# Patient Record
Sex: Male | Born: 1940 | Race: White | Hispanic: No | Marital: Married | Smoking: Former smoker
Health system: Southern US, Community
[De-identification: ages and names within clinical notes are randomized; demographics above are authoritative.]

## PROBLEM LIST (undated history)

## (undated) DIAGNOSIS — D649 Anemia, unspecified: Secondary | ICD-10-CM

## (undated) DIAGNOSIS — I482 Chronic atrial fibrillation, unspecified: Secondary | ICD-10-CM

## (undated) DIAGNOSIS — I2510 Atherosclerotic heart disease of native coronary artery without angina pectoris: Secondary | ICD-10-CM

## (undated) DIAGNOSIS — M545 Low back pain, unspecified: Secondary | ICD-10-CM

## (undated) DIAGNOSIS — I509 Heart failure, unspecified: Secondary | ICD-10-CM

## (undated) DIAGNOSIS — I255 Ischemic cardiomyopathy: Secondary | ICD-10-CM

## (undated) DIAGNOSIS — N289 Disorder of kidney and ureter, unspecified: Secondary | ICD-10-CM

## (undated) DIAGNOSIS — I739 Peripheral vascular disease, unspecified: Secondary | ICD-10-CM

## (undated) DIAGNOSIS — E785 Hyperlipidemia, unspecified: Secondary | ICD-10-CM

## (undated) DIAGNOSIS — T8859XA Other complications of anesthesia, initial encounter: Secondary | ICD-10-CM

## (undated) DIAGNOSIS — T4145XA Adverse effect of unspecified anesthetic, initial encounter: Secondary | ICD-10-CM

## (undated) DIAGNOSIS — R319 Hematuria, unspecified: Secondary | ICD-10-CM

## (undated) DIAGNOSIS — G4733 Obstructive sleep apnea (adult) (pediatric): Secondary | ICD-10-CM

## (undated) DIAGNOSIS — IMO0001 Reserved for inherently not codable concepts without codable children: Secondary | ICD-10-CM

## (undated) DIAGNOSIS — E119 Type 2 diabetes mellitus without complications: Secondary | ICD-10-CM

## (undated) DIAGNOSIS — Z9981 Dependence on supplemental oxygen: Secondary | ICD-10-CM

## (undated) DIAGNOSIS — K259 Gastric ulcer, unspecified as acute or chronic, without hemorrhage or perforation: Secondary | ICD-10-CM

## (undated) DIAGNOSIS — R251 Tremor, unspecified: Secondary | ICD-10-CM

## (undated) DIAGNOSIS — I10 Essential (primary) hypertension: Secondary | ICD-10-CM

## (undated) DIAGNOSIS — M1990 Unspecified osteoarthritis, unspecified site: Secondary | ICD-10-CM

## (undated) DIAGNOSIS — K219 Gastro-esophageal reflux disease without esophagitis: Secondary | ICD-10-CM

## (undated) DIAGNOSIS — G8929 Other chronic pain: Secondary | ICD-10-CM

## (undated) DIAGNOSIS — Z950 Presence of cardiac pacemaker: Secondary | ICD-10-CM

## (undated) DIAGNOSIS — I219 Acute myocardial infarction, unspecified: Secondary | ICD-10-CM

## (undated) DIAGNOSIS — R0989 Other specified symptoms and signs involving the circulatory and respiratory systems: Secondary | ICD-10-CM

## (undated) DIAGNOSIS — Z9989 Dependence on other enabling machines and devices: Secondary | ICD-10-CM

## (undated) DIAGNOSIS — R0602 Shortness of breath: Secondary | ICD-10-CM

## (undated) DIAGNOSIS — IMO0002 Reserved for concepts with insufficient information to code with codable children: Secondary | ICD-10-CM

## (undated) DIAGNOSIS — Z5189 Encounter for other specified aftercare: Secondary | ICD-10-CM

## (undated) DIAGNOSIS — J449 Chronic obstructive pulmonary disease, unspecified: Secondary | ICD-10-CM

## (undated) DIAGNOSIS — I442 Atrioventricular block, complete: Secondary | ICD-10-CM

## (undated) HISTORY — DX: Atrioventricular block, complete: I44.2

## (undated) HISTORY — DX: Ischemic cardiomyopathy: I25.5

## (undated) HISTORY — DX: Acute myocardial infarction, unspecified: I21.9

## (undated) HISTORY — PX: CORONARY ANGIOPLASTY: SHX604

## (undated) HISTORY — PX: CATARACT EXTRACTION W/ INTRAOCULAR LENS  IMPLANT, BILATERAL: SHX1307

## (undated) HISTORY — DX: Essential (primary) hypertension: I10

## (undated) HISTORY — DX: Hyperlipidemia, unspecified: E78.5

## (undated) HISTORY — DX: Other specified symptoms and signs involving the circulatory and respiratory systems: R09.89

## (undated) HISTORY — PX: CORONARY ANGIOPLASTY WITH STENT PLACEMENT: SHX49

## (undated) HISTORY — PX: CORONARY STENT PLACEMENT: SHX1402

## (undated) HISTORY — PX: TONSILLECTOMY: SUR1361

## (undated) HISTORY — PX: HEMORRHOID SURGERY: SHX153

## (undated) HISTORY — PX: BACK SURGERY: SHX140

## (undated) HISTORY — PX: LUMBAR DISC SURGERY: SHX700

## (undated) HISTORY — DX: Peripheral vascular disease, unspecified: I73.9

---
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
## 1979-03-27 DIAGNOSIS — K259 Gastric ulcer, unspecified as acute or chronic, without hemorrhage or perforation: Secondary | ICD-10-CM

## 1979-03-27 HISTORY — DX: Gastric ulcer, unspecified as acute or chronic, without hemorrhage or perforation: K25.9

## 1989-03-26 HISTORY — PX: LUMBAR LAMINECTOMY: SHX95

## 1998-02-11 ENCOUNTER — Inpatient Hospital Stay (HOSPITAL_COMMUNITY): Admission: EM | Admit: 1998-02-11 | Discharge: 1998-02-12 | Payer: Self-pay | Admitting: Emergency Medicine

## 1998-02-13 ENCOUNTER — Ambulatory Visit (HOSPITAL_COMMUNITY): Admission: RE | Admit: 1998-02-13 | Discharge: 1998-02-13 | Payer: Self-pay | Admitting: Cardiology

## 1998-02-17 ENCOUNTER — Observation Stay (HOSPITAL_COMMUNITY): Admission: AD | Admit: 1998-02-17 | Discharge: 1998-02-18 | Payer: Self-pay | Admitting: Cardiology

## 1998-05-21 ENCOUNTER — Ambulatory Visit (HOSPITAL_COMMUNITY): Admission: RE | Admit: 1998-05-21 | Discharge: 1998-05-21 | Payer: Self-pay | Admitting: Neurosurgery

## 1998-05-21 ENCOUNTER — Encounter: Payer: Self-pay | Admitting: Neurosurgery

## 1998-06-17 ENCOUNTER — Other Ambulatory Visit: Admission: RE | Admit: 1998-06-17 | Discharge: 1998-06-17 | Payer: Self-pay | Admitting: Gastroenterology

## 1998-07-29 ENCOUNTER — Encounter: Payer: Self-pay | Admitting: Neurosurgery

## 1998-07-31 ENCOUNTER — Encounter: Payer: Self-pay | Admitting: Neurosurgery

## 1998-07-31 ENCOUNTER — Inpatient Hospital Stay (HOSPITAL_COMMUNITY): Admission: RE | Admit: 1998-07-31 | Discharge: 1998-08-02 | Payer: Self-pay | Admitting: Neurosurgery

## 1999-03-24 ENCOUNTER — Encounter: Payer: Self-pay | Admitting: Cardiology

## 1999-03-24 ENCOUNTER — Ambulatory Visit (HOSPITAL_COMMUNITY): Admission: RE | Admit: 1999-03-24 | Discharge: 1999-03-24 | Payer: Self-pay | Admitting: Neurosurgery

## 1999-04-22 ENCOUNTER — Encounter: Admission: RE | Admit: 1999-04-22 | Discharge: 1999-07-21 | Payer: Self-pay | Admitting: Anesthesiology

## 1999-08-03 ENCOUNTER — Encounter: Admission: RE | Admit: 1999-08-03 | Discharge: 1999-10-30 | Payer: Self-pay | Admitting: Anesthesiology

## 1999-10-02 ENCOUNTER — Encounter: Admission: RE | Admit: 1999-10-02 | Discharge: 1999-10-02 | Payer: Self-pay | Admitting: Cardiology

## 1999-10-02 ENCOUNTER — Encounter: Payer: Self-pay | Admitting: Cardiology

## 1999-10-05 ENCOUNTER — Ambulatory Visit (HOSPITAL_COMMUNITY): Admission: RE | Admit: 1999-10-05 | Discharge: 1999-10-06 | Payer: Self-pay | Admitting: Cardiology

## 1999-10-14 ENCOUNTER — Encounter: Admission: RE | Admit: 1999-10-14 | Discharge: 2000-01-12 | Payer: Self-pay | Admitting: Internal Medicine

## 1999-10-30 ENCOUNTER — Encounter: Admission: RE | Admit: 1999-10-30 | Discharge: 2000-01-19 | Payer: Self-pay | Admitting: Anesthesiology

## 2000-01-19 ENCOUNTER — Encounter: Admission: RE | Admit: 2000-01-19 | Discharge: 2000-04-18 | Payer: Self-pay | Admitting: Internal Medicine

## 2000-01-22 ENCOUNTER — Encounter: Admission: RE | Admit: 2000-01-22 | Discharge: 2000-02-08 | Payer: Self-pay | Admitting: Anesthesiology

## 2000-04-27 ENCOUNTER — Encounter: Payer: Self-pay | Admitting: Cardiology

## 2000-04-27 ENCOUNTER — Encounter: Admission: RE | Admit: 2000-04-27 | Discharge: 2000-04-27 | Payer: Self-pay | Admitting: Cardiology

## 2000-05-02 ENCOUNTER — Ambulatory Visit (HOSPITAL_COMMUNITY): Admission: RE | Admit: 2000-05-02 | Discharge: 2000-05-02 | Payer: Self-pay | Admitting: Cardiology

## 2001-01-31 ENCOUNTER — Ambulatory Visit (HOSPITAL_COMMUNITY): Admission: RE | Admit: 2001-01-31 | Discharge: 2001-01-31 | Payer: Self-pay | Admitting: Neurosurgery

## 2001-01-31 ENCOUNTER — Encounter: Payer: Self-pay | Admitting: Neurosurgery

## 2001-07-03 ENCOUNTER — Encounter: Payer: Self-pay | Admitting: Emergency Medicine

## 2001-07-03 ENCOUNTER — Encounter (INDEPENDENT_AMBULATORY_CARE_PROVIDER_SITE_OTHER): Payer: Self-pay | Admitting: *Deleted

## 2001-07-03 ENCOUNTER — Inpatient Hospital Stay (HOSPITAL_COMMUNITY): Admission: EM | Admit: 2001-07-03 | Discharge: 2001-07-06 | Payer: Self-pay | Admitting: Emergency Medicine

## 2001-07-05 ENCOUNTER — Encounter: Payer: Self-pay | Admitting: Internal Medicine

## 2002-06-14 ENCOUNTER — Ambulatory Visit (HOSPITAL_BASED_OUTPATIENT_CLINIC_OR_DEPARTMENT_OTHER): Admission: RE | Admit: 2002-06-14 | Discharge: 2002-06-14 | Payer: Self-pay | Admitting: Orthopedic Surgery

## 2002-06-14 ENCOUNTER — Encounter (INDEPENDENT_AMBULATORY_CARE_PROVIDER_SITE_OTHER): Payer: Self-pay | Admitting: Specialist

## 2002-10-03 ENCOUNTER — Inpatient Hospital Stay (HOSPITAL_COMMUNITY): Admission: EM | Admit: 2002-10-03 | Discharge: 2002-10-07 | Payer: Self-pay | Admitting: Emergency Medicine

## 2002-10-03 ENCOUNTER — Encounter: Payer: Self-pay | Admitting: Emergency Medicine

## 2003-01-04 ENCOUNTER — Ambulatory Visit (HOSPITAL_COMMUNITY): Admission: RE | Admit: 2003-01-04 | Discharge: 2003-01-04 | Payer: Self-pay | Admitting: Cardiology

## 2003-03-13 ENCOUNTER — Encounter (INDEPENDENT_AMBULATORY_CARE_PROVIDER_SITE_OTHER): Payer: Self-pay | Admitting: *Deleted

## 2003-03-13 ENCOUNTER — Ambulatory Visit (HOSPITAL_COMMUNITY): Admission: RE | Admit: 2003-03-13 | Discharge: 2003-03-13 | Payer: Self-pay | Admitting: Gastroenterology

## 2003-07-01 ENCOUNTER — Encounter: Admission: RE | Admit: 2003-07-01 | Discharge: 2003-07-01 | Payer: Self-pay | Admitting: Cardiology

## 2003-07-04 ENCOUNTER — Ambulatory Visit (HOSPITAL_COMMUNITY): Admission: RE | Admit: 2003-07-04 | Discharge: 2003-07-04 | Payer: Self-pay | Admitting: Cardiology

## 2003-07-05 ENCOUNTER — Ambulatory Visit (HOSPITAL_COMMUNITY): Admission: RE | Admit: 2003-07-05 | Discharge: 2003-07-05 | Payer: Self-pay | Admitting: Cardiology

## 2004-09-11 ENCOUNTER — Ambulatory Visit (HOSPITAL_COMMUNITY): Admission: RE | Admit: 2004-09-11 | Discharge: 2004-09-11 | Payer: Self-pay | Admitting: Orthopedic Surgery

## 2004-11-11 ENCOUNTER — Inpatient Hospital Stay (HOSPITAL_COMMUNITY): Admission: EM | Admit: 2004-11-11 | Discharge: 2004-11-17 | Payer: Self-pay | Admitting: Emergency Medicine

## 2004-11-13 HISTORY — PX: TRANSESOPHAGEAL ECHOCARDIOGRAM: SHX273

## 2004-12-14 ENCOUNTER — Ambulatory Visit (HOSPITAL_COMMUNITY): Admission: RE | Admit: 2004-12-14 | Discharge: 2004-12-14 | Payer: Self-pay | Admitting: Cardiology

## 2006-07-21 ENCOUNTER — Inpatient Hospital Stay (HOSPITAL_COMMUNITY): Admission: EM | Admit: 2006-07-21 | Discharge: 2006-07-23 | Payer: Self-pay | Admitting: Emergency Medicine

## 2008-02-01 ENCOUNTER — Ambulatory Visit: Payer: Self-pay | Admitting: Emergency Medicine

## 2008-02-01 DIAGNOSIS — G4733 Obstructive sleep apnea (adult) (pediatric): Secondary | ICD-10-CM

## 2008-02-01 DIAGNOSIS — I10 Essential (primary) hypertension: Secondary | ICD-10-CM | POA: Insufficient documentation

## 2008-02-01 DIAGNOSIS — R0602 Shortness of breath: Secondary | ICD-10-CM

## 2008-02-01 DIAGNOSIS — E785 Hyperlipidemia, unspecified: Secondary | ICD-10-CM

## 2008-03-15 ENCOUNTER — Ambulatory Visit: Payer: Self-pay | Admitting: Emergency Medicine

## 2008-03-15 ENCOUNTER — Ambulatory Visit (HOSPITAL_BASED_OUTPATIENT_CLINIC_OR_DEPARTMENT_OTHER): Admission: RE | Admit: 2008-03-15 | Discharge: 2008-03-15 | Payer: Self-pay | Admitting: Orthopedic Surgery

## 2008-04-24 ENCOUNTER — Ambulatory Visit: Payer: Self-pay | Admitting: Emergency Medicine

## 2008-04-24 DIAGNOSIS — J439 Emphysema, unspecified: Secondary | ICD-10-CM

## 2008-07-11 ENCOUNTER — Encounter: Admission: RE | Admit: 2008-07-11 | Discharge: 2008-07-11 | Payer: Self-pay | Admitting: Endocrinology

## 2008-07-26 DIAGNOSIS — I442 Atrioventricular block, complete: Secondary | ICD-10-CM

## 2008-07-26 DIAGNOSIS — Z950 Presence of cardiac pacemaker: Secondary | ICD-10-CM

## 2008-07-26 HISTORY — DX: Atrioventricular block, complete: I44.2

## 2008-07-26 HISTORY — DX: Presence of cardiac pacemaker: Z95.0

## 2008-07-26 HISTORY — PX: INSERT / REPLACE / REMOVE PACEMAKER: SUR710

## 2008-07-30 ENCOUNTER — Inpatient Hospital Stay (HOSPITAL_COMMUNITY): Admission: EM | Admit: 2008-07-30 | Discharge: 2008-08-04 | Payer: Self-pay | Admitting: Emergency Medicine

## 2008-07-30 HISTORY — PX: CARDIAC CATHETERIZATION: SHX172

## 2008-09-02 ENCOUNTER — Ambulatory Visit (HOSPITAL_COMMUNITY): Admission: RE | Admit: 2008-09-02 | Discharge: 2008-09-02 | Payer: Self-pay | Admitting: Cardiology

## 2008-09-12 ENCOUNTER — Encounter (HOSPITAL_COMMUNITY): Admission: RE | Admit: 2008-09-12 | Discharge: 2008-12-11 | Payer: Self-pay | Admitting: Cardiology

## 2008-11-09 ENCOUNTER — Ambulatory Visit: Payer: Self-pay | Admitting: Family Medicine

## 2008-11-09 DIAGNOSIS — L03319 Cellulitis of trunk, unspecified: Secondary | ICD-10-CM

## 2008-11-09 DIAGNOSIS — L02219 Cutaneous abscess of trunk, unspecified: Secondary | ICD-10-CM | POA: Insufficient documentation

## 2008-11-29 ENCOUNTER — Emergency Department (HOSPITAL_COMMUNITY): Admission: EM | Admit: 2008-11-29 | Discharge: 2008-11-29 | Payer: Self-pay | Admitting: Emergency Medicine

## 2008-12-12 ENCOUNTER — Encounter (HOSPITAL_COMMUNITY): Admission: RE | Admit: 2008-12-12 | Discharge: 2009-02-06 | Payer: Self-pay | Admitting: Cardiology

## 2009-04-23 ENCOUNTER — Inpatient Hospital Stay (HOSPITAL_COMMUNITY): Admission: AD | Admit: 2009-04-23 | Discharge: 2009-05-03 | Payer: Self-pay | Admitting: Urology

## 2009-04-29 ENCOUNTER — Encounter: Payer: Self-pay | Admitting: Urology

## 2009-04-29 DIAGNOSIS — I509 Heart failure, unspecified: Secondary | ICD-10-CM

## 2009-04-29 HISTORY — DX: Heart failure, unspecified: I50.9

## 2009-05-15 ENCOUNTER — Encounter: Admission: RE | Admit: 2009-05-15 | Discharge: 2009-05-15 | Payer: Self-pay | Admitting: Endocrinology

## 2009-08-12 ENCOUNTER — Ambulatory Visit (HOSPITAL_COMMUNITY): Admission: RE | Admit: 2009-08-12 | Discharge: 2009-08-12 | Payer: Self-pay | Admitting: Endocrinology

## 2009-09-11 ENCOUNTER — Telehealth: Payer: Self-pay | Admitting: Emergency Medicine

## 2009-09-12 ENCOUNTER — Ambulatory Visit: Payer: Self-pay | Admitting: Emergency Medicine

## 2009-09-12 DIAGNOSIS — J9610 Chronic respiratory failure, unspecified whether with hypoxia or hypercapnia: Secondary | ICD-10-CM

## 2009-09-15 ENCOUNTER — Encounter: Payer: Self-pay | Admitting: Emergency Medicine

## 2009-09-17 ENCOUNTER — Encounter: Payer: Self-pay | Admitting: Emergency Medicine

## 2009-09-18 ENCOUNTER — Encounter: Payer: Self-pay | Admitting: Emergency Medicine

## 2009-10-06 ENCOUNTER — Encounter: Payer: Self-pay | Admitting: Emergency Medicine

## 2009-10-14 ENCOUNTER — Ambulatory Visit: Payer: Self-pay | Admitting: Emergency Medicine

## 2009-10-14 DIAGNOSIS — J310 Chronic rhinitis: Secondary | ICD-10-CM

## 2009-11-03 ENCOUNTER — Encounter: Payer: Self-pay | Admitting: Emergency Medicine

## 2009-11-06 ENCOUNTER — Telehealth (INDEPENDENT_AMBULATORY_CARE_PROVIDER_SITE_OTHER): Payer: Self-pay | Admitting: *Deleted

## 2009-12-15 ENCOUNTER — Encounter: Payer: Self-pay | Admitting: Emergency Medicine

## 2009-12-17 ENCOUNTER — Encounter: Payer: Self-pay | Admitting: Emergency Medicine

## 2010-01-07 ENCOUNTER — Telehealth: Payer: Self-pay | Admitting: Emergency Medicine

## 2010-02-04 DIAGNOSIS — J449 Chronic obstructive pulmonary disease, unspecified: Secondary | ICD-10-CM

## 2010-02-04 HISTORY — DX: Chronic obstructive pulmonary disease, unspecified: J44.9

## 2010-02-16 ENCOUNTER — Encounter: Admission: RE | Admit: 2010-02-16 | Discharge: 2010-02-16 | Payer: Self-pay | Admitting: Cardiology

## 2010-07-23 DIAGNOSIS — R0989 Other specified symptoms and signs involving the circulatory and respiratory systems: Secondary | ICD-10-CM

## 2010-07-23 HISTORY — DX: Other specified symptoms and signs involving the circulatory and respiratory systems: R09.89

## 2010-08-17 ENCOUNTER — Encounter: Payer: Self-pay | Admitting: Emergency Medicine

## 2010-08-25 NOTE — Progress Notes (Signed)
Summary: cpap  Phone Note Call from Patient   Caller: [email or web address]  Call For: byrum Summary of Call: dr Tamela Oddi nurse called [facility] and stated pt complaining for pressure too high wants to get an order for 3day auto cpap download  Initial call taken by: Oneita Jolly,  November 06, 2009 10:13 AM  Follow-up for Phone Call        This is OK with me. order the auto-titration x 2 weeks and have the data sent to me. Thanks.  Follow-up by: Leslye Peer MD,  November 06, 2009 3:28 PM  Additional Follow-up for Phone Call Additional follow up Details #1::        order faxed to [facility] for pt to be on auto cpap x2wks with download    lori will you put me an order in pc box thanks Additional Follow-up by: Oneita Jolly,  November 06, 2009 4:15 PM    Additional Follow-up for Phone Call Additional follow up Details #2::    Order sent to [facility] for auto set with download.Michel Bickers [facility]  November 06, 2009 4:19 PM

## 2010-08-25 NOTE — Letter (Signed)
Summary: CMN/[facility]  CMN/[facility]   Imported By: Lester [place] 09/19/2009 10:04:31  _____________________________________________________________________  External Attachment:    Type:   Image     Comment:   External Document

## 2010-08-25 NOTE — Assessment & Plan Note (Signed)
Summary: chronic resp failure, COPD, OSA   Visit Type:  Follow-up Copy to:  Dr. Julieanne Manson Primary Provider/Referring Provider:  Dr. Corrin Parker  CC:  COPD. Sleep apnea. The patient was last seen 04-24-2008. He c/o increased sob and cough with dark mucus. Worse x3-4 weeks. He says he is wearing his CPAP during the day and at night..  History of Present Illness: 70 yo man, hx CAD now s/p pacer 07/2008, tobacco abuse, DM, OSA on CPAP.    ROV 09/12/09 -- Progressive dyspnea over several months time.Hasn't been seen since 2009. We tried changing Spiriva to foradil at that time, he isn't sure whether it helped him, not sure how reliably he took either med. Has not restarted smoking. Was just started on Brovana and ipratropium by Dr Dagoberto Ligas last week. he was also started on O2 at all times ([facility]). Wt 304 -> 321 LBS.   Current Medications (verified): 1)  Furosemide 40 Mg  Tabs (Furosemide) .Marland Kitchen.. 1 By Mouth Daily 2)  Warfarin Sodium 10 Mg  Tabs (Warfarin Sodium) .... Use As Directed 3)  Lisinopril 20 Mg  Tabs (Lisinopril) .Marland Kitchen.. 1 By Mouth Daily 4)  Simvastatin 80 Mg  Tabs (Simvastatin) .Marland Kitchen.. 1 By Mouth Daily 5)  Actos 45 Mg  Tabs (Pioglitazone Hcl) .Marland Kitchen.. 1 By Mouth Daily 6)  Celexa 40 Mg  Tabs (Citalopram Hydrobromide) .Marland Kitchen.. 1 By Mouth Daily 7)  Novolog Mix 70/30 70-30 %  Susp (Insulin Aspart Prot & Aspart) .... As Directed 8)  Adult Aspirin Ec Low Strength 81 Mg  Tbec (Aspirin) .Marland Kitchen.. 1 By Mouth Daily 9)  Cpap .... At Bedtime 10)  Oxygen 1l .... At Bedtime With Cpap 11)  Isosorbide Dinitrate 30 Mg Tabs (Isosorbide Dinitrate) .Marland Kitchen.. 1 By Mouth Daily 12)  Spironolactone 25 Mg Tabs (Spironolactone) .Marland Kitchen.. 1 By Mouth Daily 13)  Oxybutynin Chloride 5 Mg Tabs (Oxybutynin Chloride) .... 2 By Mouth Daily 14)  Coreg 3.125 Mg Tabs (Carvedilol) .Marland Kitchen.. 1 By Mouth Two Times A Day 15)  Spiriva Handihaler 18 Mcg Caps (Tiotropium Bromide Monohydrate) .... Out 16)  Multivitamins  Tabs (Multiple Vitamin) .Marland Kitchen.. 1 By  Mouth Daily 17)  Primidone 50 Mg Tabs (Primidone) .Marland Kitchen.. 1 By Mouth Daily 18)  Brovana 15 Mcg/67ml Nebu (Arformoterol Tartrate) .... Two Times A Day 19)  Ipratropium Bromide 0.02 % Soln (Ipratropium Bromide) .... Two Times A Day  Allergies (verified): 1)  ! Pcn  Review of Systems       The patient complains of shortness of breath with activity, shortness of breath at rest, productive cough, non-productive cough, chest pain, weight change, and nasal congestion/difficulty breathing through nose.  The patient denies coughing up blood, irregular heartbeats, loss of appetite, abdominal pain, difficulty swallowing, sore throat, tooth/dental problems, headaches, and sneezing.    Vital Signs:  Patient profile:   70 year old male Height:      69.5 inches (176.53 cm) Weight:      321 pounds (145.91 kg) BMI:     46.89 O2 Sat:      84 % on 2 L/min Temp:     97.5 degrees F (36.39 degrees C) oral Pulse rate:   79 / minute BP sitting:   132 / 70  (left arm) Cuff size:   large  Vitals Entered By: Michel Bickers CMA (September 12, 2009 9:18 AM)  O2 Sat at Rest %:  84 O2 Flow:  2 L/min  O2 Sat Comments After resting about 5 minutes the patients sats were up  to 91% on 2 liters.Michel Bickers [facility]  September 12, 2009 9:29 AM   Impression & Recommendations:  Problem # 1:  COPD (ICD-496) Recently changed to nebs (prior BD use has been unreliably). Clarified regimen with him today O2 titration to ensure adequate dosing rov 1 month or as needed   Problem # 2:  SLEEP APNEA (ICD-780.57) Consider future CPAP retitration given wt gain and progressive signs   Problem # 3:  CHRONIC RESPIRATORY FAILURE (ICD-518.83)  Medications Added to Medication List This Visit: 1)  Isosorbide Dinitrate 30 Mg Tabs (Isosorbide dinitrate) .Marland Kitchen.. 1 by mouth daily 2)  Spironolactone 25 Mg Tabs (Spironolactone) .Marland Kitchen.. 1 by mouth daily 3)  Oxybutynin Chloride 5 Mg Tabs (Oxybutynin chloride) .... 2 by mouth daily 4)  Coreg 3.125 Mg  Tabs (Carvedilol) .Marland Kitchen.. 1 by mouth two times a day 5)  Spiriva Handihaler 18 Mcg Caps (Tiotropium bromide monohydrate) .... Out 6)  Multivitamins Tabs (Multiple vitamin) .Marland Kitchen.. 1 by mouth daily 7)  Primidone 50 Mg Tabs (Primidone) .Marland Kitchen.. 1 by mouth daily 8)  Brovana 15 Mcg/57ml Nebu (Arformoterol tartrate) .... Two times a day 9)  Ipratropium Bromide 0.02 % Soln (Ipratropium bromide) .... Two times a day  Other Orders: Est. Patient Level V (81191) DME Referral (DME)  Patient Instructions: 1)  Stop Spiriva 2)  Continue to take Brovana two times a day  3)  Start taking ipratropium nebulizer 4x a day. 4)  Start using albuterol nebulizer up to every 4 hours if needed for shortness of breath (new orders sent to [facility]) 5)  We will ask [facility] to walk you to insure that you are on enough oxygen when you are exerting yourself.  6)  Start doing nasal saline washes once daily  7)  Try decongestant medications that contain either bromphenerimine or chlorphenerimine (over-the-counter) 8)  Follow up with Dr Delton Coombes in 1 month or as needed.

## 2010-08-25 NOTE — Progress Notes (Signed)
Summary: cpap orders  Phone Note Call from Patient   Caller: [email or web address]  Call For: Bruce Taylor  Summary of Call: needs order for cpap pressure from download Initial call taken by: Oneita Jolly,  January 07, 2010 12:03 PM  Follow-up for Phone Call        Entered CPAP order. Leslye Peer MD  January 08, 2010 12:07 PM  Follow-up by: Leslye Peer MD,  January 08, 2010 12:07 PM  Additional Follow-up for Phone Call Additional follow up Details #1::        order faxed to [facility] for cpap download  Additional Follow-up by: Oneita Jolly,  January 08, 2010 12:53 PM

## 2010-08-25 NOTE — Medication Information (Signed)
Summary: [facility]   Imported By: Lester [place] 09/19/2009 10:03:11  _____________________________________________________________________  External Attachment:    Type:   Image     Comment:   External Document

## 2010-08-25 NOTE — Assessment & Plan Note (Signed)
Summary: COPD, OSA   Visit Type:  Follow-up Copy to:  Dr. Julieanne Manson Primary Provider/Referring Provider:  Dr. Corrin Parker  CC:  1 month followup on OSA COPD and Chronic Respitory Failure.  Pt states that his breathing is much improved on o2.  Pt left side back pain "around lung" x 1 month- pt states that pain occurs when he moves a certain way.  Pt is currently on doxycycline per Dr Clarene Duke- "sinus trouble"..  History of Present Illness: 70 yo man, hx CAD now s/p pacer 07/2008, tobacco abuse, DM, OSA on CPAP.    ROV 09/12/09 -- Progressive dyspnea over several months time.Hasn't been seen since 2009. We tried changing Spiriva to foradil at that time, he isn't sure whether it helped him, not sure how reliably he took either med. Has not restarted smoking. Was just started on Brovana and ipratropium by Dr Dagoberto Ligas last week. he was also started on O2 at all times ([facility]). Wt 304 -> 321 LBS.   ROV 10/14/09 -- returns for follow up today. Last time we clarified his BDs: atrovent nebs qid + brovana. We also had O2 titration - . Has also seen Dr Clarene Duke, had his actos stopped, same diuretics. He was also given doxy for ? sinusitis/broinchitis. Nasal drainage is now clear, no blood. Has been using nasal saline as needed. Didn't try the OTC decongestants. Has been using brovana + atrovent, feels better. Good compliance with CPAP.   Current Medications (verified): 1)  Furosemide 40 Mg  Tabs (Furosemide) .Marland Kitchen.. 1 By Mouth Daily 2)  Warfarin Sodium 10 Mg  Tabs (Warfarin Sodium) .... Use As Directed 3)  Lisinopril 20 Mg  Tabs (Lisinopril) .Marland Kitchen.. 1 By Mouth Daily 4)  Simvastatin 80 Mg  Tabs (Simvastatin) .Marland Kitchen.. 1 By Mouth Daily 5)  Celexa 40 Mg  Tabs (Citalopram Hydrobromide) .Marland Kitchen.. 1 By Mouth Daily 6)  Novolog Mix 70/30 70-30 %  Susp (Insulin Aspart Prot & Aspart) .... As Directed 7)  Adult Aspirin Ec Low Strength 81 Mg  Tbec (Aspirin) .Marland Kitchen.. 1 By Mouth Daily 8)  Cpap .... At Bedtime 9)  Oxygen 1l .... At  Bedtime With Cpap 10)  Isosorbide Dinitrate 30 Mg Tabs (Isosorbide Dinitrate) .Marland Kitchen.. 1 By Mouth Daily 11)  Spironolactone 25 Mg Tabs (Spironolactone) .Marland Kitchen.. 1 By Mouth Daily 12)  Oxybutynin Chloride 5 Mg Tabs (Oxybutynin Chloride) .... 2 By Mouth Daily 13)  Coreg 3.125 Mg Tabs (Carvedilol) .Marland Kitchen.. 1 By Mouth Two Times A Day 14)  Multivitamins  Tabs (Multiple Vitamin) .Marland Kitchen.. 1 By Mouth Daily 15)  Primidone 50 Mg Tabs (Primidone) .Marland Kitchen.. 1 By Mouth Daily 16)  Brovana 15 Mcg/72ml Nebu (Arformoterol Tartrate) .... Two Times A Day 17)  Ipratropium Bromide 0.02 % Soln (Ipratropium Bromide) .... Two Times A Day  Allergies (verified): 1)  ! Pcn  Vital Signs:  Patient profile:   70 year old male Weight:      311 pounds O2 Sat:      96 % on 2 L/min Temp:     97.4 degrees F oral Pulse rate:   65 / minute BP sitting:   126 / 74  (left arm) Cuff size:   large  Vitals Entered By: Vernie Murders (October 14, 2009 3:26 PM)  O2 Flow:  2 L/min  Physical Exam  General:  well developed, obese, well nourished, in no acute distress Head:  normocephalic and atraumatic Eyes:  conjunctiva and sclera clear Nose:  no deformity, discharge, inflammation, or lesions Mouth:  no  deformity or lesions Neck:  no masses, thyromegaly, or abnormal cervical nodes Lungs:  no wheeze, no crackles Heart:  regular rate and rhythm, S1, S2 without murmurs, rubs, gallops, or clicks Abdomen:  protuberant, soft, NT, positive BS Msk:  no deformity or scoliosis noted with normal posture Extremities:  trace LE edema (better) Neurologic:  non-focal Skin:  somewhat ruddy complection, intact without lesions or rashes Psych:  alert and cooperative; normal mood and affect; normal attention span and concentration   Impression & Recommendations:  Problem # 1:  COPD (ICD-496) same BDs O2  Problem # 2:  SLEEP APNEA (ICD-780.57) Will ask [facility] to eval his CPAP fit.  Orders: Est. Patient Level III (16109) DME Referral (DME)  Problem #  3:  CHRONIC RHINITIS (ICD-472.0) nasal saline washes  Patient Instructions: 1)  Continue your nebulized medications as you are using them 2)  Continue nasal saline washes every day 3)  We will ask [facility] to check your CPAP, both to see if your mask fits well and to make sure you don't need repairs. It may be time for you to get a new mask.  4)  Follow up with Dr Delton Coombes in 3 months or as needed

## 2010-08-25 NOTE — Progress Notes (Signed)
Summary: tight chest/ breathing treatments  Phone Note Call from Patient   Caller: Patient Call For: byrum Summary of Call: pt's spouse marilyn requests to speak to nurse re: pt's tightness in chest/ SOB. says that pt's dr Leonette Most gegick prescribed breathing treatments for pt through [facility]. these are causing pt to become aggitated. i asked if she wanted to make an appt for pt w/ rb, but she wants to speak to nurse. i also advised that she may want to call dr Jacqulyn Liner (who pt sees for diabetes per spouse) since he prescribed the breathing treatments. pt also uses a cpap. marilyn Mineer [phone] Initial call taken by: Tivis Ringer, CNA,  September 11, 2009 10:34 AM  Follow-up for Phone Call        Pt spouse states that the pt is relly going "down hill." He has been placed on Oxygen 24/7 and brovana and pulmicort nebs all by Dr. Dagoberto Ligas, pt endocrinologists. Pt is aso on Cpap machine for apnea. Sh estates the breahting treatments aren't helping they are only making him agitated. I advised pt has not been seen since 2009 by Dr. Delton Coombes and he needs an appt to get re-established and to let RB treat the pt lung disease. Pt scheduled for 9am tomorrow. [name] aware.  Carron Curie CMA  September 11, 2009 11:32 AM

## 2010-08-25 NOTE — Letter (Signed)
Summary: CMN for Oxygen/[facility]  CMN for Oxygen/[facility]   Imported By: Sherian Rein 10/08/2009 13:41:15  _____________________________________________________________________  External Attachment:    Type:   Image     Comment:   External Document

## 2010-08-25 NOTE — Letter (Signed)
Summary: CMN/[facility]  CMN/[facility]   Imported By: Lester [place] 09/25/2009 09:42:51  _____________________________________________________________________  External Attachment:    Type:   Image     Comment:   External Document

## 2010-08-25 NOTE — Letter (Signed)
Summary: Revised CMN  Revised CMN   Imported By: Esmeralda Links D'jimraou 11/08/2009 11:24:01  _____________________________________________________________________  External Attachment:    Type:   Image     Comment:   External Document

## 2010-08-27 NOTE — Letter (Signed)
Summary: CMN for oxygen/[facility]  CMN for oxygen/[facility]   Imported By: Sherian Rein 08/20/2010 15:08:59  _____________________________________________________________________  External Attachment:    Type:   Image     Comment:   External Document

## 2010-09-02 ENCOUNTER — Encounter: Payer: Self-pay | Admitting: Emergency Medicine

## 2010-09-22 NOTE — Letter (Signed)
Summary: CMN/[facility]  CMN/[facility]   Imported By: Lester [place] 09/14/2010 09:33:29  _____________________________________________________________________  External Attachment:    Type:   Image     Comment:   External Document

## 2010-09-23 ENCOUNTER — Encounter: Payer: Self-pay | Admitting: Emergency Medicine

## 2010-10-06 NOTE — Medication Information (Signed)
Summary: Order for Nebulizer Supplies/[facility]  Order for Nebulizer Supplies/[facility]   Imported By: Lennie Odor 09/28/2010 13:49:30  _____________________________________________________________________  External Attachment:    Type:   Image     Comment:   External Document

## 2010-10-24 ENCOUNTER — Inpatient Hospital Stay (HOSPITAL_COMMUNITY)
Admission: EM | Admit: 2010-10-24 | Discharge: 2010-10-28 | DRG: 249 | Disposition: A | Discharge status: Home / Self Care | Payer: Medicare Other | Attending: Cardiology | Admitting: Cardiology

## 2010-10-24 ENCOUNTER — Emergency Department (HOSPITAL_COMMUNITY): Payer: Medicare Other

## 2010-10-24 DIAGNOSIS — E119 Type 2 diabetes mellitus without complications: Secondary | ICD-10-CM | POA: Diagnosis present

## 2010-10-24 DIAGNOSIS — E785 Hyperlipidemia, unspecified: Secondary | ICD-10-CM | POA: Diagnosis present

## 2010-10-24 DIAGNOSIS — I2510 Atherosclerotic heart disease of native coronary artery without angina pectoris: Secondary | ICD-10-CM | POA: Diagnosis present

## 2010-10-24 DIAGNOSIS — I4891 Unspecified atrial fibrillation: Secondary | ICD-10-CM | POA: Diagnosis present

## 2010-10-24 DIAGNOSIS — Z9981 Dependence on supplemental oxygen: Secondary | ICD-10-CM

## 2010-10-24 DIAGNOSIS — J4489 Other specified chronic obstructive pulmonary disease: Secondary | ICD-10-CM | POA: Diagnosis present

## 2010-10-24 DIAGNOSIS — I129 Hypertensive chronic kidney disease with stage 1 through stage 4 chronic kidney disease, or unspecified chronic kidney disease: Secondary | ICD-10-CM | POA: Diagnosis present

## 2010-10-24 DIAGNOSIS — Z950 Presence of cardiac pacemaker: Secondary | ICD-10-CM

## 2010-10-24 DIAGNOSIS — M79609 Pain in unspecified limb: Secondary | ICD-10-CM

## 2010-10-24 DIAGNOSIS — G4733 Obstructive sleep apnea (adult) (pediatric): Secondary | ICD-10-CM | POA: Diagnosis present

## 2010-10-24 DIAGNOSIS — J449 Chronic obstructive pulmonary disease, unspecified: Secondary | ICD-10-CM | POA: Diagnosis present

## 2010-10-24 DIAGNOSIS — N183 Chronic kidney disease, stage 3 unspecified: Secondary | ICD-10-CM | POA: Diagnosis present

## 2010-10-24 DIAGNOSIS — I200 Unstable angina: Principal | ICD-10-CM | POA: Diagnosis present

## 2010-10-24 DIAGNOSIS — Z880 Allergy status to penicillin: Secondary | ICD-10-CM

## 2010-10-24 LAB — URINALYSIS, ROUTINE W REFLEX MICROSCOPIC
Hgb urine dipstick: NEGATIVE
Protein, ur: NEGATIVE mg/dL
Urobilinogen, UA: 0.2 mg/dL (ref 0.0–1.0)

## 2010-10-24 LAB — COMPREHENSIVE METABOLIC PANEL
BUN: 37 mg/dL — ABNORMAL HIGH (ref 6–23)
CO2: 23 mEq/L (ref 19–32)
Chloride: 104 mEq/L (ref 96–112)
Creatinine, Ser: 1.79 mg/dL — ABNORMAL HIGH (ref 0.4–1.5)
GFR calc non Af Amer: 38 mL/min — ABNORMAL LOW (ref >60)
Total Bilirubin: 0.4 mg/dL (ref 0.3–1.2)

## 2010-10-24 LAB — CBC
HCT: 37.2 % — ABNORMAL LOW (ref 39.0–52.0)
Hemoglobin: 12.3 g/dL — ABNORMAL LOW (ref 13.0–17.0)
MCH: 29.6 pg (ref 26.0–34.0)
MCH: 29.9 pg (ref 26.0–34.0)
RBC: 3.95 MIL/uL — ABNORMAL LOW (ref 4.22–5.81)
RDW: 14.7 % (ref 11.5–15.5)
WBC: 5.8 10*3/uL (ref 4.0–10.5)
WBC: 6.3 10*3/uL (ref 4.0–10.5)

## 2010-10-24 LAB — BASIC METABOLIC PANEL
BUN: 37 mg/dL — ABNORMAL HIGH (ref 6–23)
Calcium: 8.9 mg/dL (ref 8.4–10.5)
Creatinine, Ser: 1.88 mg/dL — ABNORMAL HIGH (ref 0.4–1.5)

## 2010-10-24 LAB — PROTIME-INR
INR: 2.3 — ABNORMAL HIGH (ref 0.00–1.49)
Prothrombin Time: 25.4 seconds — ABNORMAL HIGH (ref 11.6–15.2)

## 2010-10-24 LAB — TROPONIN I
Troponin I: 0.02 ng/mL (ref 0.00–0.06)
Troponin I: 0.04 ng/mL (ref 0.00–0.06)

## 2010-10-24 LAB — GLUCOSE, CAPILLARY: Glucose-Capillary: 212 mg/dL — ABNORMAL HIGH (ref 70–99)

## 2010-10-24 LAB — CK TOTAL AND CKMB (NOT AT ARMC)
CK, MB: 2 ng/mL (ref 0.3–4.0)
Relative Index: 1.5 (ref 0.0–2.5)

## 2010-10-24 LAB — DIFFERENTIAL
Eosinophils Relative: 3 % (ref 0–5)
Monocytes Relative: 13 % — ABNORMAL HIGH (ref 3–12)
Neutro Abs: 3.6 10*3/uL (ref 1.7–7.7)
Neutrophils Relative %: 57 % (ref 43–77)

## 2010-10-24 LAB — HEMOGLOBIN A1C: Hgb A1c MFr Bld: 7.1 % — ABNORMAL HIGH (ref <5.7)

## 2010-10-24 LAB — TSH: TSH: 1.549 u[IU]/mL (ref 0.350–4.500)

## 2010-10-25 HISTORY — PX: CORONARY STENT PLACEMENT: SHX1402

## 2010-10-25 LAB — CARDIAC PANEL(CRET KIN+CKTOT+MB+TROPI): Troponin I: 0.02 ng/mL (ref 0.00–0.06)

## 2010-10-25 LAB — PROTIME-INR: INR: 2.01 — ABNORMAL HIGH (ref 0.00–1.49)

## 2010-10-25 LAB — HEPARIN LEVEL (UNFRACTIONATED): Heparin Unfractionated: 0.41 IU/mL (ref 0.30–0.70)

## 2010-10-25 LAB — BASIC METABOLIC PANEL
BUN: 30 mg/dL — ABNORMAL HIGH (ref 6–23)
Chloride: 102 mEq/L (ref 96–112)
Creatinine, Ser: 1.53 mg/dL — ABNORMAL HIGH (ref 0.4–1.5)
Glucose, Bld: 54 mg/dL — ABNORMAL LOW (ref 70–99)

## 2010-10-25 LAB — GLUCOSE, CAPILLARY
Glucose-Capillary: 160 mg/dL — ABNORMAL HIGH (ref 70–99)
Glucose-Capillary: 135 mg/dL — ABNORMAL HIGH (ref 70–99)
Glucose-Capillary: 133 mg/dL — ABNORMAL HIGH (ref 70–99)

## 2010-10-26 HISTORY — PX: CARDIAC CATHETERIZATION: SHX172

## 2010-10-26 LAB — PROTIME-INR
INR: 1.53 — ABNORMAL HIGH (ref 0.00–1.49)
Prothrombin Time: 18.6 seconds — ABNORMAL HIGH (ref 11.6–15.2)

## 2010-10-26 LAB — BASIC METABOLIC PANEL
Calcium: 9 mg/dL (ref 8.4–10.5)
Creatinine, Ser: 1.38 mg/dL (ref 0.4–1.5)
GFR calc Af Amer: >60 mL/min (ref >60)
GFR calc non Af Amer: 51 mL/min — ABNORMAL LOW (ref >60)
Sodium: 134 mEq/L — ABNORMAL LOW (ref 135–145)

## 2010-10-26 LAB — CBC
Hemoglobin: 11.9 g/dL — ABNORMAL LOW (ref 13.0–17.0)
Platelets: 187 10*3/uL (ref 150–400)
RBC: 4.11 MIL/uL — ABNORMAL LOW (ref 4.22–5.81)
WBC: 8 10*3/uL (ref 4.0–10.5)

## 2010-10-26 LAB — GLUCOSE, CAPILLARY

## 2010-10-27 HISTORY — PX: CARDIAC CATHETERIZATION: SHX172

## 2010-10-27 LAB — CBC
Hemoglobin: 13.1 g/dL (ref 13.0–17.0)
RBC: 4.39 MIL/uL (ref 4.22–5.81)
WBC: 6.6 10*3/uL (ref 4.0–10.5)

## 2010-10-27 LAB — BASIC METABOLIC PANEL
CO2: 24 mEq/L (ref 19–32)
Glucose, Bld: 83 mg/dL (ref 70–99)
Potassium: 4.5 mEq/L (ref 3.5–5.1)
Sodium: 134 mEq/L — ABNORMAL LOW (ref 135–145)

## 2010-10-27 LAB — GLUCOSE, CAPILLARY
Glucose-Capillary: 105 mg/dL — ABNORMAL HIGH (ref 70–99)
Glucose-Capillary: 90 mg/dL (ref 70–99)
Glucose-Capillary: 108 mg/dL — ABNORMAL HIGH (ref 70–99)
Glucose-Capillary: 152 mg/dL — ABNORMAL HIGH (ref 70–99)

## 2010-10-27 LAB — HEPARIN LEVEL (UNFRACTIONATED): Heparin Unfractionated: <0.1 IU/mL — ABNORMAL LOW (ref 0.30–0.70)

## 2010-10-28 LAB — CBC
HCT: 37.2 % — ABNORMAL LOW (ref 39.0–52.0)
Hemoglobin: 12.1 g/dL — ABNORMAL LOW (ref 13.0–17.0)
MCH: 28.9 pg (ref 26.0–34.0)
MCHC: 32.5 g/dL (ref 30.0–36.0)
MCV: 89 fL (ref 78.0–100.0)
Platelets: 180 10*3/uL (ref 150–400)
RBC: 4.18 MIL/uL — ABNORMAL LOW (ref 4.22–5.81)
RDW: 14.5 % (ref 11.5–15.5)
WBC: 7.4 10*3/uL (ref 4.0–10.5)

## 2010-10-28 LAB — GLUCOSE, CAPILLARY
Glucose-Capillary: 115 mg/dL — ABNORMAL HIGH (ref 70–99)
Glucose-Capillary: 54 mg/dL — ABNORMAL LOW (ref 70–99)
Glucose-Capillary: 76 mg/dL (ref 70–99)
Glucose-Capillary: 184 mg/dL — ABNORMAL HIGH (ref 70–99)

## 2010-10-28 LAB — PROTIME-INR
INR: 1.11 (ref 0.00–1.49)
Prothrombin Time: 14.5 seconds (ref 11.6–15.2)

## 2010-10-28 LAB — BASIC METABOLIC PANEL
CO2: 22 mEq/L (ref 19–32)
Calcium: 8.9 mg/dL (ref 8.4–10.5)
Chloride: 103 mEq/L (ref 96–112)
Creatinine, Ser: 1.51 mg/dL — ABNORMAL HIGH (ref 0.4–1.5)
Glucose, Bld: 200 mg/dL — ABNORMAL HIGH (ref 70–99)
Sodium: 134 mEq/L — ABNORMAL LOW (ref 135–145)

## 2010-10-28 NOTE — Procedures (Signed)
  NAME:  Bruce Taylor, Bruce Taylor NO.:  [id number]  MEDICAL RECORD NO.:  [id number]           PATIENT TYPE:  I  LOCATION:  2916                         FACILITY:  [facility]  PHYSICIAN:  [place] Bluford Sedler, MD         DATE OF BIRTH:  1940/11/21  DATE OF PROCEDURE:  10/26/2010 DATE OF DISCHARGE:                           CARDIAC CATHETERIZATION   PROCEDURE:  Left heart catheterization.  OPERATOR:  [place] Shonda Mandarino, MD  INDICATIONS:  Chest pain.  PAST MEDICAL HISTORY:  Mr. Pellecchia is a 70 year old male with a history of multiple PCI in the past including stents to the circumflex and proximal OM1 as well as the proximal LAD and right coronary artery. He presented with ongoing chest pain relieved somewhat with nitroglycerin.  He was, therefore, referred for left heart catheterization.  PROCEDURE:  The patient was brought into the cardiac catheterization lab, sterilely prepped and draped in the usual fashion.  The area around the right radial artery was identified and anesthetized with 5 mL of 1% lidocaine.  After procedural time-out and radiation safety time-out, that area was anesthetized and the right radial artery was accessed with a straight needle and wire, after which a 5-French radial access catheter was placed.  Subsequently, the left and right coronary arteries were catheterized with a 5-French TIG 4.0 catheter.  The patient was sedated with 2 mg of Versed and 50 mcg of fentanyl.  There were no acute complications.  The patient also received 8000 units of heparin and 10 mL of radial cocktail to prevent spasm.  FINDINGS: 1. Left main - no disease. 2. LAD - patent proximal stent. 3. Left circumflex - patent proximal stent. 4. OM1 - patent proximal stent. 5. RCA - discrete 95% proximal in-stent restenosis, mid vessel 80%     discrete stenosis.  IMPRESSION: 1. Sequential severe right coronary artery lesions x2. 2. Patent left system and stents.  PLAN:  Discussed the case  with Dr. Herbie Baltimore.  Given his symptoms of unstable angina and significant right coronary stenosis, we will plan on percutaneous intervention.  He is on chronic Coumadin therapy and we wish to avoid multiple anticoagulants and a bare-metal stent would be preferred.  However, if he has had some in-stent restenosis, the optimal strategy would be a drug-eluting stent.     [place] Fate Galanti, MD     CH/MEDQ  D:  10/26/2010  T:  10/27/2010  Job:  [id number]  cc:   Thereasa Solo. Little, M.D. Alfonse Alpers. Dagoberto Ligas, M.D.  Electronically Signed by Kirtland Bouchard. Yeriel Mineo M.D. on 10/28/2010 08:57:15 AM

## 2010-10-29 LAB — CBC
HCT: 26.1 % — ABNORMAL LOW (ref 39.0–52.0)
HCT: 29.8 % — ABNORMAL LOW (ref 39.0–52.0)
HCT: 30.5 % — ABNORMAL LOW (ref 39.0–52.0)
HCT: 31.8 % — ABNORMAL LOW (ref 39.0–52.0)
Hemoglobin: 10.4 g/dL — ABNORMAL LOW (ref 13.0–17.0)
Hemoglobin: 10.4 g/dL — ABNORMAL LOW (ref 13.0–17.0)
Hemoglobin: 9.7 g/dL — ABNORMAL LOW (ref 13.0–17.0)
MCHC: 33.1 g/dL (ref 30.0–36.0)
MCHC: 33.5 g/dL (ref 30.0–36.0)
MCHC: 34.1 g/dL (ref 30.0–36.0)
MCHC: 34.5 g/dL (ref 30.0–36.0)
MCV: 90.6 fL (ref 78.0–100.0)
MCV: 91.1 fL (ref 78.0–100.0)
Platelets: 177 10*3/uL (ref 150–400)
Platelets: 195 10*3/uL (ref 150–400)
Platelets: 201 10*3/uL (ref 150–400)
Platelets: 219 10*3/uL (ref 150–400)
RBC: 2.88 MIL/uL — ABNORMAL LOW (ref 4.22–5.81)
RBC: 3.19 MIL/uL — ABNORMAL LOW (ref 4.22–5.81)
RBC: 3.25 MIL/uL — ABNORMAL LOW (ref 4.22–5.81)
RDW: 15 % (ref 11.5–15.5)
RDW: 15.2 % (ref 11.5–15.5)
RDW: 15.3 % (ref 11.5–15.5)
WBC: 7.5 10*3/uL (ref 4.0–10.5)
WBC: 7.6 10*3/uL (ref 4.0–10.5)
WBC: 8.1 10*3/uL (ref 4.0–10.5)
WBC: 8.3 10*3/uL (ref 4.0–10.5)
WBC: 8.9 10*3/uL (ref 4.0–10.5)

## 2010-10-29 LAB — CROSSMATCH: Antibody Screen: NEGATIVE

## 2010-10-29 LAB — COMPREHENSIVE METABOLIC PANEL
ALT: 27 U/L (ref 0–53)
AST: 36 U/L (ref 0–37)
AST: 48 U/L — ABNORMAL HIGH (ref 0–37)
Albumin: 3.1 g/dL — ABNORMAL LOW (ref 3.5–5.2)
Albumin: 3.4 g/dL — ABNORMAL LOW (ref 3.5–5.2)
BUN: 17 mg/dL (ref 6–23)
CO2: 30 mEq/L (ref 19–32)
Calcium: 9.4 mg/dL (ref 8.4–10.5)
Chloride: 100 mEq/L (ref 96–112)
Creatinine, Ser: 1.36 mg/dL (ref 0.4–1.5)
GFR calc Af Amer: 48 mL/min — ABNORMAL LOW (ref >60)
GFR calc Af Amer: >60 mL/min (ref >60)
GFR calc non Af Amer: 39 mL/min — ABNORMAL LOW (ref >60)
Potassium: 4.4 mEq/L (ref 3.5–5.1)
Sodium: 137 mEq/L (ref 135–145)
Total Bilirubin: 0.9 mg/dL (ref 0.3–1.2)
Total Protein: 6.6 g/dL (ref 6.0–8.3)

## 2010-10-29 LAB — BASIC METABOLIC PANEL
BUN: 10 mg/dL (ref 6–23)
BUN: 21 mg/dL (ref 6–23)
BUN: 30 mg/dL — ABNORMAL HIGH (ref 6–23)
CO2: 21 mEq/L (ref 19–32)
CO2: 26 mEq/L (ref 19–32)
CO2: 27 mEq/L (ref 19–32)
Calcium: 8.3 mg/dL — ABNORMAL LOW (ref 8.4–10.5)
Calcium: 8.8 mg/dL (ref 8.4–10.5)
Chloride: 102 mEq/L (ref 96–112)
Chloride: 102 mEq/L (ref 96–112)
Creatinine, Ser: 1.43 mg/dL (ref 0.4–1.5)
GFR calc Af Amer: 26 mL/min — ABNORMAL LOW (ref >60)
GFR calc Af Amer: 49 mL/min — ABNORMAL LOW (ref >60)
GFR calc non Af Amer: 40 mL/min — ABNORMAL LOW (ref >60)
GFR calc non Af Amer: 49 mL/min — ABNORMAL LOW (ref >60)
Glucose, Bld: 123 mg/dL — ABNORMAL HIGH (ref 70–99)
Glucose, Bld: 141 mg/dL — ABNORMAL HIGH (ref 70–99)
Potassium: 4.8 mEq/L (ref 3.5–5.1)
Sodium: 130 mEq/L — ABNORMAL LOW (ref 135–145)
Sodium: 135 mEq/L (ref 135–145)

## 2010-10-29 LAB — BLOOD GAS, ARTERIAL
Acid-Base Excess: 1.5 mmol/L (ref 0.0–2.0)
Acid-Base Excess: 2.1 mmol/L — ABNORMAL HIGH (ref 0.0–2.0)
Drawn by: 308031
O2 Content: 3 L/min
O2 Saturation: 94.4 %
Patient temperature: 98.6
pCO2 arterial: 33.2 mmHg — ABNORMAL LOW (ref 35.0–45.0)
pCO2 arterial: 33.7 mmHg — ABNORMAL LOW (ref 35.0–45.0)
pH, Arterial: 7.489 — ABNORMAL HIGH (ref 7.350–7.450)
pO2, Arterial: 104 mmHg — ABNORMAL HIGH (ref 80.0–100.0)

## 2010-10-29 LAB — GLUCOSE, CAPILLARY
Glucose-Capillary: 112 mg/dL — ABNORMAL HIGH (ref 70–99)
Glucose-Capillary: 116 mg/dL — ABNORMAL HIGH (ref 70–99)
Glucose-Capillary: 116 mg/dL — ABNORMAL HIGH (ref 70–99)
Glucose-Capillary: 123 mg/dL — ABNORMAL HIGH (ref 70–99)
Glucose-Capillary: 124 mg/dL — ABNORMAL HIGH (ref 70–99)
Glucose-Capillary: 125 mg/dL — ABNORMAL HIGH (ref 70–99)
Glucose-Capillary: 130 mg/dL — ABNORMAL HIGH (ref 70–99)
Glucose-Capillary: 133 mg/dL — ABNORMAL HIGH (ref 70–99)
Glucose-Capillary: 134 mg/dL — ABNORMAL HIGH (ref 70–99)
Glucose-Capillary: 135 mg/dL — ABNORMAL HIGH (ref 70–99)
Glucose-Capillary: 135 mg/dL — ABNORMAL HIGH (ref 70–99)
Glucose-Capillary: 135 mg/dL — ABNORMAL HIGH (ref 70–99)
Glucose-Capillary: 137 mg/dL — ABNORMAL HIGH (ref 70–99)
Glucose-Capillary: 140 mg/dL — ABNORMAL HIGH (ref 70–99)
Glucose-Capillary: 141 mg/dL — ABNORMAL HIGH (ref 70–99)
Glucose-Capillary: 143 mg/dL — ABNORMAL HIGH (ref 70–99)
Glucose-Capillary: 147 mg/dL — ABNORMAL HIGH (ref 70–99)
Glucose-Capillary: 148 mg/dL — ABNORMAL HIGH (ref 70–99)
Glucose-Capillary: 148 mg/dL — ABNORMAL HIGH (ref 70–99)
Glucose-Capillary: 155 mg/dL — ABNORMAL HIGH (ref 70–99)
Glucose-Capillary: 158 mg/dL — ABNORMAL HIGH (ref 70–99)
Glucose-Capillary: 168 mg/dL — ABNORMAL HIGH (ref 70–99)
Glucose-Capillary: 181 mg/dL — ABNORMAL HIGH (ref 70–99)

## 2010-10-29 LAB — PROTIME-INR
INR: 1.34 (ref 0.00–1.49)
INR: 1.4 (ref 0.00–1.49)
INR: 1.45 (ref 0.00–1.49)
INR: 1.91 — ABNORMAL HIGH (ref 0.00–1.49)
Prothrombin Time: 16.5 seconds — ABNORMAL HIGH (ref 11.6–15.2)
Prothrombin Time: 17 seconds — ABNORMAL HIGH (ref 11.6–15.2)
Prothrombin Time: 21.7 seconds — ABNORMAL HIGH (ref 11.6–15.2)

## 2010-10-29 LAB — DIFFERENTIAL
Eosinophils Relative: 2 % (ref 0–5)
Lymphocytes Relative: 17 % (ref 12–46)
Monocytes Absolute: 0.8 10*3/uL (ref 0.1–1.0)
Monocytes Relative: 9 % (ref 3–12)
Neutro Abs: 5.9 10*3/uL (ref 1.7–7.7)

## 2010-10-29 LAB — HEPATIC FUNCTION PANEL
Alkaline Phosphatase: 38 U/L — ABNORMAL LOW (ref 39–117)
Bilirubin, Direct: 0.1 mg/dL (ref 0.0–0.3)
Indirect Bilirubin: 0.6 mg/dL (ref 0.3–0.9)
Total Protein: 6.7 g/dL (ref 6.0–8.3)

## 2010-10-29 LAB — RPR: RPR Ser Ql: NONREACTIVE

## 2010-10-29 LAB — PREPARE FRESH FROZEN PLASMA

## 2010-10-29 LAB — IRON AND TIBC: UIBC: 242 ug/dL

## 2010-10-29 LAB — CREATININE, URINE, RANDOM: Creatinine, Urine: 58.8 mg/dL

## 2010-10-29 LAB — RAPID URINE DRUG SCREEN, HOSP PERFORMED
Amphetamines: NOT DETECTED
Barbiturates: NOT DETECTED
Tetrahydrocannabinol: NOT DETECTED

## 2010-10-29 LAB — T3, FREE: T3, Free: 2.2 pg/mL — ABNORMAL LOW (ref 2.3–4.2)

## 2010-10-29 LAB — HEMOGLOBIN A1C: Hgb A1c MFr Bld: 7.5 % — ABNORMAL HIGH (ref 4.6–6.1)

## 2010-10-29 LAB — T4, FREE: Free T4: 1.53 ng/dL (ref 0.80–1.80)

## 2010-10-29 LAB — AMMONIA: Ammonia: 26 umol/L (ref 11–35)

## 2010-10-29 LAB — VITAMIN B12: Vitamin B-12: 1340 pg/mL — ABNORMAL HIGH (ref 211–911)

## 2010-10-29 LAB — TSH: TSH: 1.588 u[IU]/mL (ref 0.350–4.500)

## 2010-10-29 LAB — PROTEIN, URINE, RANDOM: Total Protein, Urine: 29 mg/dL

## 2010-10-30 LAB — GLUCOSE, CAPILLARY
Glucose-Capillary: 115 mg/dL — ABNORMAL HIGH (ref 70–99)
Glucose-Capillary: 137 mg/dL — ABNORMAL HIGH (ref 70–99)
Glucose-Capillary: 148 mg/dL — ABNORMAL HIGH (ref 70–99)
Glucose-Capillary: 177 mg/dL — ABNORMAL HIGH (ref 70–99)
Glucose-Capillary: 211 mg/dL — ABNORMAL HIGH (ref 70–99)

## 2010-10-30 LAB — BASIC METABOLIC PANEL
BUN: 33 mg/dL — ABNORMAL HIGH (ref 6–23)
Chloride: 104 mEq/L (ref 96–112)
Glucose, Bld: 248 mg/dL — ABNORMAL HIGH (ref 70–99)
Potassium: 4.6 mEq/L (ref 3.5–5.1)
Sodium: 134 mEq/L — ABNORMAL LOW (ref 135–145)

## 2010-10-30 LAB — CBC
Hemoglobin: 9.5 g/dL — ABNORMAL LOW (ref 13.0–17.0)
MCHC: 33.7 g/dL (ref 30.0–36.0)
Platelets: 147 10*3/uL — ABNORMAL LOW (ref 150–400)
RBC: 3.44 MIL/uL — ABNORMAL LOW (ref 4.22–5.81)
RDW: 15.3 % (ref 11.5–15.5)
WBC: 7.3 10*3/uL (ref 4.0–10.5)

## 2010-10-30 LAB — TYPE AND SCREEN: ABO/RH(D): O POS

## 2010-10-30 LAB — ABO/RH: ABO/RH(D): O POS

## 2010-11-03 LAB — GLUCOSE, CAPILLARY: Glucose-Capillary: 131 mg/dL — ABNORMAL HIGH (ref 70–99)

## 2010-11-03 LAB — HEMOGLOBIN A1C
Hgb A1c MFr Bld: 6.5 % — ABNORMAL HIGH (ref 4.6–6.1)
Mean Plasma Glucose: 140 mg/dL

## 2010-11-03 LAB — COMPREHENSIVE METABOLIC PANEL
ALT: 15 U/L (ref 0–53)
AST: 21 U/L (ref 0–37)
Alkaline Phosphatase: 51 U/L (ref 39–117)
CO2: 26 mEq/L (ref 19–32)
Chloride: 104 mEq/L (ref 96–112)
GFR calc Af Amer: 52 mL/min — ABNORMAL LOW (ref >60)
GFR calc non Af Amer: 43 mL/min — ABNORMAL LOW (ref >60)
Glucose, Bld: 108 mg/dL — ABNORMAL HIGH (ref 70–99)
Potassium: 4.4 mEq/L (ref 3.5–5.1)
Sodium: 137 mEq/L (ref 135–145)

## 2010-11-03 LAB — DIFFERENTIAL
Basophils Relative: 1 % (ref 0–1)
Eosinophils Absolute: 0.2 10*3/uL (ref 0.0–0.7)
Eosinophils Relative: 4 % (ref 0–5)
Lymphs Abs: 1.2 10*3/uL (ref 0.7–4.0)
Neutrophils Relative %: 59 % (ref 43–77)

## 2010-11-03 LAB — CBC
Hemoglobin: 11.3 g/dL — ABNORMAL LOW (ref 13.0–17.0)
RBC: 3.7 MIL/uL — ABNORMAL LOW (ref 4.22–5.81)
WBC: 5.1 10*3/uL (ref 4.0–10.5)

## 2010-11-03 LAB — APTT: aPTT: 53 seconds — ABNORMAL HIGH (ref 24–37)

## 2010-11-03 LAB — POCT CARDIAC MARKERS
CKMB, poc: <1 ng/mL — ABNORMAL LOW (ref 1.0–8.0)
Myoglobin, poc: 123 ng/mL (ref 12–200)

## 2010-11-03 LAB — TSH: TSH: 2.319 u[IU]/mL (ref 0.350–4.500)

## 2010-11-05 NOTE — Procedures (Signed)
NAME:  Bruce, Taylor NO.:  [id number]  MEDICAL RECORD NO.:  [id number]           PATIENT TYPE:  LOCATION:                                 FACILITY:  PHYSICIAN:  Nicki Guadalajara, M.D.     DATE OF BIRTH:  [birth year]  DATE OF PROCEDURE:  10/27/2010 DATE OF DISCHARGE:                           CARDIAC CATHETERIZATION   INDICATIONS:  [name] is a 70 year old patient of Dr. Julieanne Manson who has history of known coronary artery disease status post multiple interventions in the past.  He has been documented to have stents in his LAD, circumflex, and proximal mid RCA.  He underwent cardiac catheterization yesterday by Dr. Rennis Golden and was found to have in- stent restenosis in the proximal portion of his RCA stent.  There was also diffuse disease within the stented segment beyond this and 90% to 95% stenosis just beyond the distal aspect of the stent proximal to the acute margin in the right coronary artery.  The patient does have a history of atrial fibrillation on chronic Coumadin therapy.  He is status post pacemaker for remote history of complete heart block.  The patient has sleep apnea on CPAP, morbid obesity, COPD, type 2 diabetes mellitus, hypertension, as well as hyperlipidemia.  He now presents for intervention to the right coronary artery.  In light of the patient's Coumadin history, previous discussion had been undertaken with Dr. Herbie Baltimore and Dr. Clarene Duke concerning if stenting was necessary a non-DES stent to be inserted.  PROCEDURE:  After premedication with Versed initially 1 mg with 25 mcg of fentanyl, the patient was prepped and draped in usual fashion.  Right femoral artery was punctured anteriorly and a 6-French arterial sheath was inserted.  The patient had already been loaded with Effient.  A 6- [place] FR-4 guide was inserted and scout angiography confirmed diffuse proximal to mid in-stent restenosis with narrowing up to 95% in  the proximal bend of the RCA at the stented segment.  There appeared to be 80% narrowing in the distal aspect of the stent and then 95% stenosis just after the stented segment and proximal to the acute margin.  ACT was documented to be therapeutic after the administration of bivalirudin.  The patient also was on IV nitroglycerin.  He also received several doses of intracoronary nitroglycerin.  Previously, he received additional 1 and 25 of Versed and fentanyl respectively. Initially, a 3.0- x 15-mm cutting balloon was inserted since the stent in the RCA apparently was free of stents.  There was difficulty in passing this due to the significant stenoses, but ultimately little by little this cutting balloon arthrotomy catheter was able to be advanced and multiple cuts were made throughout the proximal, proximal to mid, and mid stented segment.  The balloon was unable to cross the more distal lesion.  After multiple cuts were made up to 10 atmospheres within the stented segment, the 3.0- x 15-mm cutting balloon was then removed and a 2.5- x 10-mm cutting balloon was inserted to attack the distal area.  This was able to be passed to the distal aspect of the stent and low-level inflation was made  just beyond the stented segment in the region of 95% stenosis.  Several cuts were made up to 4 and 5 atmospheres.  This cutting balloon was then advanced all the way back through the stented segment and dilated up to 10 atmospheres for additional scoring of the in-stent restenotic plaque.  A 3.0- x 12-mm bare-metal non-DES Medtronic Integrity stent was then inserted with stent overlap to the distal aspect of the previously placed stent to cover the area which had been 95% stenosed.  This was dilated x2 up to 13 atmospheres.  A 3.5- x 21-mm [place] Sprinter balloon was used for poststent dilatation.  Dilatation was made distally up to approximately 3.3 mm.  As the balloon was pulled back, there was  progressive increase dilatation such that the entire previously placed stent was dilated to 3.49 mm.  Scout angiography confirmed an excellent angiographic result. There was brisk TIMI 3 flow.  There was no evidence for dissection.  The patient left the catheterization laboratory in stable condition.  The arterial sheath was sutured in place with plans for sheath removal later today.  Bivalirudin was discontinued upon completion since the patient already had been preloaded with Effient prior to coming to the catheterization laboratory.  HEMODYNAMIC DATA:  Central aortic pressure was 121/66.  During the procedure, the patient received several doses of intracoronary nitroglycerin and his IV nitroglycerin was titrated up to 30 mcg.  ANGIOGRAPHIC DATA:  The right coronary artery was a large dominant vessel.  There was a long proximal to mid segment of this prior stenting with 3.0 stents in the proximal to mid RCA.  The proximal portion of the stent was narrowing up to 60%, and then after an anterior RV marginal branch there was 95% stenosis.  In the mid distal portion of the stent, the narrowing was 80% diffusely and then it was 95% just beyond the stented segment.  The RCA supplied a PDA and PLA system which were large caliber.  Following cutting balloon arthrotomy, with addition of a new bare-metal stent placed in tandem to the distal aspect of the previously placed stent to cover the 95% stenosis with a 3.0- x 12-mm non-DES bare-metal Integrity stent.  This was postdilated to 3.36 mm and the 95% stenosis was reduced to 0%.  The diffuse in-stent restenosis in the previously placed stent was then postdilated with 3.5 noncompliant sprinter balloon to 3.49 mm and the entire region was reduced to 0%.  There was brisk TIMI 3 flow.  There was no evidence for dissection.  IMPRESSION:  Successful cutting balloon arthrotomy, percutaneous transluminal coronary angioplasty, and insertion of one  additional new stent in the distal aspect of the previously placed right coronary artery stent (3.0- x 12-mm bare-metal Medtronic Integrity stent) postdilated to 3.36 mm with poststent and dilatation of the previously placed proximal to mid right coronary artery stent to 3.49 mm following cutting balloon arthrotomy with the narrowing reduced to 0%.  Next, bivalirudin/Effient/IV and IV nitroglycerin.          ______________________________ Nicki Guadalajara, M.D.     TK/MEDQ  D:  10/27/2010  T:  10/28/2010  Job:  [id number]  cc:   Thereasa Solo. Little, M.D.  Electronically Signed by Nicki Guadalajara M.D. on 11/05/2010 10:54:01 AM

## 2010-11-09 LAB — COMPREHENSIVE METABOLIC PANEL
ALT: 15 U/L (ref 0–53)
ALT: 15 U/L (ref 0–53)
AST: 22 U/L (ref 0–37)
Albumin: 3.3 g/dL — ABNORMAL LOW (ref 3.5–5.2)
Albumin: 3.7 g/dL (ref 3.5–5.2)
Alkaline Phosphatase: 50 U/L (ref 39–117)
Alkaline Phosphatase: 50 U/L (ref 39–117)
Calcium: 9.5 mg/dL (ref 8.4–10.5)
GFR calc Af Amer: >60 mL/min (ref >60)
Glucose, Bld: 108 mg/dL — ABNORMAL HIGH (ref 70–99)
Potassium: 4.3 mEq/L (ref 3.5–5.1)
Potassium: 4.6 mEq/L (ref 3.5–5.1)
Sodium: 135 mEq/L (ref 135–145)
Sodium: 136 mEq/L (ref 135–145)
Total Protein: 6.9 g/dL (ref 6.0–8.3)
Total Protein: 7.4 g/dL (ref 6.0–8.3)

## 2010-11-09 LAB — CBC
HCT: 27.2 % — ABNORMAL LOW (ref 39.0–52.0)
HCT: 28.7 % — ABNORMAL LOW (ref 39.0–52.0)
HCT: 29.8 % — ABNORMAL LOW (ref 39.0–52.0)
HCT: 33.6 % — ABNORMAL LOW (ref 39.0–52.0)
Hemoglobin: 11.2 g/dL — ABNORMAL LOW (ref 13.0–17.0)
Hemoglobin: 9.8 g/dL — ABNORMAL LOW (ref 13.0–17.0)
MCHC: 32.9 g/dL (ref 30.0–36.0)
MCHC: 33.3 g/dL (ref 30.0–36.0)
MCHC: 33.9 g/dL (ref 30.0–36.0)
MCV: 88.7 fL (ref 78.0–100.0)
MCV: 89.4 fL (ref 78.0–100.0)
MCV: 90.6 fL (ref 78.0–100.0)
MCV: 90.7 fL (ref 78.0–100.0)
Platelets: 111 10*3/uL — ABNORMAL LOW (ref 150–400)
Platelets: 114 10*3/uL — ABNORMAL LOW (ref 150–400)
Platelets: 145 10*3/uL — ABNORMAL LOW (ref 150–400)
Platelets: 176 10*3/uL (ref 150–400)
Platelets: 202 10*3/uL (ref 150–400)
RBC: 3.07 MIL/uL — ABNORMAL LOW (ref 4.22–5.81)
RBC: 3.18 MIL/uL — ABNORMAL LOW (ref 4.22–5.81)
RDW: 15.1 % (ref 11.5–15.5)
RDW: 15.3 % (ref 11.5–15.5)
RDW: 15.7 % — ABNORMAL HIGH (ref 11.5–15.5)
WBC: 10.1 10*3/uL (ref 4.0–10.5)
WBC: 3 10*3/uL — ABNORMAL LOW (ref 4.0–10.5)
WBC: 7.8 10*3/uL (ref 4.0–10.5)

## 2010-11-09 LAB — APTT: aPTT: 39 seconds — ABNORMAL HIGH (ref 24–37)

## 2010-11-09 LAB — CK TOTAL AND CKMB (NOT AT ARMC): Relative Index: INVALID (ref 0.0–2.5)

## 2010-11-09 LAB — TSH: TSH: 2.922 u[IU]/mL (ref 0.350–4.500)

## 2010-11-09 LAB — GLUCOSE, CAPILLARY
Glucose-Capillary: 104 mg/dL — ABNORMAL HIGH (ref 70–99)
Glucose-Capillary: 112 mg/dL — ABNORMAL HIGH (ref 70–99)
Glucose-Capillary: 116 mg/dL — ABNORMAL HIGH (ref 70–99)
Glucose-Capillary: 117 mg/dL — ABNORMAL HIGH (ref 70–99)
Glucose-Capillary: 125 mg/dL — ABNORMAL HIGH (ref 70–99)
Glucose-Capillary: 127 mg/dL — ABNORMAL HIGH (ref 70–99)
Glucose-Capillary: 170 mg/dL — ABNORMAL HIGH (ref 70–99)
Glucose-Capillary: 202 mg/dL — ABNORMAL HIGH (ref 70–99)
Glucose-Capillary: 85 mg/dL (ref 70–99)

## 2010-11-09 LAB — BASIC METABOLIC PANEL
BUN: 24 mg/dL — ABNORMAL HIGH (ref 6–23)
BUN: 29 mg/dL — ABNORMAL HIGH (ref 6–23)
BUN: 31 mg/dL — ABNORMAL HIGH (ref 6–23)
BUN: 36 mg/dL — ABNORMAL HIGH (ref 6–23)
CO2: 24 mEq/L (ref 19–32)
CO2: 25 mEq/L (ref 19–32)
CO2: 27 mEq/L (ref 19–32)
Calcium: 8.5 mg/dL (ref 8.4–10.5)
Chloride: 101 mEq/L (ref 96–112)
Chloride: 103 mEq/L (ref 96–112)
Creatinine, Ser: 1.38 mg/dL (ref 0.4–1.5)
Creatinine, Ser: 1.43 mg/dL (ref 0.4–1.5)
Creatinine, Ser: 1.67 mg/dL — ABNORMAL HIGH (ref 0.4–1.5)
GFR calc Af Amer: 50 mL/min — ABNORMAL LOW (ref >60)
GFR calc Af Amer: 59 mL/min — ABNORMAL LOW (ref >60)
GFR calc non Af Amer: 29 mL/min — ABNORMAL LOW (ref >60)
Glucose, Bld: 65 mg/dL — ABNORMAL LOW (ref 70–99)
Glucose, Bld: 77 mg/dL (ref 70–99)
Potassium: 4.1 mEq/L (ref 3.5–5.1)
Potassium: 4.1 mEq/L (ref 3.5–5.1)
Potassium: 5 mEq/L (ref 3.5–5.1)
Sodium: 136 mEq/L (ref 135–145)

## 2010-11-09 LAB — URINALYSIS, ROUTINE W REFLEX MICROSCOPIC
Glucose, UA: NEGATIVE mg/dL
Ketones, ur: NEGATIVE mg/dL
Nitrite: NEGATIVE
Protein, ur: NEGATIVE mg/dL
Urobilinogen, UA: 0.2 mg/dL (ref 0.0–1.0)

## 2010-11-09 LAB — PLATELET COUNT: Platelets: 176 10*3/uL (ref 150–400)

## 2010-11-09 LAB — BRAIN NATRIURETIC PEPTIDE
Pro B Natriuretic peptide (BNP): 541 pg/mL — ABNORMAL HIGH (ref 0.0–100.0)
Pro B Natriuretic peptide (BNP): 671 pg/mL — ABNORMAL HIGH (ref 0.0–100.0)

## 2010-11-09 LAB — PROTIME-INR
INR: 1.2 (ref 0.00–1.49)
INR: 1.2 (ref 0.00–1.49)
Prothrombin Time: 13.9 seconds (ref 11.6–15.2)

## 2010-11-09 LAB — CARDIAC PANEL(CRET KIN+CKTOT+MB+TROPI)
CK, MB: 2.5 ng/mL (ref 0.3–4.0)
CK, MB: 2.8 ng/mL (ref 0.3–4.0)
Total CK: 76 U/L (ref 7–232)

## 2010-11-09 LAB — TROPONIN I: Troponin I: 0.01 ng/mL (ref 0.00–0.06)

## 2010-11-10 LAB — GLUCOSE, CAPILLARY
Glucose-Capillary: 112 mg/dL — ABNORMAL HIGH (ref 70–99)
Glucose-Capillary: 88 mg/dL (ref 70–99)

## 2010-11-10 LAB — PROTIME-INR
INR: 1 (ref 0.00–1.49)
Prothrombin Time: 13.9 seconds (ref 11.6–15.2)

## 2010-11-11 NOTE — Discharge Summary (Signed)
NAME:  Bruce Taylor, Bruce Taylor NO.:  [id number]  MEDICAL RECORD NO.:  [id number]           PATIENT TYPE:  I  LOCATION:  6525                         FACILITY:  [facility]  PHYSICIAN:  Thereasa Solo. Jamichael Knotts, M.D. DATE OF BIRTH:  04/11/1941  DATE OF ADMISSION:  10/24/2010 DATE OF DISCHARGE:  10/28/2010                              DISCHARGE SUMMARY   DISCHARGE DIAGNOSES: 1. Unstable angina, negative myocardial infarction. 2. Coronary disease with new 95% in-stent restenosis to previous stent     to the right coronary artery and additional distal stenosis of the     distal end of the same stent, undergoing cutting balloon     angioplasty and percutaneous transluminal coronary angioplasty and     stent to the distal aspect of the stent to the right coronary     artery with a non drug-eluting Integrity stent.     a.     Nonobstructive disease in the previous left anterior      descending stent, diagonal stent, and circumflex stents. 3. Chronic atrial fibrillation, resuming Coumadin, no heparin-Coumadin     crossover. 4. Permanent transvenous pacemaker for sick sinus syndrome. 5. Obstructive sleep apnea, on continuous positive airway pressure as     well as continuous home oxygen use. 6. Diabetes mellitus, type 2, insulin dependent. 7. Renal insufficiency, stage III. 8. Morbid obesity. 9. Chronic obstructive pulmonary disease, on chronic home oxygen. 10.Treated dyslipidemia. 11.History of tremor, followed by Dr. Anne Hahn.  DISCHARGE CONDITION:  Improved.  PROCEDURES: 1. Combined left heart cath, October 26, 2010, by Dr. Bryan Lemma. 2. October 27, 2010, cutting balloon atherectomy for in-stent restenosis     of the right coronary artery stent and new percutaneous     transluminal coronary angioplasty and stent to the distal portion     of the same stent and beyond.  DISCHARGE INSTRUCTIONS: 1. Continue home oxygen as before. 2. Continue CPAP as before. 3. Increase activity slowly.   May shower.  No lifting for 1 week.  No     driving for 2 days. 4. Low-sodium heart-healthy diabetic diet. 5. Wash cath site with soap and water.  Call if any bleeding,     swelling, or drainage. 6. Follow up with Dr. Clarene Duke, November 11, 2010, at 3:00 p.m. 7. Follow up with the [facility]     on Monday, November 02, 2010, as needed. 8. We will also have the patient have a basic metabolic panel done on     November 05, 2010, to ensure renal function is stable.  DISCHARGE MEDICATIONS:  See medication reconciliation sheet.  Please note, we are resuming Coumadin without heparin-Coumadin crossover at the same dose and he will have his INR evaluated on Monday.  HOSPITAL COURSE:  Bruce Taylor presented to the emergency room on October 24, 2010, with known coronary disease, please see H and P.  He had been doing quite well but 3-4 days prior to admission, he had been having chest discomfort, relieved with nitroglycerin.  After eating breakfast, he had severe chest pain described as heaviness, pain rated 6-7/10 on a 1-10 scale and was given  IV nitro and was feeling somewhat better after the nitro.  He was admitted to rule out MI.  Fortunately, his cardiac enzymes were negative.  He stabilized, did have some renal insufficiency as well and his lisinopril was discontinued as well as his Aldactone and his Lasix and his renal insufficiency improved with hydration.  He underwent cardiac catheterization on October 26, 2010, with results as previously stated.  They were unable to proceed with the stent on October 26, 2010, due to power failure and he underwent his stent and atherectomy on October 27, 2010.  By October 28, 2010, he was stable and ready for discharge home.  We felt it would be higher risk to place the patient on aspirin, Effient, Coumadin, and heparin, so we felt just loading him back on his Coumadin at regular dosing with the Effient and the aspirin at 81 mg.  We  decreased his lisinopril to 20.  Hold his hydrochlorothiazide for two days prior to restarting that as well.  He will follow up with Dr. Clarene Duke and will continue to hold Aldactone, Dr. Clarene Duke can resume that if needed as an outpatient.  LABORATORY DATA AT DISCHARGE:  Sodium 134, potassium 4.8, BUN 20, creatinine 1.51, glucose was elevated at 200.  Hemoglobin 12.1, hematocrit 37.2, platelets 180, WBC 7.4.  Protime is 14.5, INR 1.11.  Blood pressure was 114/54, pulse 57, respirations 24, and temperature 97.9, oxygen saturation on 2 liters was 93%.  Heart irregular.  Lungs were clear.  Groin stable.  The patient will follow up with Dr. Clarene Duke in the [facility] as well as cardiac rehab would be beneficial for the patient.     Darcella Gasman. Ingold, N.P.   ______________________________ Thereasa Solo Eulas Schweitzer, M.D.    LRI/MEDQ  D:  10/28/2010  T:  10/28/2010  Job:  [id number]  cc:   Alfonse Alpers. Dagoberto Ligas, M.D.  Electronically Signed by Nada Boozer N.P. on 10/30/2010 08:04:35 AM Electronically Signed by Julieanne Manson M.D. on 11/11/2010 08:24:17 AM

## 2010-11-11 NOTE — H&P (Signed)
NAME:  Bruce Taylor, Bruce Taylor NO.:  [id number]  MEDICAL RECORD NO.:  [id number]           PATIENT TYPE:  E  LOCATION:  [facility]                         FACILITY:  [facility]  PHYSICIAN:  A. Little, M.D.  DATE OF BIRTH:  January 02, 1941  DATE OF ADMISSION:  10/24/2010 DATE OF DISCHARGE:                             HISTORY & PHYSICAL   CHIEF COMPLAINT:  Chest pain.  HISTORY OF PRESENT ILLNESS:  Bruce Taylor is a 70 year old male who is followed by Dr. Clarene Duke and Dr. Corrin Parker.  He has a history of coronary disease.  He has had multiple stents in all vessels in the past.  The last PCI was in January 2010 when he had in-stent restenosis to an OM that was treated with high-speed rotational atherectomy.  He was cathed a month after this in February 2010 and all sites were patent.  He has actually done pretty well from a cardiac standpoint since then.  He saw Dr. Clarene Duke in December 2011 and was doing pretty well.  The patient says for the last 3 or 4 days he has had some chest discomfort and this has been relieved with nitroglycerin.  This morning after eating breakfast, he had more severe chest pain described as heaviness, not relieved with nitroglycerin x2.  He was instructed to call EMS and is now seen in the emergency room.  He continues to have chest pain, which he describes as 6 out of 7, coming and going in the emergency room on nitroglycerin, although overall he is better.  He is admitted now for further evaluation.  PAST MEDICAL HISTORY:  Remarkable for coronary disease as described above with multiple stents, last intervention in January 2010 to the OM for in-stent restenosis and last catheterization in February 2010 showing patent stent sites.  Echocardiogram in October 2010 showed his EF to be 45-50%.  He has had a history of complete heart block and had a Medtronic pacemaker implanted in January 2010.  He now has chronic atrial fibrillation and is on chronic Coumadin  therapy.  He is morbidly obese and has sleep apnea and is on CPAP.  He has COPD, he quit smoking after 50 years in 2009, he is on chronic home O2.  He has type 2 insulin- dependent diabetes.  He has treated hypertension.  He has treated dyslipidemia.  He has a benign essential tremor and has been followed by Dr. Anne Hahn.  He has history of BPH.  He had some bladder bleeding in June 03, 2009, and had a cystoscopy and cauterization of his bladder, which relieved problems.  He has had remote appendectomy and tonsillectomy.  He has had problems with low back pain in the past.  HOME MEDICATIONS: 1. Lasix 40 mg a day. 2. Celexa 40 mg a day. 3. Imdur 30 mg a day. 4. Coumadin 10 mg a day except 5 mg on Monday and Friday. 5. Simvastatin 40 mg a day. 6. Lisinopril 20 mg a day. 7. Aldactone 25 mg a day. 8. Insulin 70/30; 65 units at bedtime. 9. Coreg 6.125 mg b.i.d. 10.Aspirin 81 mg a day. 11.Oxybutynin.  He had been prescribed primidone for tremor,  but the patient's wife said that she did not get this refilled, she did not think it was helping. He uses CPAP at night and oxygen 2 L during the day and at night.  ALLERGIES:  He is allergic to PENICILLIN.  SOCIAL HISTORY:  The patient is married.  He quit smoking approximately a year ago.  FAMILY HISTORY:  Unremarkable.  REVIEW OF SYSTEMS:  Essentially unremarkable except as noted above.  PHYSICAL EXAMINATION:  VITAL SIGNS:  Blood pressure 150/78, pulse 70, temperature 98.2, O2 sat is 97 on 2 L. GENERAL:  He is a morbidly obese male in no acute distress. HEENT:  Normocephalic.  He has poor dentition. NECK:  Without JVD or bruit. CHEST:  Reveals diminished breath sounds. CARDIAC:  Reveals regular rate and rhythm without obvious murmur, rub, or gallops. ABDOMEN:  Morbidly obese, nontender. EXTREMITIES:  No edema.  Distal pulses are 2+/4 bilaterally. NEUROLOGIC:  Grossly intact.  He is awake, alert, and oriented, cooperative.  Moves  all extremities without obvious deficit. SKIN:  Cool and dry.  LABORATORY DATA:  Sodium 133, potassium 4.7, BUN 37, creatinine 1.88, his creatinine in December 2010 with 1.19, troponin is negative.  D- dimer is 0.22.  White count is 5.8, hemoglobin 12.3, hematocrit 37.2, and platelets 183.  EKG shows paced rhythm.  IMPRESSION: 1. Unstable angina. 2. Known coronary disease with multiple stents in all vessels, last     intervention in January 2010, cath 1 month later in February 2010     showing patent stent sites. 3. Mild left ventricular dysfunction with an EF of 45-50% by     echocardiogram in October 2010. 4. History of complete heart block with Medtronic pacemaker implanted     in January 2010, now with chronic atrial fibrillation on chronic     Coumadin. 5. Morbid obesity. 6. Sleep apnea, on CPAP. 7. Chronic obstructive pulmonary disease, on chronic O2. 8. Type 2 insulin-dependent diabetes. 9. Treated hypertension. 10.Treated dyslipidemia. 11.History of tremor followed by Dr. Anne Hahn.  PLAN:  The patient will be admitted to step-down.  We will titrate his nitroglycerin for pain.  Continue ruling him out for an MI.  INR was not ordered on admission to the ER and this will be checked.  For now, we will hold his Coumadin as he may need to be restudied.  He does have new renal insufficiency and this will need to be followed, we will hold his diuretic and ACE inhibitor for now.     Bruce Taylor, P.A.   ______________________________ A. Little, M.D.    Lenard Lance  D:  10/24/2010  T:  10/24/2010  Job:  [id number]  cc:   Caryn Bee L. Little, M.D.  Electronically Signed by Corine Shelter P.A. on 10/26/2010 11:41:51 AM Electronically Signed by Julieanne Manson M.D. on 11/11/2010 08:24:21 AM

## 2010-11-16 ENCOUNTER — Other Ambulatory Visit: Payer: Self-pay | Admitting: Cardiology

## 2010-11-16 ENCOUNTER — Encounter (HOSPITAL_COMMUNITY): Payer: Medicare Other | Attending: Cardiology

## 2010-11-16 DIAGNOSIS — I129 Hypertensive chronic kidney disease with stage 1 through stage 4 chronic kidney disease, or unspecified chronic kidney disease: Secondary | ICD-10-CM | POA: Insufficient documentation

## 2010-11-16 DIAGNOSIS — J4489 Other specified chronic obstructive pulmonary disease: Secondary | ICD-10-CM | POA: Insufficient documentation

## 2010-11-16 DIAGNOSIS — N183 Chronic kidney disease, stage 3 unspecified: Secondary | ICD-10-CM | POA: Insufficient documentation

## 2010-11-16 DIAGNOSIS — J449 Chronic obstructive pulmonary disease, unspecified: Secondary | ICD-10-CM | POA: Insufficient documentation

## 2010-11-16 DIAGNOSIS — E119 Type 2 diabetes mellitus without complications: Secondary | ICD-10-CM | POA: Insufficient documentation

## 2010-11-16 DIAGNOSIS — I4891 Unspecified atrial fibrillation: Secondary | ICD-10-CM | POA: Insufficient documentation

## 2010-11-16 DIAGNOSIS — G4733 Obstructive sleep apnea (adult) (pediatric): Secondary | ICD-10-CM | POA: Insufficient documentation

## 2010-11-16 DIAGNOSIS — Z9981 Dependence on supplemental oxygen: Secondary | ICD-10-CM | POA: Insufficient documentation

## 2010-11-16 DIAGNOSIS — Z950 Presence of cardiac pacemaker: Secondary | ICD-10-CM | POA: Insufficient documentation

## 2010-11-16 DIAGNOSIS — I200 Unstable angina: Secondary | ICD-10-CM | POA: Insufficient documentation

## 2010-11-16 DIAGNOSIS — E785 Hyperlipidemia, unspecified: Secondary | ICD-10-CM | POA: Insufficient documentation

## 2010-11-16 DIAGNOSIS — Z5189 Encounter for other specified aftercare: Secondary | ICD-10-CM | POA: Insufficient documentation

## 2010-11-16 DIAGNOSIS — Z9861 Coronary angioplasty status: Secondary | ICD-10-CM | POA: Insufficient documentation

## 2010-11-16 DIAGNOSIS — Z880 Allergy status to penicillin: Secondary | ICD-10-CM | POA: Insufficient documentation

## 2010-11-16 DIAGNOSIS — I2510 Atherosclerotic heart disease of native coronary artery without angina pectoris: Secondary | ICD-10-CM | POA: Insufficient documentation

## 2010-11-16 LAB — GLUCOSE, CAPILLARY: Glucose-Capillary: 134 mg/dL — ABNORMAL HIGH (ref 70–99)

## 2010-11-18 ENCOUNTER — Encounter (HOSPITAL_COMMUNITY): Payer: Medicare Other

## 2010-11-18 ENCOUNTER — Other Ambulatory Visit: Payer: Self-pay | Admitting: Cardiology

## 2010-11-19 ENCOUNTER — Emergency Department (HOSPITAL_COMMUNITY)
Admission: EM | Admit: 2010-11-19 | Discharge: 2010-11-19 | Disposition: A | Discharge status: Home / Self Care | Payer: Medicare Other | Attending: Emergency Medicine | Admitting: Emergency Medicine

## 2010-11-19 DIAGNOSIS — W268XXA Contact with other sharp object(s), not elsewhere classified, initial encounter: Secondary | ICD-10-CM | POA: Insufficient documentation

## 2010-11-19 DIAGNOSIS — E119 Type 2 diabetes mellitus without complications: Secondary | ICD-10-CM | POA: Insufficient documentation

## 2010-11-19 DIAGNOSIS — L03319 Cellulitis of trunk, unspecified: Secondary | ICD-10-CM | POA: Insufficient documentation

## 2010-11-19 DIAGNOSIS — I10 Essential (primary) hypertension: Secondary | ICD-10-CM | POA: Insufficient documentation

## 2010-11-19 DIAGNOSIS — S01502A Unspecified open wound of oral cavity, initial encounter: Secondary | ICD-10-CM | POA: Insufficient documentation

## 2010-11-19 DIAGNOSIS — T45515A Adverse effect of anticoagulants, initial encounter: Secondary | ICD-10-CM | POA: Insufficient documentation

## 2010-11-19 DIAGNOSIS — E7800 Pure hypercholesterolemia, unspecified: Secondary | ICD-10-CM | POA: Insufficient documentation

## 2010-11-19 DIAGNOSIS — I2510 Atherosclerotic heart disease of native coronary artery without angina pectoris: Secondary | ICD-10-CM | POA: Insufficient documentation

## 2010-11-19 DIAGNOSIS — I252 Old myocardial infarction: Secondary | ICD-10-CM | POA: Insufficient documentation

## 2010-11-19 DIAGNOSIS — J45909 Unspecified asthma, uncomplicated: Secondary | ICD-10-CM | POA: Insufficient documentation

## 2010-11-19 DIAGNOSIS — Z794 Long term (current) use of insulin: Secondary | ICD-10-CM | POA: Insufficient documentation

## 2010-11-19 DIAGNOSIS — R791 Abnormal coagulation profile: Secondary | ICD-10-CM | POA: Insufficient documentation

## 2010-11-19 DIAGNOSIS — L02219 Cutaneous abscess of trunk, unspecified: Secondary | ICD-10-CM | POA: Insufficient documentation

## 2010-11-19 LAB — PROTIME-INR
INR: 2.58 — ABNORMAL HIGH (ref 0.00–1.49)
Prothrombin Time: 27.8 seconds — ABNORMAL HIGH (ref 11.6–15.2)

## 2010-11-19 LAB — GLUCOSE, CAPILLARY: Glucose-Capillary: 133 mg/dL — ABNORMAL HIGH (ref 70–99)

## 2010-11-20 ENCOUNTER — Encounter (HOSPITAL_COMMUNITY): Payer: Medicare Other

## 2010-11-23 ENCOUNTER — Encounter (HOSPITAL_COMMUNITY): Payer: Medicare Other

## 2010-11-23 LAB — GLUCOSE, CAPILLARY: Glucose-Capillary: 188 mg/dL — ABNORMAL HIGH (ref 70–99)

## 2010-11-25 ENCOUNTER — Other Ambulatory Visit: Payer: Self-pay | Admitting: Cardiology

## 2010-11-25 ENCOUNTER — Encounter (HOSPITAL_COMMUNITY): Payer: Medicare Other | Attending: Cardiology

## 2010-11-25 DIAGNOSIS — G4733 Obstructive sleep apnea (adult) (pediatric): Secondary | ICD-10-CM | POA: Insufficient documentation

## 2010-11-25 DIAGNOSIS — E785 Hyperlipidemia, unspecified: Secondary | ICD-10-CM | POA: Insufficient documentation

## 2010-11-25 DIAGNOSIS — E119 Type 2 diabetes mellitus without complications: Secondary | ICD-10-CM | POA: Insufficient documentation

## 2010-11-25 DIAGNOSIS — Z9861 Coronary angioplasty status: Secondary | ICD-10-CM | POA: Insufficient documentation

## 2010-11-25 DIAGNOSIS — N183 Chronic kidney disease, stage 3 unspecified: Secondary | ICD-10-CM | POA: Insufficient documentation

## 2010-11-25 DIAGNOSIS — I200 Unstable angina: Secondary | ICD-10-CM | POA: Insufficient documentation

## 2010-11-25 DIAGNOSIS — J449 Chronic obstructive pulmonary disease, unspecified: Secondary | ICD-10-CM | POA: Insufficient documentation

## 2010-11-25 DIAGNOSIS — I4891 Unspecified atrial fibrillation: Secondary | ICD-10-CM | POA: Insufficient documentation

## 2010-11-25 DIAGNOSIS — Z950 Presence of cardiac pacemaker: Secondary | ICD-10-CM | POA: Insufficient documentation

## 2010-11-25 DIAGNOSIS — Z9981 Dependence on supplemental oxygen: Secondary | ICD-10-CM | POA: Insufficient documentation

## 2010-11-25 DIAGNOSIS — Z880 Allergy status to penicillin: Secondary | ICD-10-CM | POA: Insufficient documentation

## 2010-11-25 DIAGNOSIS — Z5189 Encounter for other specified aftercare: Secondary | ICD-10-CM | POA: Insufficient documentation

## 2010-11-25 DIAGNOSIS — J4489 Other specified chronic obstructive pulmonary disease: Secondary | ICD-10-CM | POA: Insufficient documentation

## 2010-11-25 DIAGNOSIS — I2510 Atherosclerotic heart disease of native coronary artery without angina pectoris: Secondary | ICD-10-CM | POA: Insufficient documentation

## 2010-11-25 DIAGNOSIS — I129 Hypertensive chronic kidney disease with stage 1 through stage 4 chronic kidney disease, or unspecified chronic kidney disease: Secondary | ICD-10-CM | POA: Insufficient documentation

## 2010-11-27 ENCOUNTER — Encounter (HOSPITAL_COMMUNITY): Payer: Medicare Other

## 2010-11-30 ENCOUNTER — Encounter (HOSPITAL_COMMUNITY): Payer: Medicare Other

## 2010-12-02 ENCOUNTER — Encounter (HOSPITAL_COMMUNITY): Payer: Medicare Other

## 2010-12-04 ENCOUNTER — Encounter (HOSPITAL_COMMUNITY): Payer: Medicare Other

## 2010-12-07 ENCOUNTER — Encounter (HOSPITAL_COMMUNITY): Payer: Medicare Other

## 2010-12-08 NOTE — H&P (Signed)
NAME:  [name], [name] NO.:  [id number]   MEDICAL RECORD NO.:  [id number]          PATIENT TYPE:  EMS   LOCATION:  [facility]                         FACILITY:  [facility]   PHYSICIAN:  Thereasa Solo. Little, M.D. DATE OF BIRTH:  [birth year]   DATE OF ADMISSION:  11/29/2008  DATE OF DISCHARGE:                              HISTORY & PHYSICAL   Please note this is no longer is an H and P for admission but an H and P  for emergency visit.   Bruce Taylor presented to the emergency room as previously stated for  chest pain during cardiac rehab.  He has been totally pain free here in  the emergency room and no further complaints.  A portable chest x-ray  revealed cardiomegaly and central pulmonary vascular prominence.  A  pacemaker was in place and mildly torturous aorta.   LAB VALUES:  INR is 2.8, myoglobin 123, CK-MB less than 1.0 and troponin  I less than 0.05.  Sodium 137, potassium 4.4, BUN 31, creatinine 1.6 and  glucose 108.  LFTs were entirely normal and BNP was 170.   Dr. Jacinto Halim saw the patient and examined him and felt he was stable and  could be discharged.  He does have an appointment with Dr. Clarene Duke Dec 04, 2008 at 9:30 a.m. We will continue him on current medications  without changes.  If the second cardiac markers are negative, he will go  home today; on call over the weekend if he has further problems.  Continue current medicines.   Additionally, the patient has a skin abscess with sinus drainage, right  breast area of his chest wall and feels he needs some packing.  We have  asked the patient to go to his primary care for management of this  issue.  We will see him in the office on the 12th unless he has problems  prior to that time.  He will have his pacemaker interrogated at that  time as well.      Darcella Gasman. Ingold, N.P.    ______________________________  Thereasa Solo Little, M.D.    LRI/MEDQ  D:  11/29/2008  T:  11/29/2008  Job:  [id number]   cc:   Thereasa Solo. Little, M.D.

## 2010-12-08 NOTE — Op Note (Signed)
NAME:  ODA, LANSDOWNE NO.:  [id number]   MEDICAL RECORD NO.:  [id number]          PATIENT TYPE:  INP   LOCATION:  2902                         FACILITY:  [facility]   PHYSICIAN:  Ritta Slot, MD     DATE OF BIRTH:  08-26-1940   DATE OF PROCEDURE:  DATE OF DISCHARGE:                               OPERATIVE REPORT   PERMANENT PACEMAKER INSERTION   INDICATION:  [name] is a very pleasant 70 year old  gentleman with morbid obesity, obstructive sleep apnea, pulmonary  hypertension, coronary artery disease, and complete heart block.  He was  admitted to [facility], July 31, 2007, with complete heart  block with an acute coronary syndrome requiring stenting of his OM.  Prior to the procedure, he was in complete heart block requiring  temporary pacemaker.  Earlier this morning, he continued to have  complete heart block with an underlying rhythm.  It was decided to go  ahead and proceed with permanent pacemaker insertion.   DETAILS OF PROCEDURE:  After informed consent, the patient was brought  to the second floor of the [facility] where his left shoulder  was prepped and draped in usual sterile fashion.  He was initially given  2 mg of Versed and 25 mcg of fentanyl for light sedation.  After  venography was performed, local anesthetic was infiltrated around the  area of the subclavian vein and a 3-cm long incision was made using  scalpel with blunt dissection down to the pectoralis fascia.  Hemostasis  was obtained with electrocautery.  A deep pocket was made using blunt  dissection and access into subclavian vein was initially made with the  micropuncture kit.  Once the micropuncture kit guidewire was in place,  this was exchanged for a regular dilator and a 7-French sheath was then  introduced into the subclavian vein with significant difficulty and  resistance.  Through the 7-French sheath was placed a regular guidewire.  Venous  access was extremely difficult overall.  Multiple attempts were  made at passing the ventricular lead through the guidewire into the  subclavian vein.  This was unsuccessful.   It was then decided to exchange a 7-French sheath for a 9-French sheath.  A guidewire was placed back down into 7-French sheath; 7-French sheath  was then removed and 9-French sheath was then attempted to be placed in  the subclavian vein.  Once again, this was fraught with extreme  difficulty and resistance, finding access into subclavian vein beyond  the junction of the first rib and the clavicle.  Multiple attempts were  made passing a 9-French sheath into the subclavian vein, 15-cm in  length, but this was found to be unsuccessful.  It was then decided to  exchange the guidewire for an Amplatz long stiff wire.  This was  performed with success.  Over the Amplatz wire was then passed a 9-  [place] sheath, 25 cm.  Initially, significant resistance was found but  eventually, this passed smoothly and the sheath was placed into the  right atrium.  Through the 9-French sheath was passed a ventricular  lead, subsequently  it was placed into the right ventricle without  difficulty.  Excellent measurements were obtained.  R-waves were  obtained at 22.9 mV and impedance 744, capture was obtained at 2 volts,  0.5 milliseconds.  The V-lead was then screwed under fluoroscopy into  the LV apex without difficulty.  A 2-D angiogram was obtained.  R-waves  were measured at 22.9, impedance was 744, and threshold 0.4 and 0.5 with  current of 0.5 and 10 volts with negative diaphragmatic stimulation.  Attention was then made to the A leads.  With the guidewire in the  subclavian vein, attempts were then made to pass several 9-French  sheaths, which were predilated in subclavian vein.  The patient was  found to be agitated, aggressive, and made the situation extremely  difficult to deal with.  So it was then decided to sedate and  intubate  the patient under general anesthesia.  General anesthesia was called and  the patient was intubated successfully without difficulty.  A 7-French  sheath was passed by Dr. Michelle Taylor.  The patient was then sedated and the  procedure continued.   A regular guidewire was then exchanged over a 7-French sheath with  another Amplatz long stiff wire with several attempts with multiple free  dilations, 9-French and 7-French sheath but unfortunately, this was  unsuccessful.  A total of 6 times 7-French sheaths were used and in  addition, a total of 5 times 9-French sheaths were used, all of which  were unsuccessful.  It was therefore decided that the attempts at  placing a lead to be abandoned as this would cause more harm than good  to the patient.  It was then decided to secure the V lead with a single-  chamber pacemaker.  The generator was an Adapta serial number [id number]  [id number].   During the time of intubation, his blood pressure did not drop to around  80/60 and it was decided to start him on temporally on dopamine 10 mcg  per minute.  He responded nicely to the dopamine and his blood pressure  rose to 160/80 and the dopamine was then discontinued.  Once the  pacemaker was elevated to the pocket, it was sutured down to the pocket  with 0-0 silk. The V lead was also sutured down with x2 0-0 sutures.  The pocket was then irrigated copiously with 1% clindamycin solution.  The copious pocket was then closed with 2-0 Vicryl to the deep tissue  and 4-0 Vicryl to the superficial skin.  The pocket size seemed to be  oozing and a pressure dressing was applied.  The patient remained  intubated during the remainder of the period and he was returned to the  CCU intubated.  The Vasodip will be then discontinued, and the patient  will be extubated straight away.  He was given 600 mg of Plavix on  arrival to CCU.   COMPLICATIONS:  None.   FINAL CONCLUSION:  Successful implantation of a  single-chamber  pacemaker, failed attempt at passing the atrial lead beyond the junction  of the subclavian vein at the bifurcation of the clavicle and first rib.      Ritta Slot, MD  Electronically Signed     HS/MEDQ  D:  07/31/2008  T:  08/01/2008  Job:  [id number]

## 2010-12-08 NOTE — H&P (Signed)
NAME:  Bruce Taylor, Bruce Taylor NO.:  [id number]   MEDICAL RECORD NO.:  [id number]          PATIENT TYPE:  EMS   LOCATION:  MAJO                         FACILITY:  [facility]   PHYSICIAN:  Thereasa Solo. Little, M.D. DATE OF BIRTH:  February 26, 1941   DATE OF ADMISSION:  11/29/2008  DATE OF DISCHARGE:                              HISTORY & PHYSICAL   CHIEF COMPLAINT:  Chest tightness.   HISTORY OF PRESENT ILLNESS:  A 70 year old white married male,  cardiology patient of Dr. Clarene Duke with a history of coronary disease,  including stents to mid left circumflex and OM2 as well as the RCA and  recent cutting balloon angioplasties in February 2010 secondary to in-  stent restenosis in the second OM in the left circumflex vessel.  The  patient since that time has been involved with cardiac rehab and today  during his weights, in between lifting weights he developed left sternal  chest tightness rated 7/10.  He had some shortness of breath but no  diaphoresis or nausea.  He told the cardiac rehab, ended up giving him a  total of 3 nitroglycerin sublingual on oxygen before improvement and now  and the emergency room he has no further chest discomfort.  He states he  had a few little episodes of tightness, but it lasted very briefly.  This has been worse since his recent cutting balloon angioplasties.   In January he had also had a permanent transvenous pacemaker placed.  Please note, the patient had a re-cath in February 2010.  Cath was done  that time for nitroglycerin-responsive chest pain.  It was an elective  outpatient cardiac cath and his Coumadin had been placed on hold.  That  cath revealed patent stents.  Dr. Clarene Duke could not explain his chest  pain, felt that it could be small vessel disease and plans were for long-  acting Imdur as well as despite Imdur and continue with chest pain  Ranexa would be added.   PAST MEDICAL HISTORY:  Does include coronary disease with stents as  stated,  also tachybrady syndrome with paroxysmal atrial fibrillation.  The patient is on Coumadin and bradycardia, receiving a permanent  transvenous pacemaker in January 2010, a VVI pacemaker.  Atrial lead  could not be placed.  It is a Medtronic Adapta and it is time for his 3-  month interrogation and adjustment.  Other history includes COPD.  He  stopped smoking after 56 years, approximately 1 year ago  he has  obstructive sleep apnea, uses CPAP with oxygen, has diabetes mellitus 2  insulin dependent, hypertension, but more recently borderline blood  pressure.  He has renal insufficiency, mild LV dysfunction, EF 40-45%,  recent edema treated with increase Lasix and the medications adjusted,  history of mild anemia, history of thrombocytopenia as well as  leukopenia and renal insufficiency episodically.   ALLERGIES:  PENICILLIN.   OUTPATIENT MEDICATIONS:  1. Lasix 40 mg daily  2. Coumadin 10 mg daily  3. Lisinopril 10 mg daily  4. Imdur 30 mg daily  5. Aldactone 25 mg daily  6. Glipizide 10 mg daily  7. Actos  45 mg daily  8. NovoLog sliding scale insulin  9. Aspirin 81 mg daily  10.Nitroglycerin sublingual p.r.n.  11.Flomax 0.4 mg daily  12.Coreg 3.125 mg b.i.d.  13.Plavix 75 mg daily  14.Citalopram 40 mg daily  15.CPAP with oxygen at night.   REVIEW OF SYSTEMS:  GENERAL:  He has allergy type upper sinus irritation  with runny nose.  PULMONARY:  Continued shortness of breath.  Has been  seen by Dr. Delton Coombes and told he thought was more cardiac related; of  course this was prior to his pacemaker.  At times the patient cannot  walk to the bathroom back to his sofa without increased shortness of  breath, but that has been stable.  He does have obstructive sleep apnea  with CPAP and oxygen concentration since 1997.  Diabetes mellitus 2, fairly well controlled.  Hypertension, now stable.  Recent renal insufficiency with hospitalization, no improved.  Also  thrombocytopenia for heme.   NEURO:  No syncope.  No lightheadedness.  GI:  No diarrhea, constipation or melena.  GU:  No hematuria, dysuria.   VITAL SIGNS:  Today blood pressure 107/53, respirations 17, pulse 55,  temperature 97.3.  GENERAL:  Obese white male in no acute distress, pleasant affect.  SKIN:  Warm and dry.  Brisk capillary0 refill.  HEENT:  Normocephalic, sclerae clear.  NECK:  Supple.  No JVD, no bruit.  LUNGS:  Clear without rales, rhonchi or wheezes.  HEART:  Sounds S1, S2.  Regular rate and rhythm without obvious murmur,  gallop, rub or click.  ABDOMEN:  Obese, soft, nontender, positive bowel sounds.  Cannot palpate  liver, spleen or masses.  LOWER EXTREMITIES:  With trace edema, posterior tib present bilaterally.  NEURO:  Alert and oriented x3.  Moves all extremities.   Labs and chest x-ray are pending.  EKG:  Sinus brady, left bundle-branch  block, at times paced, ventricular pacing, no acute changes.   IMPRESSION:  1. Angina.  2. Coronary disease with multiple stents at that were patent in the      last cath in February of 2010.  3. Paroxysmal atrial fibrillation and bradycardia requiring permanent      transvenous pacemaker, VVI mode, rate 55, Medtronic and it is time      to interrogate and adjust the settings.  4. Obstructive sleep apnea with continuous positive airway pressure      oxygen.  5. Diabetes mellitus type 2.  6. Chronic obstructive pulmonary disease.   PLAN:  Will check labs, probably keep overnight, but will have MD to see  prior to that decision.  It may be time to add Ranexa to the patient's  medical regimen.      Darcella Gasman. Ingold, N.P.    ______________________________  Thereasa Solo Little, M.D.    LRI/MEDQ  D:  11/29/2008  T:  11/29/2008  Job:  [id number]   cc:   Thereasa Solo. Little, M.D.  Alfonse Alpers. Dagoberto Ligas, M.D.

## 2010-12-08 NOTE — Op Note (Signed)
NAME:  Bruce Taylor, Bruce Taylor NO.:  [id number]   MEDICAL RECORD NO.:  [id number]          PATIENT TYPE:  AMB   LOCATION:  DSC                          FACILITY:  [facility]   PHYSICIAN:  Cindee Salt, M.D.       DATE OF BIRTH:  04-Apr-1941   DATE OF PROCEDURE:  03/15/2008  DATE OF DISCHARGE:                               OPERATIVE REPORT   PREOPERATIVE DIAGNOSIS:  Infection olecranon bursa, right elbow.   POSTOPERATIVE DIAGNOSIS:  Infection olecranon bursa, right elbow.   OPERATION:  Incision and drainage olecranon bursa, right elbow.   SURGEON:  Cindee Salt, MD   ASSISTANT:  Carolyne Fiscal RN   ANESTHESIA:  Forearm-based upper arm IV regional.   DATE OF OPERATION:  March 15, 2008.   ANESTHESIOLOGIST:  Quita Skye. Krista Blue, MD   HISTORY:  The patient is a 70 year old male with a history of a drainage  over the olecranon bursa of his right elbow.  He is admitted for  incision and drainage.  Cultures have proved to be Staph sensitive to  all antibiotics.  This has responded partially to conservative  treatment.  He is on Coumadin, this has been stopped for incision and  drainage.  He has been treated with Septra.  He is admitted now for I&D  with a INR within 2.0.  He is aware of risks and complications including  infection, recurrence, injury to arteries, nerves, tendons; incomplete  relief of symptoms, and dystrophy.  Preoperative area the patient is  seen, the extremity marked by both the patient and surgeon.   PROCEDURE:  The patient was brought to the operating room where an upper  arm IV regional anesthetic was carried out without difficulty.  He was  prepped using DuraPrep, supine position, right arm free.  A time-out was  taken.  A longitudinal incision was made over the area of granulation  tissue, carried down following the sinus tract.  This traced down into  the olecranon bursa.  This area was opened.  This was then copiously  irrigated with saline.  This was then packed  with iodoform gauze.  A  sterile compressive dressing was applied.  The patient tolerated the  procedure well and was taken to the recovery room for observation in  satisfactory condition.  He will be discharged home to return to the  [facility] in [place] on Monday.  He is discharged on Vicodin.  He  continues on his Septra.           ______________________________  Cindee Salt, M.D.     GK/MEDQ  D:  03/15/2008  T:  03/16/2008  Job:  [id number]

## 2010-12-08 NOTE — Cardiovascular Report (Signed)
NAME:  Bruce Taylor, Bruce Taylor NO.:  [id number]   MEDICAL RECORD NO.:  [id number]          PATIENT TYPE:  OIB   LOCATION:  2899                         FACILITY:  [facility]   PHYSICIAN:  Thereasa Solo. Little, M.D. DATE OF BIRTH:  May 30, 1941   DATE OF PROCEDURE:  09/02/2008  DATE OF DISCHARGE:  09/02/2008                            CARDIAC CATHETERIZATION   INDICATIONS FOR TEST:  This 70 year old male has obstructive sleep  apnea, hypertension, hyperlipidemia, COPD, and diabetes.  Over the last  10 years, he has had multiple stents placed to all vessels.  He has  never had a 3-vessel disease at one point in time to require bypass  surgery.  His last cath on July 30, 2008, showed a 45% ejection  fraction.  At that time, he had intervention to in-stent restenosis in  the OM system and cutting balloon to a bifurcation lesion in the same OM  system.   Despite this, he continues to complain of nitroglycerin-responsive chest  pain.   He is brought in for outpatient cardiac catheterization.  His Coumadin  has been placed on hold.   COMPLICATIONS:  None.   EQUIPMENT:  5-French Judkins configuration catheters.   TOTAL CONTRAST USED:  90 mL.   RESULTS:  Central aortic pressure was 114/60.   CORONARY ANGIOGRAPHY:  On fluoroscopy, multiple stents were appreciated.   1. Left main normal.  It trifurcated.  2. Circumflex.  The ongoing circumflex has areas of 30-40% narrowing,      but no high-grade stenoses.  A large OM vessel has a stent in the      proximal portion.  This is the same area that had in-stent      restenosis that was treated with a cutting balloon on July 30, 2008.  There are only mild irregularities within the stent.  The      ongoing OM bifurcates and was widely patent at both of the      bifurcations.  This too was intervened on July 30, 2008, with a      cutting balloon.  There is brisk TIMI III flow in this entire      vessel.  3. Optional diagonal.   The optional diagonal was a moderate-sized      vessel with mild irregularities throughout.  There are no high-      grade stenoses.  4. LAD.  There are stents in the proximal LAD that is widely patent.      The vessel crosses the apex and the mid and distal vessel has      minimal irregularities.  5. Right coronary artery.  The right coronary artery has stents in the      proximal and mid segment.  There are minimal irregularities within      the stent.  The distal RCA, the PDA, and the posterolateral vessels      were free of disease.   DISCUSSION:  Based on the cath data, I cannot explain his chest pain.  It clearly could be small vessel disease in view of his diabetes.  I see  nothing  other than medical therapy for him.  I plan to add long-acting  nitrates (Imdur 30 mg) and if he continues to have chest pain, we would  add Ranexa 500 mg b.i.d.   I will ask cardiac rehab to see him and try to get him set up in the  outpatient cardiac rehab program.           ______________________________  Thereasa Solo. Little, M.D.     ABL/MEDQ  D:  09/02/2008  T:  09/02/2008  Job:  [id number]   cc:   Alfonse Alpers. Dagoberto Ligas, M.D.  Catheterization Lab

## 2010-12-08 NOTE — Cardiovascular Report (Signed)
NAME:  Bruce Taylor, Bruce Taylor NO.:  [id number]   MEDICAL RECORD NO.:  [id number]          PATIENT TYPE:  INP   LOCATION:  2902                         FACILITY:  [facility]   PHYSICIAN:  Richard A. Alanda Amass, M.D.DATE OF BIRTH:  1940/08/14   DATE OF PROCEDURE:  07/30/2008  DATE OF DISCHARGE:                            CARDIAC CATHETERIZATION   PROCEDURE:  Retrograde central aortic catheterization, selective  coronary angiography via Judkins technique pre and post IC nitroglycerin  administration, LV angiogram RAO projection, abdominal aortic angiogram  hand injection, subselective LIMA injection.   COMPLICATIONS:  None.   ANESTHESIA:  1% Xylocaine local anesthesia, 5 mg Valium p.o.  premedication, fentanyl 50 mg in divided doses IV for sedation and back  discomfort during procedure.   Omnipaque dye used throughout the procedure.   BRIEF HISTORY:  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 Dagoberto Ligas and cardiac care of Dr. Caprice Kluver.  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  He  presented to the office today with chest pain, several weeks of weakness  and fatigue, and episodic dizziness without syncope, was found to be in  complete heart block.  He was sent to the emergency room.  He had  transient hypotension, was trying to urinate, but otherwise blood  pressure remained stable at 100-110 with CHP at a rate of 36.  Pacing  pads were put on, but were not required in the ER.  Initial enzymes were  negative.  He had multiple episodes of chest pain over the last several  weeks.  Recurrent chest pain today and in the ER after becoming  transiently hypotensive had substernal chest discomfort.   Past coronary history is extensive and dates back to PCI in 1998 of the  CxOM, and between 1998 and 2004, he had non-DES stents placed to the  CxOX and to the superior and inferior bifurcation branches of the OM,  proximal LAD, proximal and mid RCA.  He has had multiple  catheterizations since that time for chest pain which is between 2004  and 2007, and has not required reintervention with no significant  restenosis.  He has been treated medically for his coronary disease,  stopped smoking about 9 months ago.  EF has been from 40-45% without  overt heart failure on medical therapy which includes diuretics.   The patient has a history of PAF, has required cardioversions remotely  x2 with no recent AF, but has been on Coumadin.  Coumadin was held  recently because of elevated INR and INR today is 1.2, BUN/creatinine  25/1.41.   It was felt best to proceed with catheterization in this setting and  this decision was reviewed and discussed with his primary cardiologist,  Dr. Clarene Duke.  Informed consent was obtained to proceed.   PROCEDURE:  The right groin was  prepped, draped in the usual manner, 1%  Xylocaine was used for local anesthesia.  The RCFA and RCFE were entered  with single anterior punctures using modified Seldinger technique with  an 18 thin-wall needle, and 6-French arterial, 6-French venous short  side arm sheaths were inserted without difficulty despite his somewhat  hostile groin, because of his obesity, and past procedures.  Guidewire  exchange was used throughout the procedure.  Initially, temporary  transvenous pacemaker was placed in the RV apex under fluoroscopic  control.  The patient was paced at 60 per minute VVI and blood pressure  remained stable at 110-130 mmHg.  Diagnostic coronary angiography was  then done with #6-French 4-cm taper preformed Cordis coronary and  pigtail catheters.  Subselective LIMA  was done with the right coronary  catheter.  IC TNG was given 200 mcg.  LV angiogram was done at 25 mL, 14  mL per second in the RAO projection in attempt to limit dye.  No LAO  angiogram was done.  Hand injection of the abdominal aorta was done  showing single patent renal arteries bilaterally.  There was ectasia of  the infrarenal abdominal aorta and possible infrarenal fusiform  aneurysm, but this can be determined later.  No significant aneurysm was  seen on limited angiography.  Catheter was removed.  Side arm sheath was  flushed.  The patient was given 25 mg of fentanyl x2 for back pain and  chest discomfort as well as IC nitroglycerin for chest discomfort which  was effective.  He essentially tolerated the procedure well.   LV angiogram in the RAO projection showed hypo-akinesis of the basilar  inferior wall, hypokinesis of the mid-inferior wall, hypokinesis of the  basilar anterolateral wall.  EF however was approximately 40-45% with  fairly good anterolateral wall and apical contraction.  There was no  significant mitral regurgitation present.   There was no left subclavian stenosis and the LIMA was widely patent on  subselective injection.   Abdominal angiogram showed single patent renal arteries bilaterally with  possible fusiform dilatation of the infrarenal aorta.   The main left coronary artery was normal with no significant stenosis  along.   The LAD had a stent beyond the thin moderately long first diagonal which  had less than 30% narrowing with good residual lumen widely patent and  no significant restenosis.  There was diffuse 40-50% narrowing of the  mid-LAD, but it was a moderately large vessel that coursed the apex and  undersurface of the heart with good flow.   The first diagonal was a small thin vessel that had 70-80% proximal  narrowing.   The first marginal arose very proximally had 80% to 70% calcification  and narrowing, was of moderate size and  trifurcated.   The circumflex proper had tandem 50%, concentric and eccentric lesions  in the midportion.  The distal branches had 70-80% lesion.   The OM2 that was previously stented showed 90-95% eccentric in-stent  restenosis fairly focal in the mid to distal third of the stent.   Both the superior and inferior bifurcation branches of the stent of the  OM stents were widely patent.  There was a 50-60% narrowing of the  proximal portion of the inferior bifurcation branch.  There was good  flow to both large branches.   The right coronary was a dominant vessel.  The proximal third was  stented and there was 50% ISR in the distal portion of the stent across  the RV  branch.  There was another 50% in the midportion beyond this.  It  then appeared that the mid RCA was stented and there was 50-60 and 70%  diffuse narrowing of the distal half of the overlapping mid RCA stent,  but good flow.  The PLA was widely patent.  The PDA had smooth 60-70%  mildly segmental proximal narrowing.   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  We would like to avoid Plavix  therapy if that is possible.  Because of his chest pain syndrome at  home, in the hospital, in ER, and in the lab, I believe we will need to  proceed with PCI in this setting.  He has diffuse disease that is  noncritical particularly of the DX1, OM1, mid RCA, and proximal PDA.  His high-grade lesion which may be is culprit lesion is the proximal  portion of the bifurcating OM with ISR and this is amenable with PCI.   The LAD stent has no restenosis.  The superior and inferior bifurcation  branches of the OM have no restenosis and there is moderate restenosis  of proximal RCA branch and moderately severe of the mid RCA stent.   The patient is at risk of progression of disease.  I would prefer to   approach this with PCI and subsequent pacemaker implantation since he is  at high risk for surgery because of his comorbid problems.  He may  eventually require this, however.   I have discussed this with Dr. Clarene Duke and reviewed the case with Dr.  Jacinto Halim.  Attempt to do PCI with possible cutting balloon atherectomy of  the proximal circumflex and avoid having the use Plavix may be the best  option.  If result is not acceptable, then sandwich stenting might be  necessary and we would probably do a non-DES stent since the patient  does require chronic Coumadin therapy.   CATHETERIZATION DIAGNOSIS:  1. Atherosclerotic heart disease - Multiple prior percutaneous      coronary interventions for chronic coronary disease.      a.     OM 3.0 AVE stent, September 10, 1996.      b.     RCA, non-DES [address] stent, August 14, 1996.      c.     RCA 3.0, non-DES stent, October 05, 1999.      d.     Non-DES superior and inferior bifurcation branch stents,       October 04, 2002.      e.     Remote OD stent and LAD stent 3.0 and 3.5 DUET respectively,       February 17, 1998      f.     No in-stent restenosis on multiple followup caths 2004 -       last cath, July 22, 2006.  2. Unstable angina associated with complete heart block, culprit      lesion ISR proximal OM2 with patent LAD, patent superior and      inferior OM and patent RCA stents.  3. Moderate in-stent restenosis, proximal mid RCA and OM1 70-80%.  4. No LAD stent restenosis.  5. LV dysfunction, EF 40-45%  6. Insulin-dependent diabetes mellitus.  7. Exogenous obesity, severe.  8. Obstructive sleep apnea.  9. Hyperlipidemia.  10.Systemic hypertension.  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  12.Mild chronic renal insufficiency.  13.Chronic obstructive pulmonary disease, quit smoking 9 months ago.      Richard A. Alanda Amass, M.D.  Electronically Signed     RAW/MEDQ  D:  07/30/2008  T:   07/31/2008  Job:  [id number]   cc:   Alfonse Alpers. Dagoberto Ligas, M.D.  Thereasa Solo. Little, M.D.  Cristy Hilts. Jacinto Halim, MD

## 2010-12-08 NOTE — Cardiovascular Report (Signed)
NAME:  Bruce Taylor, Bruce Taylor NO.:  [id number]   MEDICAL RECORD NO.:  [id number]          PATIENT TYPE:  INP   LOCATION:  2902                         FACILITY:  [facility]   PHYSICIAN:  Cristy Hilts. Jacinto Halim, MD       DATE OF BIRTH:  1941-01-17   DATE OF PROCEDURE:  07/30/2008  DATE OF DISCHARGE:                            CARDIAC CATHETERIZATION   PROCEDURES PERFORMED:  1. Percutaneous transluminal coronary angioplasty and cutting balloon      angioplasty of the in-stent restenotic obtuse marginal-1 branch of      the circumflex coronary artery.  2. Successful percutaneous transluminal coronary angioplasty and      AngioScore angioplasty of secondary branch of the obtuse marginal-      1.   INDICATION:  Bruce Taylor is a 67-year gentleman who has known  coronary artery disease had had multiple coronary interventions in the  past.  He had undergone stenting to his obtuse marginal branch with a  non-drug-eluting 3-mm stents and this stent extended into the secondary  obtuse marginal-1 and distal branch.  So, there were a total of 3 stents  into the obtuse marginal-1 and bifurcation stenting distally.  This was  all in the mid segment of the obtuse marginal-1.  He was admitted this  morning with complete heart block requiring placement of temporary  pacemaker implantation, and coronary angiography revealed a high-grade  in-stent restenosis and also a new 90% stenosis in these superior branch  of the obtuse marginal-1 distal to the previously placed stent.  I was  called for evaluation for possible angioplasty.  The patient was having  ongoing chest discomfort in the Cath Lab.   INTERVENTION DATA:  1. Successful PTA and cutting balloon angioplasty of the in-stent      restenotic obtuse marginal-1 stent with reduction of stenosis from      90% to 0% with brisk TIMI 3 to TIMI 3 flow maintained at the end of      the procedure.  A 3.0 x 6 mm cutting balloon was utilized and   multiple cuts were made.  2. Successful PTCA and AngioScoring of the superior branch of the      obtuse marginal-1.  This stenosis was reduced from 90% to 0% with      brisk TIMI 3 to TIMI 3 flow maintained at the end of the procedure.   RECOMMENDATIONS:  The patient will be started on Integrilin.  The  patient will need permanent transvenous pacemaker implantation.  Hence,  no Plavix has been given at this time.  He will be continued on aspirin  and Integrilin alone and the Integrilin can be stopped couple of hours  prior to his pacemaker implantation, and postprocedure, we can start him  back on aspirin and Plavix.  His cardiac markers would be followed  through.   A total of 260 mL of contrast was utilized both for diagnostic and  interventional procedure.   Please see the dictation from Dr. Susa Griffins for diagnostic  angiography.   TECHNIQUE OF THE PROCEDURE:  The 5-French right femoral arterial sheath  was exchanged  to a 7-French sheath.  Using a 7-French SL-4 guide and  using Angiomax for anticoagulation, a Cougar 0.014th of an inch  guidewire was utilized to cross into the circumflex coronary artery  obtuse marginal branch.  Having performed this, a 3.0 x 6 mm cutting  balloon was utilized, and with great difficulty, I was able to cross the  high-grade lesion.  Multiple inflations were performed at the peak of 11  atmospheric pressure.  Having performed this from anywhere from 30  seconds to 90 seconds, angiography revealed excellent results.  Having  performed this, the superior branch and the inferior branch of the  obtuse marginal-1 have previously placed stents and these were widely  patent.  However distal to the superior branch, distal to the stent,  there was a 90% stenosis.  I target the lesion and carefully positioned  the Cougar wire and a 2.0 x 10 mm angioscope balloon was utilized and  multiple cuts from 3 atmospheric pressure to a peak of 5 atmospheric   pressure from 50-100 seconds were performed and angiography was  repeated.  Excellent results were noted without any evidence of  dissection or thrombus.  Brisk TIMI 3 flow was noted.  Having performed  this, the wire was withdrawn, angiography repeated.  Guide catheter  disengaged and pulled out of the body.  The patient tolerated the  procedure well.  There was no immediate complication noted.      Cristy Hilts. Jacinto Halim, MD  Electronically Signed     JRG/MEDQ  D:  07/30/2008  T:  07/31/2008  Job:  [id number]   cc:   Thereasa Solo. Little, M.D.  Alfonse Alpers. Dagoberto Ligas, M.D.

## 2010-12-08 NOTE — Discharge Summary (Signed)
NAME:  GASPARD, ISBELL NO.:  [id number]   MEDICAL RECORD NO.:  [id number]          PATIENT TYPE:  INP   LOCATION:  3709                         FACILITY:  [facility]   PHYSICIAN:  Thereasa Solo. Little, M.D. DATE OF BIRTH:  03-Aug-1940   DATE OF ADMISSION:  07/30/2008  DATE OF DISCHARGE:  08/04/2008                               DISCHARGE SUMMARY   DISCHARGE DIAGNOSES:  1. Shortness of breath and chest pain, nitroglycerin responsive.  2. Increased dyspnea on exertion.  3. Intermittent dizziness with falling.  4. Complete heart block with heart rate 43, right bundle-branch block,      and left axis deviation.  4A.  Temporary pacemaker placed.  4B.  Permanent transvenous pacemaker placed with a Medtronic Adapta,  July 31, 2008, and removal of temporary pacemaker.  Please note  permanent pacemaker is in VVI mode.  1. Mild increase in troponin, secondary to angina and complete heart      block.  2. History of paroxysmal atrial fibrillation.  6A.  Anticoagulation, normally followed by Dr. Dagoberto Ligas, Coumadin levels  and INRs.  1. Mild decrease in ejection fraction 40-45%.  2. Volume overload, treated with diuretics, resolved.  3. Chronic obstructive pulmonary disease.  4. Obstructive sleep apnea, on continuous positive airway pressure.  5. Mild anemia.  6. History of hypertension, though this admission more hypotensive.  7. Diabetes mellitus 2, insulin dependent.  13A.  The patient was hypoglycemic during hospitalization, was never  placed on 70/30 insulin, secondary to decrease glucose, but continued on  glipizide and Actos, was on sliding scale, but minimal use of sliding  scale or criteria for insulin was sliding scale.  1. Thrombocytopenia prior to discharge.  2. Leukopenia prior to discharge was on vancomycin IV.  3. Renal insufficiency during hospitalization, resolved.  4. Known coronary artery disease.  He has stents prior to this      admission to the obtuse  marginal stent to the left anterior      descending and stents to the right coronary artery.  Ejection      fraction was known to be 40%.  With cardiac catheterization, he was      found to have 90% in-stent restenosis of the mid circumflex      undergoing percutaneous transluminal coronary angioplasty and      cutting balloon angioplasty by Dr. Jacinto Halim as well as the second      obtuse marginal 90% stenosis reduced to 0%.   DISCHARGE CONDITION:  Improved.   PROCEDURES:  On July 30, 2008, combined left heart cath by Dr. Susa Griffins.   On July 30, 2008, PCI of the mid circumflex and the OM2 by Dr. Yates Decamp including angioplasty cutting balloon atherectomy and placement,  July 30, 2008 of permanent transvenous pacemaker.  On July 31, 2008,  placement of permanent transvenous pacemaker Adapta Medtronic, serial  number [id number] by Dr. Ritta Slot, placed in a VVIR and discharge  rate was 55.  Attempted atrial lead placement, but unsuccessful.   On July 31, 2008, intubation in anticipation of permanent transvenous  pacemaker, secondary to sleep apnea  by anesthesia.   DISCHARGE MEDICATIONS:  1. Coumadin 10 mg daily, which was started on August 03, 2008.  2. Lasix 40 mg daily.  3. Aspirin 81 mg daily.  4. Actos 45 mg daily.  5. Zocor 80 mg daily.  6. Glipizide XL 10 mg daily.  7. Lisinopril 20 mg daily.  8. Celexa 40 mg daily.  9. Albuterol inhaler as needed.  10.Plavix 75 mg daily.  11.Flomax 0.4 mg daily.  12.Coreg 3.125 mg 1 twice a day.  13.Nitroglycerin __________ 1 under the tongue as needed for chest      pain while sitting, one every 5 minutes up to 3 over 15 minutes; if      no better call 911.  14.Stop Lopressor.  15.Stop Cardizem CD.  16.Stop insulin 70/30.   Please note, your glucose in the hospital was lower, so you have not  received your 70/30 insulin.  Monitor your glucose at home and call Dr.  Dagoberto Ligas, you may need a lower dose of 70/30.   Please note, here in the  hospital, we had the patient take 70 units in the morning and 30 units  in the evening.  Actually, the patient's glucose had been running low at  home, and he would adjust his insulin at home as needed for lower  glucose.  Frequently, he did not take any before bedtime.   1. Continue CPAP as before.  2. Coumadin Clinic on Friday at our office as you have an appointment      with Dr. Lynnea Ferrier.  3. Not over 64 ounces of liquid daily.  4. Low-sodium heart-healthy diabetic diet.  5. Pacemaker instruction sheet given with wound instructions.  6. Wash cath site with soap and water.  Call if any bleeding,      swelling, or drainage.  7. Increase activity slowly.  No lifting for 1 week.  No driving for 1      week.  He could drive on August 07, 2008.  If he begin having more      shortness of breath call.  8. You will follow up with Dr. Lynnea Ferrier this week, the office will call      on Monday with date and time.  25  You will follow up  with Dr. Clarene Duke in 2-3 weeks, the office will  call with date and time.  1. Have Coumadin level checked as stated.   Please note, the patient was set up for home health, a walker, a 3-in-1  and physical therapy at home.   HISTORY OF PRESENT ILLNESS:  This is a 70 year old white married male  with known coronary artery disease with last cath in December 2007.  At  that time, his stent to the OM had less than 30% re-stenosis; stent to  the LAD, 30-40%; stent to the RCA, less than 50% and EF was 40%.  Over  the last 2-3 months, the patient had been having intermittent dizziness,  following there was no syncope.  Shortness of breath at rest and  increased dyspnea on exertion, also had some chest pain lasting 2-3  minutes, nitroglycerin responsive.  He presented to Dr. Fredirick Maudlin office  on July 30, 2008, with these complaints and was found to be in  complete heart block.  Heart rate in the 40-43.  He also had a right  bundle-branch  block and left axis deviation.  He was then sent to [facility] for cardiac cath and temporary pacemaker.   PAST  MEDICAL HISTORY:  1. PAF.  He is on Coumadin followed by Dr. Dagoberto Ligas.  2. Obstructive sleep apnea and COPD.  He did stop smoking in March      2009.  3. History of diabetes.  Recently, at discharge the patient admitted      that he had been taking less insulin because his glucose was      running much lower.  4. He has a history of hypertension and hyperlipidemia.   FAMILY HISTORY/SOCIAL HISTORY/REVIEW OF SYSTEMS:  See H and P.   PHYSICAL EXAMINATION AT DISCHARGE:  On August 04, 2008; blood pressure  128/58, pulse 64, pacer site left subclavian area was not edematous,  minimal bruising.  Heart rate was in the 60s, respiratory rate is 18,  temp 98.7, and minimal fever of 99 prior to discharge, and sats 99%.   Please note; up until discharge, the patient had been getting Lasix 40  mg IV twice a day, also vancomycin per pharmacy.  He was supposed to  have 72 hours of vancomycin after the permanent pacemaker, but due to  renal insufficiency, it was finally completed on August 04, 2008.  Also, the patient was negative 2 days prior to discharge on fluid  balance.   LABORATORY/RADIOLOGY:  Chest X-ray on admission.  Basilar atelectasis  similar to prior.  Cardiomegaly, likely small effusions.  Increased  central venous congestion suggests mild heart failure.  Follow up chest  x-ray and endotracheal tube in satisfactory position, left subclavian  single lead pacemaker at the expected location of the RV apex.  Stable  mild heart failure.  No lingular atelectasis on follow up PA and lateral  chest x-ray, August 01, 2008.  The pacemaker lead was in stable  position.  There is slight kinking of the wire below the clavicle.  Heart is enlarged.  There is improved aeration of the lung bases.  No  definite edema remains.  The patient has been extubated and persistent   cardiomegaly.   LABORATORY DATA:  Hemoglobin on admission 10.9, hematocrit 33.6, WBC  7.8, platelets 202 and MCV 90.7.  Hemoglobin was slow to drift down as  well as platelets were slowly drifting down during hospitalization.  At  the time of discharge, hemoglobin 9.2, hematocrit 27.2, WBCs 3,  platelets 126, and MCV 88.7.   Chemistry on admission:  Sodium 136, potassium 4.6, chloride 103, CO2 of  24, BUN 25, creatinine 1.41, and glucose 120.  These remained stable,  though creatinine did peak on August 01, 2008, to 2.26; prior to  discharge, it was 1.43 with a BUN of 29.  Sodium 134, potassium 4.1,  chloride 103, CO2 of 25 and glucose 90.  Coags on admission, the patient  was subtherapeutic for his Coumadin with an INR of 1.2, PTT 39, and  Protime of 15.8, and prior to discharge, after one dose of Coumadin the  night before, Protime 13.9 and an INR of 1.  GI:  LFTs were normal with  AST 19, ALT 15, alkaline phos 58, total bili 0.7, and albumin 3.3.   Cardiac enzymes.  CK were all negative ranged 81, 93, 76.  MB 1.5, 2.5,  and 2.8.  Troponin I was 0.01, slight bump to 0.16, this was after the  intervention.  Electrolytes, calcium 8.3 and magnesium 2.1.  UA was  clear.  Glycohemoglobin 7.6, TSH 2.922, and uric acid 7.6.  BNP on  August 01, 2008, was elevated at 541 and on August 02, 2008, it was  671.  EKGs on admission, complete heart block with junctional escape rhythm,  right bundle-branch block and left anterior fascicular block, heart rate  was 36.  Follow up after a temporary pacemaker, the patient was  ventricular pacing at 70, again underlying complete heart block.   On July 31, 2008, the patient with underlying AFib and ventricular  pacing.  The pacemaker was decreased to 55.  On August 01, 2008, the  patient was in ventricular pacing, __________ underlying rhythm.   HOSPITAL COURSE:  Mr. Carsen Leaf was admitted by Dr. Julieanne Manson from Dr. Fredirick Maudlin office after  presenting with chest pain,  shortness of breath, dyspnea on exertion, and dizziness with frequent  falls, was found to be in complete heart block with heart rates in the  40s and mild volume overload.  He was admitted to [facility]  and once found his INR was less than 1.6, he was taken to the cath lab  and underwent cardiac catheterization with Dr. Alanda Amass and  interventions with angioplasties and atherectomies by Dr. Jacinto Halim to two  vessels and a temporary pacemaker was placed as well.  The patient  tolerated the procedure well and he was transferred to the CCU.  The  next morning, plans were for permanent transvenous pacemaker.   On the morning of the pacemaker, the patient was intubated due to his  history of sleep apnea, his obesity, and having to be flat on the table  with a large chest, was most likely more complex procedure because of  the patient's body habitus.  The patient underwent permanent transvenous  pacemaker, unable to successfully place the atrial lead.  The patient  had ventricular lead placed and has a VVIR pacemaker now.  The patient  was slowly weaned off the ventilator and by the next day, he was much  more stable, still sounded volume overload and he was given IV Lasix.   MEDICATIONS:  Restarted including Coreg.   Hospitalization was interesting in that one and the patient is diabetic  and on insulin at home.  We were unable to give him the 70/30 here in  the hospital due to his hypoglycemia.  Therefore, he was not discharged  with 70/30, though he checks his own Accu-Cheks and adjust his insulin  at home anyway, but he will check in with Dr. Dagoberto Ligas for closer follow  up.  His Coumadin for his PAF was restarted prior to discharge at 10 mg  and we will let him slowly drift up, though he was nontherapeutic, on  arrival, he may need a higher dose anyway.  He was discharged with 1  aspirin, Plavix 75, and the Coumadin with awareness of bleeding issues.   Additionally, he was anemic with some thrombocytopenia.  We will in  addition doing a Coumadin check on Friday.  We will have CBC and a basic  metabolic panel done as well just to ensure his labs are stable when he  sees Dr. Lynnea Ferrier.  By the morning of August 02, 2007, he was ambulated  with cardiac rehab.  He was doing fairly well, though it is difficult  for him to get up and out of bed.  He is stable once he is up out of the  bed.  Sats remained stable at 95% on room air with ambulation.  Continued to improve, transferred to telemetry unit.  He did have issues  feeling shocked in his chest, pacemaker was free interrogated and no  abnormalities were seen  on the interrogation.  His Foley was  discontinued on the August 03, 2008, and continued ambulation and  physical therapy also saw him and recommended home health PT.  They also  recommended purchase of a temporary bed rail, which may need to be  addressed.  The patient will see how he does at home.   By August 04, 2008, the patient was stable and ready for discharge home  and was anxious to go.  He will follow up with Dr. Dagoberto Ligas as well as Dr.  Lynnea Ferrier and Dr. Clarene Duke.       Darcella Gasman. [place], N.P.    ______________________________  Thereasa Solo Little, M.D.    LRI/MEDQ  D:  08/04/2008  T:  08/05/2008  Job:  [id number]   cc:   Thereasa Solo. Little, M.D.  Alfonse Alpers. Dagoberto Ligas, M.D.  Ritta Slot, MD

## 2010-12-09 ENCOUNTER — Encounter (HOSPITAL_COMMUNITY): Payer: Medicare Other

## 2010-12-11 ENCOUNTER — Encounter (HOSPITAL_COMMUNITY): Payer: Medicare Other

## 2010-12-11 NOTE — Consult Note (Signed)
[facility]  Patient:    MARKS, SCALERA Visit Number: [id number] MRN: [id number]          Service Type: MED Location: [phone] Attending Physician:  Lorre Nick Dictated by:   Charlcie Cradle. Delford Field, M.D. [facility] Proc. Date: 07/04/01 Admit Date:  07/03/2001                            Consultation Report  Mr. Charo is a 70 year old white male with history of chronic cough, sinus congestion, URI symptoms for several months.  Then noted increased fever three days ago, temperature to 103, noted increased chest pain which was sharp and stabbing on the left side, worse with a deep breath and cough, noted increased dyspnea.  The cough is dry and nonproductive.  The patient has smoked two packs a day for 14 years.  Is a retired [employer].  No previous history of known pneumonia.  Does have a history of MI with coronary artery disease with previous PCIs, history of diabetes and hypertension, increased cholesterol. Patients symptoms have persisted.  Comes to the hospital.  Has left lower lobe consolidation and volume loss with narrowing of the superior segment left lower lobe.  PAST MEDICAL HISTORY:  As noted above.  Previous appendectomy, tonsillectomy, gastroesophageal reflux disease, obesity, depression.  CURRENT MEDICATIONS:  Lexapro, Prilosec, Niaspan, Prinivil, Glucophage, Imdur, Zocor, Actos, and aspirin.  ALLERGIES:  PENICILLIN.  FAMILY HISTORY:  Cancer in the mother and father.  SOCIAL HISTORY:  Lives in [place].  Is married.  Does not drink.  Still actively smokes.  PHYSICAL EXAMINATION  VITAL SIGNS:  Tmax 101.5, blood pressure 150/74, heart rate 114, respirations 20, saturation 96% room air.  CHEST:  Consolidated changes and rales in the left lower lobe, dullness to percussion half-way up on the left side.  CARDIAC:  Regular rate and rhythm without S3.  Normal S1, S2.  ABDOMEN:  Soft, nontender.  Bowel sounds  active.  EXTREMITIES:  No edema, clubbing, or venous disease.  NEUROLOGIC:  Intact.  HEENT:  Oropharynx:  Clear.  NECK:  No jugular venous distention, lymphadenopathy.  Supple.  LABORATORIES:  Sodium 130, potassium 4.0, chloride 97, CO2 22, BUN 24, creatinine 1.4, blood sugar 223.  White count 26,000, hemoglobin 15.  Chest x-ray and CT scan of chest showed left lower lobe consolidation and narrowing of the superior segment left lower lobe.  IMPRESSION:  Left lower lobe consolidation with probable post obstructive findings on CT of the chest.  Need to rule out malignancy.  RECOMMENDATIONS:  Will pursue bronchoscopy on July 05, 2001.  Agree with IV Tequin and nebulizer treatments as outlined.  Will follow with you. Dictated by:   Charlcie Cradle Delford Field, M.D. [facility] Attending Physician:  Lorre Nick DD:  07/04/01 TD:  07/04/01 Job: [id number]

## 2010-12-11 NOTE — Procedures (Signed)
[place]  Patient:    Bruce Taylor, Bruce Taylor Visit Number: [id number] MRN: [id number]          Service Type: MED Location: [phone] Attending Physician:  Selina Cooley Dictated by:   Charlcie Cradle. Delford Field, M.D. [facility] Proc. Date: 07/05/01 Admit Date:  07/03/2001   CC:         Selina Cooley, M.D.   Procedure Report  PROCEDURE:  Bronchoscopy.  INDICATION:  Left lower lobe infiltrate.  Evaluate for endobronchial lesion.  OPERATOR:  Charlcie Cradle. Delford Field, M.D.  ANESTHESIA:  1% Xylocaine local.  PREOPERATIVE MEDICATIONS:  Demerol 50 mg IV push, Versed 5 mg IV push.  DESCRIPTION OF PROCEDURE:  The Olympus video bronchoscope was introduced to the right naris.  The upper airways were visualized and were unremarkable. The entire tracheobronchial tree was visualized and revealed no endobronchial lesions.  Attention was paid particularly to the left lower lobe orifice and the left lower lobe superior segment.  No endobronchial lesions were identified.  Transbronchial biopsies and bronchial washes were obtained from the left lower lobe.  COMPLICATIONS:  None.  IMPRESSION:  Left lower lobe pneumonia.  No evidence of postobstructive nature to this.  RECOMMENDATIONS:  Follow up pathology and microbiology. Dictated by:   Charlcie Cradle Delford Field, M.D. [facility] Attending Physician:  Selina Cooley DD:  07/05/01 TD:  07/06/01 Job: [id number]

## 2010-12-14 ENCOUNTER — Encounter (HOSPITAL_COMMUNITY): Payer: Medicare Other

## 2010-12-16 ENCOUNTER — Encounter (HOSPITAL_COMMUNITY): Payer: Medicare Other

## 2010-12-18 ENCOUNTER — Encounter (HOSPITAL_COMMUNITY): Payer: Medicare Other

## 2010-12-21 ENCOUNTER — Encounter (HOSPITAL_COMMUNITY): Payer: Medicare Other

## 2010-12-23 ENCOUNTER — Encounter (HOSPITAL_COMMUNITY): Payer: Medicare Other

## 2010-12-25 ENCOUNTER — Encounter (HOSPITAL_COMMUNITY): Payer: Medicare Other | Attending: Cardiology

## 2010-12-25 DIAGNOSIS — Z880 Allergy status to penicillin: Secondary | ICD-10-CM | POA: Insufficient documentation

## 2010-12-25 DIAGNOSIS — G4733 Obstructive sleep apnea (adult) (pediatric): Secondary | ICD-10-CM | POA: Insufficient documentation

## 2010-12-25 DIAGNOSIS — N183 Chronic kidney disease, stage 3 unspecified: Secondary | ICD-10-CM | POA: Insufficient documentation

## 2010-12-25 DIAGNOSIS — Z9981 Dependence on supplemental oxygen: Secondary | ICD-10-CM | POA: Insufficient documentation

## 2010-12-25 DIAGNOSIS — Z9861 Coronary angioplasty status: Secondary | ICD-10-CM | POA: Insufficient documentation

## 2010-12-25 DIAGNOSIS — I2510 Atherosclerotic heart disease of native coronary artery without angina pectoris: Secondary | ICD-10-CM | POA: Insufficient documentation

## 2010-12-25 DIAGNOSIS — J449 Chronic obstructive pulmonary disease, unspecified: Secondary | ICD-10-CM | POA: Insufficient documentation

## 2010-12-25 DIAGNOSIS — E785 Hyperlipidemia, unspecified: Secondary | ICD-10-CM | POA: Insufficient documentation

## 2010-12-25 DIAGNOSIS — Z950 Presence of cardiac pacemaker: Secondary | ICD-10-CM | POA: Insufficient documentation

## 2010-12-25 DIAGNOSIS — Z5189 Encounter for other specified aftercare: Secondary | ICD-10-CM | POA: Insufficient documentation

## 2010-12-25 DIAGNOSIS — I200 Unstable angina: Secondary | ICD-10-CM | POA: Insufficient documentation

## 2010-12-25 DIAGNOSIS — I129 Hypertensive chronic kidney disease with stage 1 through stage 4 chronic kidney disease, or unspecified chronic kidney disease: Secondary | ICD-10-CM | POA: Insufficient documentation

## 2010-12-25 DIAGNOSIS — E119 Type 2 diabetes mellitus without complications: Secondary | ICD-10-CM | POA: Insufficient documentation

## 2010-12-25 DIAGNOSIS — J4489 Other specified chronic obstructive pulmonary disease: Secondary | ICD-10-CM | POA: Insufficient documentation

## 2010-12-25 DIAGNOSIS — I4891 Unspecified atrial fibrillation: Secondary | ICD-10-CM | POA: Insufficient documentation

## 2010-12-28 ENCOUNTER — Encounter (HOSPITAL_COMMUNITY): Payer: Medicare Other

## 2010-12-30 ENCOUNTER — Encounter (HOSPITAL_COMMUNITY): Payer: Medicare Other

## 2011-01-01 ENCOUNTER — Emergency Department (HOSPITAL_COMMUNITY)
Admission: EM | Admit: 2011-01-01 | Discharge: 2011-01-01 | Disposition: A | Discharge status: Home / Self Care | Payer: Medicare Other | Attending: Emergency Medicine | Admitting: Emergency Medicine

## 2011-01-01 ENCOUNTER — Encounter (HOSPITAL_COMMUNITY): Payer: Medicare Other

## 2011-01-01 DIAGNOSIS — R0602 Shortness of breath: Secondary | ICD-10-CM | POA: Insufficient documentation

## 2011-01-01 DIAGNOSIS — R791 Abnormal coagulation profile: Secondary | ICD-10-CM | POA: Insufficient documentation

## 2011-01-01 DIAGNOSIS — E789 Disorder of lipoprotein metabolism, unspecified: Secondary | ICD-10-CM | POA: Insufficient documentation

## 2011-01-01 DIAGNOSIS — I2510 Atherosclerotic heart disease of native coronary artery without angina pectoris: Secondary | ICD-10-CM | POA: Insufficient documentation

## 2011-01-01 DIAGNOSIS — E119 Type 2 diabetes mellitus without complications: Secondary | ICD-10-CM | POA: Insufficient documentation

## 2011-01-01 DIAGNOSIS — Z794 Long term (current) use of insulin: Secondary | ICD-10-CM | POA: Insufficient documentation

## 2011-01-01 DIAGNOSIS — X58XXXA Exposure to other specified factors, initial encounter: Secondary | ICD-10-CM | POA: Insufficient documentation

## 2011-01-01 DIAGNOSIS — I252 Old myocardial infarction: Secondary | ICD-10-CM | POA: Insufficient documentation

## 2011-01-01 DIAGNOSIS — J449 Chronic obstructive pulmonary disease, unspecified: Secondary | ICD-10-CM | POA: Insufficient documentation

## 2011-01-01 DIAGNOSIS — I10 Essential (primary) hypertension: Secondary | ICD-10-CM | POA: Insufficient documentation

## 2011-01-01 DIAGNOSIS — S50329A Blister (nonthermal) of unspecified elbow, initial encounter: Secondary | ICD-10-CM | POA: Insufficient documentation

## 2011-01-01 DIAGNOSIS — S40029A Contusion of unspecified upper arm, initial encounter: Secondary | ICD-10-CM | POA: Insufficient documentation

## 2011-01-01 DIAGNOSIS — T45515A Adverse effect of anticoagulants, initial encounter: Secondary | ICD-10-CM | POA: Insufficient documentation

## 2011-01-01 DIAGNOSIS — J4489 Other specified chronic obstructive pulmonary disease: Secondary | ICD-10-CM | POA: Insufficient documentation

## 2011-01-01 DIAGNOSIS — L989 Disorder of the skin and subcutaneous tissue, unspecified: Secondary | ICD-10-CM | POA: Insufficient documentation

## 2011-01-01 DIAGNOSIS — J45909 Unspecified asthma, uncomplicated: Secondary | ICD-10-CM | POA: Insufficient documentation

## 2011-01-01 DIAGNOSIS — R58 Hemorrhage, not elsewhere classified: Secondary | ICD-10-CM | POA: Insufficient documentation

## 2011-01-01 DIAGNOSIS — Z7901 Long term (current) use of anticoagulants: Secondary | ICD-10-CM | POA: Insufficient documentation

## 2011-01-01 DIAGNOSIS — IMO0002 Reserved for concepts with insufficient information to code with codable children: Secondary | ICD-10-CM | POA: Insufficient documentation

## 2011-01-01 DIAGNOSIS — Z7982 Long term (current) use of aspirin: Secondary | ICD-10-CM | POA: Insufficient documentation

## 2011-01-01 DIAGNOSIS — Z79899 Other long term (current) drug therapy: Secondary | ICD-10-CM | POA: Insufficient documentation

## 2011-01-01 LAB — COMPREHENSIVE METABOLIC PANEL
AST: 22 U/L (ref 0–37)
Albumin: 3.4 g/dL — ABNORMAL LOW (ref 3.5–5.2)
Alkaline Phosphatase: 68 U/L (ref 39–117)
BUN: 29 mg/dL — ABNORMAL HIGH (ref 6–23)
CO2: 23 mEq/L (ref 19–32)
Chloride: 103 mEq/L (ref 96–112)
GFR calc non Af Amer: 44 mL/min — ABNORMAL LOW (ref >60)
Potassium: 4.7 mEq/L (ref 3.5–5.1)
Total Bilirubin: 0.3 mg/dL (ref 0.3–1.2)

## 2011-01-01 LAB — DIFFERENTIAL
Basophils Absolute: 0 10*3/uL (ref 0.0–0.1)
Eosinophils Relative: 2 % (ref 0–5)
Lymphocytes Relative: 20 % (ref 12–46)
Lymphs Abs: 1.3 10*3/uL (ref 0.7–4.0)
Monocytes Absolute: 0.9 10*3/uL (ref 0.1–1.0)
Monocytes Relative: 14 % — ABNORMAL HIGH (ref 3–12)
Neutro Abs: 4.2 10*3/uL (ref 1.7–7.7)

## 2011-01-01 LAB — CBC
HCT: 34.7 % — ABNORMAL LOW (ref 39.0–52.0)
Hemoglobin: 11.4 g/dL — ABNORMAL LOW (ref 13.0–17.0)
MCH: 29.2 pg (ref 26.0–34.0)
MCHC: 32.9 g/dL (ref 30.0–36.0)
MCV: 89 fL (ref 78.0–100.0)
RDW: 14.9 % (ref 11.5–15.5)

## 2011-01-01 LAB — PROTIME-INR: INR: 3.63 — ABNORMAL HIGH (ref 0.00–1.49)

## 2011-01-04 ENCOUNTER — Encounter (HOSPITAL_COMMUNITY): Payer: Medicare Other

## 2011-01-06 ENCOUNTER — Encounter (HOSPITAL_COMMUNITY): Payer: Medicare Other

## 2011-01-08 ENCOUNTER — Encounter (HOSPITAL_COMMUNITY): Payer: Medicare Other

## 2011-01-11 ENCOUNTER — Encounter (HOSPITAL_COMMUNITY): Payer: Medicare Other

## 2011-01-13 ENCOUNTER — Encounter (HOSPITAL_COMMUNITY): Payer: Medicare Other

## 2011-01-15 ENCOUNTER — Encounter (HOSPITAL_COMMUNITY): Payer: Medicare Other

## 2011-01-18 ENCOUNTER — Encounter (HOSPITAL_COMMUNITY): Payer: Medicare Other

## 2011-01-20 ENCOUNTER — Encounter (HOSPITAL_COMMUNITY): Payer: Medicare Other

## 2011-01-22 ENCOUNTER — Encounter (HOSPITAL_COMMUNITY): Payer: Medicare Other

## 2011-01-25 ENCOUNTER — Encounter (HOSPITAL_COMMUNITY): Payer: Medicare Other | Attending: Cardiology

## 2011-01-25 DIAGNOSIS — E119 Type 2 diabetes mellitus without complications: Secondary | ICD-10-CM | POA: Insufficient documentation

## 2011-01-25 DIAGNOSIS — I2510 Atherosclerotic heart disease of native coronary artery without angina pectoris: Secondary | ICD-10-CM | POA: Insufficient documentation

## 2011-01-25 DIAGNOSIS — Z950 Presence of cardiac pacemaker: Secondary | ICD-10-CM | POA: Insufficient documentation

## 2011-01-25 DIAGNOSIS — Z880 Allergy status to penicillin: Secondary | ICD-10-CM | POA: Insufficient documentation

## 2011-01-25 DIAGNOSIS — I129 Hypertensive chronic kidney disease with stage 1 through stage 4 chronic kidney disease, or unspecified chronic kidney disease: Secondary | ICD-10-CM | POA: Insufficient documentation

## 2011-01-25 DIAGNOSIS — Z9861 Coronary angioplasty status: Secondary | ICD-10-CM | POA: Insufficient documentation

## 2011-01-25 DIAGNOSIS — I200 Unstable angina: Secondary | ICD-10-CM | POA: Insufficient documentation

## 2011-01-25 DIAGNOSIS — Z9981 Dependence on supplemental oxygen: Secondary | ICD-10-CM | POA: Insufficient documentation

## 2011-01-25 DIAGNOSIS — G4733 Obstructive sleep apnea (adult) (pediatric): Secondary | ICD-10-CM | POA: Insufficient documentation

## 2011-01-25 DIAGNOSIS — J449 Chronic obstructive pulmonary disease, unspecified: Secondary | ICD-10-CM | POA: Insufficient documentation

## 2011-01-25 DIAGNOSIS — J4489 Other specified chronic obstructive pulmonary disease: Secondary | ICD-10-CM | POA: Insufficient documentation

## 2011-01-25 DIAGNOSIS — I4891 Unspecified atrial fibrillation: Secondary | ICD-10-CM | POA: Insufficient documentation

## 2011-01-25 DIAGNOSIS — Z5189 Encounter for other specified aftercare: Secondary | ICD-10-CM | POA: Insufficient documentation

## 2011-01-25 DIAGNOSIS — N183 Chronic kidney disease, stage 3 unspecified: Secondary | ICD-10-CM | POA: Insufficient documentation

## 2011-01-25 DIAGNOSIS — E785 Hyperlipidemia, unspecified: Secondary | ICD-10-CM | POA: Insufficient documentation

## 2011-01-27 ENCOUNTER — Encounter (HOSPITAL_COMMUNITY): Payer: Medicare Other

## 2011-01-29 ENCOUNTER — Encounter (HOSPITAL_COMMUNITY): Payer: Medicare Other

## 2011-02-01 ENCOUNTER — Encounter (HOSPITAL_COMMUNITY): Payer: Medicare Other

## 2011-02-03 ENCOUNTER — Encounter (HOSPITAL_COMMUNITY): Payer: Medicare Other

## 2011-02-05 ENCOUNTER — Encounter (HOSPITAL_COMMUNITY): Payer: Medicare Other

## 2011-02-08 ENCOUNTER — Telehealth: Payer: Self-pay | Admitting: Emergency Medicine

## 2011-02-08 ENCOUNTER — Encounter (HOSPITAL_COMMUNITY): Payer: Medicare Other

## 2011-02-08 NOTE — Telephone Encounter (Signed)
Pt last OV was 10/14/2009 w/ RB.  ATC x1. NA. WCB.

## 2011-02-09 NOTE — Telephone Encounter (Signed)
Pt's spouse is a pt of [facility]'s and says her husband was very impressed with [facility] and how he handled his wife's care and would like to change from [facility] to [facility]. She is aware we will have to get approval for the change by both doctors first. Will forward to RB. Pls advise.

## 2011-02-10 ENCOUNTER — Encounter (HOSPITAL_COMMUNITY): Payer: Medicare Other

## 2011-02-10 ENCOUNTER — Ambulatory Visit (INDEPENDENT_AMBULATORY_CARE_PROVIDER_SITE_OTHER)
Admission: RE | Admit: 2011-02-10 | Discharge: 2011-02-10 | Disposition: A | Discharge status: Home / Self Care | Payer: Medicare Other | Source: Ambulatory Visit | Attending: Pulmonary Disease | Admitting: Pulmonary Disease

## 2011-02-10 ENCOUNTER — Encounter: Payer: Self-pay | Admitting: Pulmonary Disease

## 2011-02-10 ENCOUNTER — Ambulatory Visit (INDEPENDENT_AMBULATORY_CARE_PROVIDER_SITE_OTHER): Payer: Medicare Other | Admitting: Pulmonary Disease

## 2011-02-10 DIAGNOSIS — R0602 Shortness of breath: Secondary | ICD-10-CM

## 2011-02-10 DIAGNOSIS — J9610 Chronic respiratory failure, unspecified whether with hypoxia or hypercapnia: Secondary | ICD-10-CM

## 2011-02-10 DIAGNOSIS — J449 Chronic obstructive pulmonary disease, unspecified: Secondary | ICD-10-CM

## 2011-02-10 DIAGNOSIS — J4489 Other specified chronic obstructive pulmonary disease: Secondary | ICD-10-CM

## 2011-02-10 NOTE — Patient Instructions (Signed)
Trial of symbicort 160/4.5  2 inhalations am and pm everyday.  Rinse mouth well. Can use albuterol inhaler for rescue, 2 inhalations every 6hrs if needed.  Please sit down first and rest for a few minutes, then use inhaler if still have shortness of breath. Will check cxr today, and will call you with results. Work on weight loss. If cxr is negative and back problems persists, please consult with primary md.  followup with me in 4 weeks if doing better.

## 2011-02-10 NOTE — Telephone Encounter (Signed)
Pt in car waiting. Pt want to be seen. Also wanted to change number to [phone]

## 2011-02-10 NOTE — Telephone Encounter (Signed)
[email or web address].Jennifer Castillo, CMA  

## 2011-02-10 NOTE — Telephone Encounter (Signed)
Spoke with [name].  States pt is in the car waiting.  He was at cardiac rehab this am but had to stop d/t lower back pain.  This pain started last Friday but worsened this am while in rehab.  Pt also having increased SOB, wheezing, and increased use in O2.  Denies f/c/s.  States pt was recently seen by cardiac MD and was told he needs to f/u with pulm md.  [name] is requesting OV this am regarding pt's SOB, wheezing, and back pain.  No openings available this am but [facility] does have a 1:30.  I advised wife that [facility] could see pt today at 1:30 pm for a sick visit but go to ED if sxs worsen prior to this, and we still would have to get the approval from both [facility] and [facility] regarding pt switching to [facility].  [name] verbalized understanding and ok with this plan.  Will forward message to RB - pls advise if you are ok with switch.  Thanks!

## 2011-02-10 NOTE — Assessment & Plan Note (Signed)
I think the pt does have an element of copd, but do not think it is any more than mild to moderate at the worst.  I suspect his obesity and hypoaeration are more of the issue.  He may have actual OHS.  He is on NO bronchodilators currently, and would like to get him on some kind of regimen to see if his breathing improves.

## 2011-02-10 NOTE — Telephone Encounter (Signed)
Pt's wife Bruce Taylor called again says pt is c/o pain in his lower back on the left side wants to be seen today can be reached at [phone].Raylene Everts

## 2011-02-10 NOTE — Progress Notes (Signed)
  Subjective:    Patient ID: Bruce Taylor, male    DOB: March 07, 1941, 70 y.o.   MRN: [id number]  HPI The pt comes in today for an acute sick visit.  He has multiple issues including copd, heart disease, osa with ?ohs, and morbid obesity.  He has noticed worsening doe over the last one month, but does not appear to have taken on a lot more fluid.  He has been wearing oxygen compliantly, but is not taking any of his bronchodilators.  Denies cough, chest congestion, or worsening LE edema.  He is on chronic coumadin for afib.   Review of Systems  Constitutional: Positive for unexpected weight change. Negative for fever.  HENT: Positive for ear pain, sore throat and rhinorrhea. Negative for nosebleeds, congestion, sneezing, trouble swallowing, dental problem, postnasal drip and sinus pressure.   Eyes: Negative for redness and itching.  Respiratory: Positive for chest tightness, shortness of breath and wheezing. Negative for cough.   Cardiovascular: Negative for palpitations and leg swelling.  Gastrointestinal: Negative for nausea and vomiting.  Genitourinary: Negative for dysuria.  Musculoskeletal: Negative for joint swelling.  Skin: Negative for rash.  Neurological: Positive for headaches.  Hematological: Bruises/bleeds easily.  Psychiatric/Behavioral: Negative for dysphoric mood. The patient is not nervous/anxious.        Objective:   Physical Exam Morbidly obese male in nad Nares without discharge or purulence Chest with small lung volumes, decreased depth of inspiration, but totally clear lung fields. Cor with rrr LE with 1+ edema, no cyanosis  Alert and oriented, moves all 4.        Assessment & Plan:

## 2011-02-11 NOTE — Telephone Encounter (Signed)
Change is OK with me as long as it is OK with Dr Shelle Iron.

## 2011-02-11 NOTE — Telephone Encounter (Signed)
Okay, will forward to [facility] for recs. Pls advise thanks!

## 2011-02-12 ENCOUNTER — Encounter (HOSPITAL_COMMUNITY): Payer: Medicare Other

## 2011-02-12 NOTE — Telephone Encounter (Signed)
Ok with me. He has a f/u with me already

## 2011-02-13 ENCOUNTER — Encounter: Payer: Self-pay | Admitting: Pulmonary Disease

## 2011-02-13 NOTE — Assessment & Plan Note (Signed)
This is clearly multifactorial, and due to his morbid obesity with possible OHS, deconditioning, obstructive lung disease, and underlying cardiac disease.  I have counseled him on the importance of medical compliance, and that he must lose weight if he wishes to avoid catastrophic health consequences.

## 2011-02-15 ENCOUNTER — Encounter (HOSPITAL_COMMUNITY): Payer: Medicare Other

## 2011-02-17 ENCOUNTER — Encounter (HOSPITAL_COMMUNITY): Payer: Medicare Other

## 2011-02-19 ENCOUNTER — Encounter (HOSPITAL_COMMUNITY): Payer: Medicare Other

## 2011-03-10 ENCOUNTER — Ambulatory Visit (INDEPENDENT_AMBULATORY_CARE_PROVIDER_SITE_OTHER): Payer: Medicare Other | Admitting: Pulmonary Disease

## 2011-03-10 ENCOUNTER — Encounter: Payer: Self-pay | Admitting: Pulmonary Disease

## 2011-03-10 DIAGNOSIS — J449 Chronic obstructive pulmonary disease, unspecified: Secondary | ICD-10-CM

## 2011-03-10 DIAGNOSIS — G4733 Obstructive sleep apnea (adult) (pediatric): Secondary | ICD-10-CM

## 2011-03-10 DIAGNOSIS — J9610 Chronic respiratory failure, unspecified whether with hypoxia or hypercapnia: Secondary | ICD-10-CM

## 2011-03-10 NOTE — Patient Instructions (Signed)
Continue on symbicort as directed.  Keep mouth rinsed. Work on weight reduction. followup with me in 4mos.

## 2011-03-10 NOTE — Progress Notes (Signed)
  Subjective:    Patient ID: Bruce Taylor, male    DOB: 09/25/1940, 70 y.o.   MRN: [id number]  HPI The patient comes in today for followup of his known chronic respiratory failure that is multifactorial.  The patient has morbid obesity with sleep apnea and obesity hypoventilation syndrome, moderate COPD by PFTs, and significant underlying heart disease.  At the last visit, he was having worsening shortness of breath, but he had discontinued all of his bronchodilators.  I restarted his symbicort inhaler, and today his breathing is much improved.  His wife states that she is in a big difference in his dyspnea level since being back on the medication.   Review of Systems  Constitutional: Negative for fever and unexpected weight change.  HENT: Positive for sore throat and trouble swallowing. Negative for ear pain, nosebleeds, congestion, rhinorrhea, sneezing, dental problem, postnasal drip and sinus pressure.   Eyes: Negative for redness and itching.  Respiratory: Positive for shortness of breath and wheezing. Negative for cough and chest tightness.   Cardiovascular: Positive for leg swelling. Negative for palpitations.  Gastrointestinal: Negative for nausea and vomiting.  Genitourinary: Negative for dysuria.  Musculoskeletal: Negative for joint swelling.  Skin: Negative for rash.  Neurological: Negative for headaches.  Hematological: Bruises/bleeds easily.  Psychiatric/Behavioral: Negative for dysphoric mood. The patient is not nervous/anxious.        Objective:   Physical Exam Morbidly obese male in no acute distress No skin breakdown or pressure necrosis from the CPAP mask Chest with decreased breath sounds secondary to hypoaeration, but no wheezes or rhonchi Cardiac sounds distant, but sounds regular Lower extremities with mild edema, no cyanosis noted Alert, does not appear sleepy, moves all 4 extremities.       Assessment & Plan:

## 2011-03-10 NOTE — Assessment & Plan Note (Signed)
The pt is much improved since being on symbicort, and I have asked him to continue.

## 2011-03-10 NOTE — Assessment & Plan Note (Signed)
The pt is to continue on cpap for his known osa.

## 2011-04-19 ENCOUNTER — Other Ambulatory Visit: Payer: Self-pay | Admitting: Gastroenterology

## 2011-04-20 ENCOUNTER — Ambulatory Visit
Admission: RE | Admit: 2011-04-20 | Discharge: 2011-04-20 | Disposition: A | Discharge status: Home / Self Care | Payer: Medicare Other | Source: Ambulatory Visit | Attending: Gastroenterology | Admitting: Gastroenterology

## 2011-05-14 ENCOUNTER — Other Ambulatory Visit: Payer: Self-pay | Admitting: Gastroenterology

## 2011-05-19 ENCOUNTER — Ambulatory Visit
Admission: RE | Admit: 2011-05-19 | Discharge: 2011-05-19 | Disposition: A | Discharge status: Home / Self Care | Payer: Medicare Other | Source: Ambulatory Visit | Attending: Gastroenterology | Admitting: Gastroenterology

## 2011-06-15 ENCOUNTER — Emergency Department (HOSPITAL_COMMUNITY): Payer: Medicare Other

## 2011-06-15 ENCOUNTER — Other Ambulatory Visit: Payer: Self-pay

## 2011-06-15 ENCOUNTER — Encounter (HOSPITAL_COMMUNITY): Payer: Self-pay | Admitting: Emergency Medicine

## 2011-06-15 ENCOUNTER — Inpatient Hospital Stay (HOSPITAL_COMMUNITY)
Admission: EM | Admit: 2011-06-15 | Discharge: 2011-06-17 | DRG: 251 | Disposition: A | Discharge status: Home / Self Care | Payer: Medicare Other | Attending: Cardiology | Admitting: Cardiology

## 2011-06-15 DIAGNOSIS — Z9861 Coronary angioplasty status: Secondary | ICD-10-CM

## 2011-06-15 DIAGNOSIS — Z950 Presence of cardiac pacemaker: Secondary | ICD-10-CM | POA: Diagnosis present

## 2011-06-15 DIAGNOSIS — E785 Hyperlipidemia, unspecified: Secondary | ICD-10-CM | POA: Diagnosis present

## 2011-06-15 DIAGNOSIS — I10 Essential (primary) hypertension: Secondary | ICD-10-CM | POA: Diagnosis present

## 2011-06-15 DIAGNOSIS — E119 Type 2 diabetes mellitus without complications: Secondary | ICD-10-CM | POA: Diagnosis present

## 2011-06-15 DIAGNOSIS — I4891 Unspecified atrial fibrillation: Secondary | ICD-10-CM | POA: Diagnosis present

## 2011-06-15 DIAGNOSIS — Z23 Encounter for immunization: Secondary | ICD-10-CM

## 2011-06-15 DIAGNOSIS — F17200 Nicotine dependence, unspecified, uncomplicated: Secondary | ICD-10-CM | POA: Diagnosis present

## 2011-06-15 DIAGNOSIS — I200 Unstable angina: Secondary | ICD-10-CM | POA: Diagnosis present

## 2011-06-15 DIAGNOSIS — I252 Old myocardial infarction: Secondary | ICD-10-CM

## 2011-06-15 DIAGNOSIS — E118 Type 2 diabetes mellitus with unspecified complications: Secondary | ICD-10-CM | POA: Diagnosis present

## 2011-06-15 DIAGNOSIS — Y849 Medical procedure, unspecified as the cause of abnormal reaction of the patient, or of later complication, without mention of misadventure at the time of the procedure: Secondary | ICD-10-CM | POA: Diagnosis present

## 2011-06-15 DIAGNOSIS — Z794 Long term (current) use of insulin: Secondary | ICD-10-CM

## 2011-06-15 DIAGNOSIS — I2510 Atherosclerotic heart disease of native coronary artery without angina pectoris: Principal | ICD-10-CM | POA: Diagnosis present

## 2011-06-15 DIAGNOSIS — T82897A Other specified complication of cardiac prosthetic devices, implants and grafts, initial encounter: Secondary | ICD-10-CM | POA: Diagnosis present

## 2011-06-15 DIAGNOSIS — Z7982 Long term (current) use of aspirin: Secondary | ICD-10-CM

## 2011-06-15 DIAGNOSIS — I482 Chronic atrial fibrillation, unspecified: Secondary | ICD-10-CM | POA: Diagnosis present

## 2011-06-15 DIAGNOSIS — G4733 Obstructive sleep apnea (adult) (pediatric): Secondary | ICD-10-CM | POA: Diagnosis present

## 2011-06-15 HISTORY — DX: Anemia, unspecified: D64.9

## 2011-06-15 HISTORY — DX: Reserved for inherently not codable concepts without codable children: IMO0001

## 2011-06-15 HISTORY — DX: Adverse effect of unspecified anesthetic, initial encounter: T41.45XA

## 2011-06-15 HISTORY — DX: Tremor, unspecified: R25.1

## 2011-06-15 HISTORY — DX: Other complications of anesthesia, initial encounter: T88.59XA

## 2011-06-15 HISTORY — DX: Atherosclerotic heart disease of native coronary artery without angina pectoris: I25.10

## 2011-06-15 HISTORY — DX: Obstructive sleep apnea (adult) (pediatric): G47.33

## 2011-06-15 HISTORY — DX: Encounter for other specified aftercare: Z51.89

## 2011-06-15 HISTORY — DX: Disorder of kidney and ureter, unspecified: N28.9

## 2011-06-15 HISTORY — DX: Shortness of breath: R06.02

## 2011-06-15 HISTORY — DX: Dependence on other enabling machines and devices: Z99.89

## 2011-06-15 HISTORY — DX: Morbid (severe) obesity due to excess calories: E66.01

## 2011-06-15 HISTORY — DX: Dependence on supplemental oxygen: Z99.81

## 2011-06-15 HISTORY — DX: Reserved for concepts with insufficient information to code with codable children: IMO0002

## 2011-06-15 HISTORY — DX: Chronic obstructive pulmonary disease, unspecified: J44.9

## 2011-06-15 LAB — URINALYSIS, ROUTINE W REFLEX MICROSCOPIC
Bilirubin Urine: NEGATIVE
Leukocytes, UA: NEGATIVE
Nitrite: NEGATIVE
Specific Gravity, Urine: 1.009 (ref 1.005–1.030)
Urobilinogen, UA: 0.2 mg/dL (ref 0.0–1.0)

## 2011-06-15 LAB — DIFFERENTIAL
Eosinophils Absolute: 0.1 10*3/uL (ref 0.0–0.7)
Lymphs Abs: 1.6 10*3/uL (ref 0.7–4.0)
Monocytes Absolute: 0.8 10*3/uL (ref 0.1–1.0)
Monocytes Relative: 12 % (ref 3–12)
Neutrophils Relative %: 63 % (ref 43–77)

## 2011-06-15 LAB — GLUCOSE, CAPILLARY: Glucose-Capillary: 173 mg/dL — ABNORMAL HIGH (ref 70–99)

## 2011-06-15 LAB — CARDIAC PANEL(CRET KIN+CKTOT+MB+TROPI)
Relative Index: 1.9 (ref 0.0–2.5)
Troponin I: <0.3 ng/mL (ref <0.30)

## 2011-06-15 LAB — BASIC METABOLIC PANEL
CO2: 27 mEq/L (ref 19–32)
Chloride: 99 mEq/L (ref 96–112)
GFR calc non Af Amer: 42 mL/min — ABNORMAL LOW (ref >90)
Glucose, Bld: 94 mg/dL (ref 70–99)
Potassium: 5 mEq/L (ref 3.5–5.1)
Sodium: 134 mEq/L — ABNORMAL LOW (ref 135–145)

## 2011-06-15 LAB — CBC
HCT: 36.6 % — ABNORMAL LOW (ref 39.0–52.0)
Hemoglobin: 11.8 g/dL — ABNORMAL LOW (ref 13.0–17.0)
MCH: 28.5 pg (ref 26.0–34.0)
MCV: 88.4 fL (ref 78.0–100.0)
RBC: 4.14 MIL/uL — ABNORMAL LOW (ref 4.22–5.81)

## 2011-06-15 LAB — APTT: aPTT: 41 seconds — ABNORMAL HIGH (ref 24–37)

## 2011-06-15 LAB — PROTIME-INR: Prothrombin Time: 16.6 seconds — ABNORMAL HIGH (ref 11.6–15.2)

## 2011-06-15 LAB — MAGNESIUM: Magnesium: 2.1 mg/dL (ref 1.5–2.5)

## 2011-06-15 LAB — POCT I-STAT TROPONIN I

## 2011-06-15 LAB — HEMOGLOBIN A1C: Mean Plasma Glucose: 148 mg/dL — ABNORMAL HIGH (ref <117)

## 2011-06-15 MED ORDER — SODIUM CHLORIDE 0.9 % IV SOLN
INTRAVENOUS | Status: DC
  Administered 2011-06-15: 13:00:00 via INTRAVENOUS

## 2011-06-15 MED ORDER — FUROSEMIDE 40 MG PO TABS
40.0000 mg | ORAL_TABLET | Freq: Every day | ORAL | Status: DC
  Administered 2011-06-17: 40 mg via ORAL
  Filled 2011-06-15 (×2): qty 1

## 2011-06-15 MED ORDER — ONDANSETRON HCL 4 MG/2ML IJ SOLN: 4.0000 mg | Freq: Four times a day (QID) | INTRAMUSCULAR | Status: DC | PRN

## 2011-06-15 MED ORDER — NITROGLYCERIN IN D5W 200-5 MCG/ML-% IV SOLN
3.0000 ug/min | INTRAVENOUS | Status: DC
  Administered 2011-06-15: 3 ug/min via INTRAVENOUS

## 2011-06-15 MED ORDER — SODIUM CHLORIDE 0.9 % IV SOLN
1.0000 mL/kg/h | INTRAVENOUS | Status: DC
  Administered 2011-06-16 (×2): 1 mL/kg/h via INTRAVENOUS

## 2011-06-15 MED ORDER — ALBUTEROL SULFATE HFA 108 (90 BASE) MCG/ACT IN AERS
1.0000 | INHALATION_SPRAY | Freq: Four times a day (QID) | RESPIRATORY_TRACT | Status: DC | PRN
  Administered 2011-06-17: 2 via RESPIRATORY_TRACT
  Filled 2011-06-15: qty 6.7

## 2011-06-15 MED ORDER — INSULIN ASPART 100 UNIT/ML ~~LOC~~ SOLN: 0.0000 [IU] | SUBCUTANEOUS | Status: DC

## 2011-06-15 MED ORDER — ZOLPIDEM TARTRATE 5 MG PO TABS: 5.0000 mg | ORAL_TABLET | Freq: Every evening | ORAL | Status: DC | PRN

## 2011-06-15 MED ORDER — ASPIRIN 81 MG PO CHEW
324.0000 mg | CHEWABLE_TABLET | Freq: Once | ORAL | Status: AC
  Administered 2011-06-15: 324 mg via ORAL
  Filled 2011-06-15: qty 4

## 2011-06-15 MED ORDER — SODIUM CHLORIDE 0.9 % IV SOLN: Freq: Once | INTRAVENOUS | Status: DC

## 2011-06-15 MED ORDER — ZOLPIDEM TARTRATE 5 MG PO TABS: 10.0000 mg | ORAL_TABLET | Freq: Every evening | ORAL | Status: DC | PRN

## 2011-06-15 MED ORDER — DIAZEPAM 5 MG PO TABS
5.0000 mg | ORAL_TABLET | ORAL | Status: AC
  Administered 2011-06-16: 5 mg via ORAL
  Filled 2011-06-15: qty 1

## 2011-06-15 MED ORDER — HEPARIN BOLUS VIA INFUSION
4000.0000 [IU] | Freq: Once | INTRAVENOUS | Status: AC
  Administered 2011-06-15: 4000 [IU] via INTRAVENOUS
  Filled 2011-06-15: qty 4000

## 2011-06-15 MED ORDER — OXYBUTYNIN CHLORIDE 5 MG PO TABS
5.0000 mg | ORAL_TABLET | Freq: Two times a day (BID) | ORAL | Status: DC
  Administered 2011-06-15 – 2011-06-17 (×4): 5 mg via ORAL
  Filled 2011-06-15 (×5): qty 1

## 2011-06-15 MED ORDER — PANTOPRAZOLE SODIUM 40 MG PO TBEC
40.0000 mg | DELAYED_RELEASE_TABLET | Freq: Every day | ORAL | Status: DC
  Administered 2011-06-16 – 2011-06-17 (×2): 40 mg via ORAL
  Filled 2011-06-15 (×2): qty 1

## 2011-06-15 MED ORDER — PRIMIDONE 50 MG PO TABS
100.0000 mg | ORAL_TABLET | Freq: Every day | ORAL | Status: DC
  Administered 2011-06-15 – 2011-06-16 (×2): 100 mg via ORAL
  Filled 2011-06-15 (×3): qty 2

## 2011-06-15 MED ORDER — CARVEDILOL 6.25 MG PO TABS
6.2500 mg | ORAL_TABLET | Freq: Two times a day (BID) | ORAL | Status: DC
  Administered 2011-06-16 – 2011-06-17 (×3): 6.25 mg via ORAL
  Filled 2011-06-15 (×5): qty 1

## 2011-06-15 MED ORDER — LISINOPRIL 10 MG PO TABS
10.0000 mg | ORAL_TABLET | Freq: Every day | ORAL | Status: DC
  Administered 2011-06-16 – 2011-06-17 (×2): 10 mg via ORAL
  Filled 2011-06-15 (×2): qty 1

## 2011-06-15 MED ORDER — SODIUM CHLORIDE 0.9 % IV SOLN: INTRAVENOUS | Status: DC

## 2011-06-15 MED ORDER — ASPIRIN 81 MG PO CHEW
324.0000 mg | CHEWABLE_TABLET | ORAL | Status: AC
  Administered 2011-06-16: 324 mg via ORAL
  Filled 2011-06-15: qty 4

## 2011-06-15 MED ORDER — ALPRAZOLAM 0.25 MG PO TABS: 0.2500 mg | ORAL_TABLET | Freq: Two times a day (BID) | ORAL | Status: DC | PRN

## 2011-06-15 MED ORDER — NITROGLYCERIN IN D5W 200-5 MCG/ML-% IV SOLN
INTRAVENOUS | Status: AC
  Filled 2011-06-15: qty 250

## 2011-06-15 MED ORDER — INSULIN ASPART PROT & ASPART (70-30 MIX) 100 UNIT/ML ~~LOC~~ SUSP
40.0000 [IU] | Freq: Two times a day (BID) | SUBCUTANEOUS | Status: DC
  Administered 2011-06-17: 40 [IU] via SUBCUTANEOUS
  Filled 2011-06-15: qty 3

## 2011-06-15 MED ORDER — MORPHINE SULFATE 2 MG/ML IJ SOLN
2.0000 mg | INTRAMUSCULAR | Status: DC | PRN
  Administered 2011-06-15: 2 mg via INTRAVENOUS
  Filled 2011-06-15: qty 1

## 2011-06-15 MED ORDER — PRIMIDONE 50 MG PO TABS
50.0000 mg | ORAL_TABLET | Freq: Every day | ORAL | Status: DC
  Administered 2011-06-16 – 2011-06-17 (×2): 50 mg via ORAL
  Filled 2011-06-15 (×2): qty 1

## 2011-06-15 MED ORDER — ACETAMINOPHEN 325 MG PO TABS: 650.0000 mg | ORAL_TABLET | ORAL | Status: DC | PRN

## 2011-06-15 MED ORDER — NITROGLYCERIN 0.4 MG SL SUBL: 0.4000 mg | SUBLINGUAL_TABLET | SUBLINGUAL | Status: DC | PRN

## 2011-06-15 MED ORDER — PRIMIDONE 50 MG PO TABS
50.0000 mg | ORAL_TABLET | Freq: Two times a day (BID) | ORAL | Status: DC
  Filled 2011-06-15: qty 2

## 2011-06-15 MED ORDER — INSULIN ASPART 100 UNIT/ML ~~LOC~~ SOLN
0.0000 [IU] | Freq: Three times a day (TID) | SUBCUTANEOUS | Status: DC
  Administered 2011-06-16: 2 [IU] via SUBCUTANEOUS
  Administered 2011-06-16: 3 [IU] via SUBCUTANEOUS
  Administered 2011-06-17: 2 [IU] via SUBCUTANEOUS
  Filled 2011-06-15 (×10): qty 3

## 2011-06-15 MED ORDER — ASPIRIN EC 81 MG PO TBEC
81.0000 mg | DELAYED_RELEASE_TABLET | Freq: Every day | ORAL | Status: DC
  Filled 2011-06-15: qty 1

## 2011-06-15 MED ORDER — HEPARIN (PORCINE) IN NACL 100-0.45 UNIT/ML-% IJ SOLN
1700.0000 [IU]/h | INTRAMUSCULAR | Status: DC
  Administered 2011-06-15: 1500 [IU]/h via INTRAVENOUS
  Administered 2011-06-16: 1700 [IU]/h via INTRAVENOUS
  Filled 2011-06-15 (×3): qty 250

## 2011-06-15 MED ORDER — NITROGLYCERIN 0.4 MG SL SUBL
0.4000 mg | SUBLINGUAL_TABLET | SUBLINGUAL | Status: DC | PRN
  Administered 2011-06-15 (×2): 0.4 mg via SUBLINGUAL
  Filled 2011-06-15: qty 75

## 2011-06-15 MED ORDER — BUDESONIDE-FORMOTEROL FUMARATE 160-4.5 MCG/ACT IN AERO
2.0000 | INHALATION_SPRAY | Freq: Two times a day (BID) | RESPIRATORY_TRACT | Status: DC
  Administered 2011-06-16 – 2011-06-17 (×3): 2 via RESPIRATORY_TRACT
  Filled 2011-06-15: qty 6

## 2011-06-15 MED ORDER — INFLUENZA VIRUS VACC SPLIT PF IM SUSP
0.5000 mL | INTRAMUSCULAR | Status: AC
  Administered 2011-06-17: 0.5 mL via INTRAMUSCULAR
  Filled 2011-06-15 (×2): qty 0.5

## 2011-06-15 MED ORDER — SPIRONOLACTONE 25 MG PO TABS
25.0000 mg | ORAL_TABLET | Freq: Every day | ORAL | Status: DC
  Administered 2011-06-17: 25 mg via ORAL
  Filled 2011-06-15 (×2): qty 1

## 2011-06-15 MED ORDER — CITALOPRAM HYDROBROMIDE 40 MG PO TABS
40.0000 mg | ORAL_TABLET | Freq: Every day | ORAL | Status: DC
  Administered 2011-06-16 – 2011-06-17 (×2): 40 mg via ORAL
  Filled 2011-06-15 (×2): qty 1

## 2011-06-15 NOTE — ED Notes (Signed)
Cardialogy at bedside

## 2011-06-15 NOTE — ED Notes (Signed)
First meeting with patient. Patient states he has left and right side chest pain with more pain/heaviness on left side of chest. Patient denies n/v and no diaphresis. Patient states and is having labored breathing.  Patient startes all symptoms started at 11:15 am this morning while driving his car to a doctors appointment for lab work. Patient states more heaviness than pain.

## 2011-06-15 NOTE — ED Notes (Signed)
Pt states pain 4/10 after nitro. Remains on monitor. States he is comfortable. Pt is breathing easier and appears more at ease.

## 2011-06-15 NOTE — ED Notes (Addendum)
Pt remains pain free. Remains on monitor. Heparin has been dosed but not brought up from pharmacy. Pt talking on phone with family. Cardiology MD here.

## 2011-06-15 NOTE — ED Provider Notes (Signed)
History     CSN: [id number] Arrival date & time: 06/15/2011 11:54 AM      Chief Complaint  Patient presents with  . Chest Pain   HPI Pt was seen at 1225.  Per pt and spouse, c/o gradual onset and persistence of constant mid-sternal chest "pain" that began while he was sitting in a car PTA.  Describes the CP as "heavy" and "pressure," as well as "worse than my usual angina."  Has been assoc with SOB.  Pt states his symptoms worsened with exertion (walking).  Denies palpitations, no cough, no back pain, no abd pain, no N/V/D.     Cards:  Dr. Clarene [facility]) Past Medical History  Diagnosis Date  . Hyperlipidemia   . Hypertension   . Myocardial infarct   . OSA (obstructive sleep apnea)   . Diabetes mellitus   . Complete heart block     s/p PPM  . Atrial fibrillation     on chronic coumadin  . Ischemic cardiomyopathy     EF 45% by echo 2010  . COPD (chronic obstructive pulmonary disease)   . On home O2   . Renal insufficiency   . Tremor   . Angina pectoris   . Obstructive sleep apnea on CPAP     Past Surgical History  Procedure Date  . Coronary stent placement     x 8  . Back surgery     x3  . Pacemaker insertion     Family History  Problem Relation Age of Onset  . Heart disease Father   . Heart disease Brother   . Uterine cancer Mother   . Diabetes Sister     History  Substance Use Topics  . Smoking status: Former Smoker -- 2.0 packs/day for 52 years    Types: Cigarettes    Quit date: 07/26/2006  . Smokeless tobacco: Not on file  . Alcohol Use: Yes     Review of Systems ROS: Statement: All systems negative except as marked or noted in the HPI; Constitutional: Negative for fever and chills. ; ; Eyes: Negative for eye pain, redness and discharge. ; ; ENMT: Negative for ear pain, hoarseness, nasal congestion, sinus pressure and sore throat. ; ; Cardiovascular:   +CP, SOB. Negative for palpitations, diaphoresis, and peripheral edema. ; ; Respiratory: Negative  for cough, wheezing and stridor. ; ; Gastrointestinal: Negative for nausea, vomiting, diarrhea and abdominal pain, blood in stool, hematemesis, jaundice and rectal bleeding. . ; ; Genitourinary: Negative for dysuria, flank pain and hematuria. ; ; Musculoskeletal: Negative for back pain and neck pain. Negative for swelling and trauma.; ; Skin: Negative for pruritus, rash, abrasions, blisters, bruising and skin lesion.; ; Neuro: Negative for headache, lightheadedness and neck stiffness. Negative for weakness, altered level of consciousness , altered mental status, extremity weakness, paresthesias, involuntary movement, seizure and syncope.     Allergies  Penicillins  Home Medications   Current Outpatient Rx  Name Route Sig Dispense Refill  . ALBUTEROL SULFATE HFA 108 (90 BASE) MCG/ACT IN AERS Inhalation Inhale 2 puffs into the lungs every 6 (six) hours as needed.      . BUDESONIDE-FORMOTEROL FUMARATE 160-4.5 MCG/ACT IN AERO Inhalation Inhale 2 puffs into the lungs 2 (two) times daily.      Marland Kitchen CARVEDILOL 6.25 MG PO TABS  Take 1 1/2 tabs each morning and 1 tab each evening     . CITALOPRAM HYDROBROMIDE 40 MG PO TABS Oral Take 40 mg by mouth daily.      Marland Kitchen  FUROSEMIDE 40 MG PO TABS Oral Take 40 mg by mouth daily.      . INSULIN ASPART PROT & ASPART (70-30) 100 UNIT/ML [place] SUSP Subcutaneous Inject into the skin as directed.      . ISOSORBIDE MONONITRATE ER 30 MG PO TB24 Oral Take 30 mg by mouth daily.      Marland Kitchen LISINOPRIL 20 MG PO TABS Oral Take 10 mg by mouth daily.      . OXYBUTYNIN CHLORIDE 5 MG PO TABS Oral Take 5 mg by mouth 2 (two) times daily.      Marland Kitchen PANTOPRAZOLE SODIUM 40 MG PO TBEC Oral Take 40 mg by mouth daily.      Marland Kitchen SPIRONOLACTONE 25 MG PO TABS Oral Take 25 mg by mouth daily.      . WARFARIN SODIUM 10 MG PO TABS Oral Take 10 mg by mouth as directed.        BP 131/63  Pulse 67  Temp(Src) 98.5 F (36.9 C) (Oral)  Resp 22  SpO2 95%  Physical Exam 1230: Physical examination:  Nursing  notes reviewed; Vital signs and O2 SAT reviewed;  Constitutional: Well developed, Well nourished, Well hydrated, In no acute distress; Head:  Normocephalic, atraumatic; Eyes: EOMI, PERRL, No scleral icterus; ENMT: Mouth and pharynx normal, Mucous membranes moist; Neck: Supple, Full range of motion, No lymphadenopathy; Cardiovascular: Regular rate and rhythm, No murmur, rub, or gallop; Respiratory: Breath sounds coarse & equal bilaterally, No wheezes, Normal respiratory effort/excursion; Chest: Nontender, Movement normal; Abdomen: Soft, Nontender, Nondistended, Normal bowel sounds;  Extremities: Pulses normal, No tenderness, 2+ pedal edema bilat, No calf asymmetry.; Neuro: AA&Ox3, Major CN grossly intact. No facial droop, speech clear. No gross focal motor or sensory deficits in extremities.; Skin: Color normal, Warm, Dry.    ED Course  Procedures   MDM  MDM Reviewed: nursing note and vitals Reviewed previous: ECG Interpretation: labs, ECG and x-ray    Date: 06/15/2011  Rate: 70  Rhythm: Ventricular paced  QRS Axis: left  Intervals: normal  ST/T Wave abnormalities: normal  Conduction Disutrbances:left bundle branch block  Narrative Interpretation:   Old EKG Reviewed: unchanged; no significant changes from previous EKG dated 10/28/2010.  Results for orders placed during the hospital encounter of 06/15/11  PRO B NATRIURETIC PEPTIDE      Component Value Range   BNP, POC 477.5 (*) 0 - 125 (pg/mL)  BASIC METABOLIC PANEL      Component Value Range   Sodium 134 (*) 135 - 145 (mEq/L)   Potassium 5.0  3.5 - 5.1 (mEq/L)   Chloride 99  96 - 112 (mEq/L)   CO2 27  19 - 32 (mEq/L)   Glucose, Bld 94  70 - 99 (mg/dL)   BUN 28 (*) 6 - 23 (mg/dL)   Creatinine, Ser 1.61 (*) 0.50 - 1.35 (mg/dL)   Calcium 9.2  8.4 - 09.6 (mg/dL)   GFR calc non Af Amer 42 (*) >90 (mL/min)   GFR calc Af Amer 49 (*) >90 (mL/min)  CBC      Component Value Range   WBC 6.8  4.0 - 10.5 (K/uL)   RBC 4.14 (*) 4.22 - 5.81  (MIL/uL)   Hemoglobin 11.8 (*) 13.0 - 17.0 (g/dL)   HCT 04.5 (*) 40.9 - 52.0 (%)   MCV 88.4  78.0 - 100.0 (fL)   MCH 28.5  26.0 - 34.0 (pg)   MCHC 32.2  30.0 - 36.0 (g/dL)   RDW 81.1 (*) 91.4 - 15.5 (%)  Platelets 205  150 - 400 (K/uL)  DIFFERENTIAL      Component Value Range   Neutrophils Relative 63  43 - 77 (%)   Neutro Abs 4.3  1.7 - 7.7 (K/uL)   Lymphocytes Relative 23  12 - 46 (%)   Lymphs Abs 1.6  0.7 - 4.0 (K/uL)   Monocytes Relative 12  3 - 12 (%)   Monocytes Absolute 0.8  0.1 - 1.0 (K/uL)   Eosinophils Relative 1  0 - 5 (%)   Eosinophils Absolute 0.1  0.0 - 0.7 (K/uL)   Basophils Relative 0  0 - 1 (%)   Basophils Absolute 0.0  0.0 - 0.1 (K/uL)  POCT I-STAT TROPONIN I      Component Value Range   Troponin i, poc 0.00  0.00 - 0.08 (ng/mL)   Comment 3             Results for ALESSANDRO, GRIEP (MRN [id number]) as of 06/15/2011 20:55  Ref. Range 01/01/2011 12:30 06/15/2011 12:29  BUN Latest Range: 6-23 mg/dL 29 (H) 28 (H)  Creat Latest Range: 0.50-1.35 mg/dL 4.09 (H) 8.11 (H)    Dg Chest Port 1 View  06/15/2011  *RADIOLOGY REPORT*  Clinical Data: Short of breath.  Rule out CHF.  PORTABLE CHEST - 1 VIEW  Comparison: 02/10/2011  Findings: Mildly degraded exam due to AP portable technique and patient body habitus.  Single lead pacer with leads right ventricle.  Cardiomegaly accentuated by AP portable technique.  No right pleural effusion.  Limited evaluation of the left pleural space due to overlying soft tissues. No pneumothorax.  Mild pulmonary venous congestion, accentuated by AP portable technique.  Right lung is clear.  Degraded evaluation of the left lung base.  IMPRESSION:  1.  Cardiomegaly and low lung volumes with mild pulmonary venous congestion. 2.  Limited evaluation of the left pleural space and left lung base.  Original Report Authenticated By: Consuello Bossier, M.D.    2:02 PM:  Feeling "much better" after ntg SL, ASA.  SBP dropped to 80's and 90's during ntg  administration, increased to 110's with small IVF bolus.  Cr elevated per baseline.  Dx testing d/w pt and family.  Questions answered.  Verb understanding, agreeable to admit.  T/C to [facility] [place], case discussed, including:  HPI, pertinent PM/SHx, VS/PE, dx testing, ED course and treatment.  Agreeable to admit.  Will come to ED for eval.       [facility]      Laray Anger, DO 06/15/11 2057

## 2011-06-15 NOTE — ED Notes (Signed)
Increasse nitro per orders from [place], NP to 10 mcg. Patient pain is 6 intermit.

## 2011-06-15 NOTE — ED Notes (Signed)
Dr. McManus at bedside. 

## 2011-06-15 NOTE — Progress Notes (Signed)
ANTICOAGULATION CONSULT NOTE - Initial Consult  Pharmacy Consult for Heparin Indication: Unstable angina  Allergies  Allergen Reactions  . Penicillins Swelling    Patient Measurements: Height: 5' 11.5" (181.6 cm) Weight: 311 lb (141.069 kg) IBW/kg (Calculated) : 76.45  Heparin dosing weight: 109 kg  Vital Signs: Temp: 98.5 F (36.9 C) (11/20 1207) Temp src: Oral (11/20 1207) BP: 110/53 mmHg (11/20 1630) Pulse Rate: 57  (11/20 1500)  Labs:  Basename 06/15/11 1618 06/15/11 1615 06/15/11 1229  HGB -- -- 11.8*  HCT -- -- 36.6*  PLT -- -- 205  APTT -- 41* --  LABPROT -- 16.6* --  INR -- 1.32 --  HEPARINUNFRC -- -- --  CREATININE -- -- 1.58*  CKTOTAL 129 -- --  CKMB 2.5 -- --  TROPONINI <0.30 -- --   Estimated Creatinine Clearance: 61.1 ml/min (by C-G formula based on Cr of 1.58).  Medical History: Past Medical History  Diagnosis Date  . Hyperlipidemia   . Hypertension   . Myocardial infarct   . OSA (obstructive sleep apnea)   . Diabetes mellitus   . Complete heart block     s/p PPM  . Atrial fibrillation     on chronic coumadin  . Ischemic cardiomyopathy     EF 45% by echo 2010  . COPD (chronic obstructive pulmonary disease)   . On home O2   . Renal insufficiency   . Tremor   . Angina pectoris   . Obstructive sleep apnea on CPAP   . Obesity, morbid 06/15/2011  . Coronary artery disease   . Pacemaker 06/15/2011  . Shortness of breath     Assessment: 70yo morbidly obese male with significant cardiac history to begin heparin for unstable angina. On coumadin PTA for afib but INR is 1.32 and coumadin on hold for possible cath. Wt=141 kg and CrCl=20ml/min.  Goal of Therapy:  Heparin level 0.3-0.7 units/ml   Plan:  1) Heparin bolus 4000 units x 1 2) Heparin drip at 1500 units/hr 3) 8 hour heparin level 4) Daily heparin level and CBC  Fredrik Rigger 06/15/2011,6:27 PM

## 2011-06-15 NOTE — ED Notes (Signed)
Chest pain started 45 mins ago w/ sob  On home 02 at 2 l [place]

## 2011-06-15 NOTE — H&P (Signed)
Bruce Taylor is an 70 y.o. male.   Chief Complaint: chest pain HPI: 70 year old white married male patient of Dr. Clarene Duke presents to the emergency room here [facility] today chest pain. Patient has a history of coronary disease with prior stents to the right coronary artery, and the LAD, and the left circumflex, been doing quite well without any angina and has chronic shortness of breath was stable. Today he was going to his doctor's appointment with Dr. Sharl Ma and while sitting developed chest pain described as very heavy. Initially pain was nine-on 1-10 scale he had no associated  Symptoms nausea diaphoresis or any increase in his chronic shortness of breath. Patient was wearing his chronic oxygen at 2 L at the time. Because of the severity of having and he came to [facility] emergency room and was given nitroglycerin sublingual with improvement but the pain would return he was also given 4 baby aspirin. At the time of this exam he was being given 2 mg of IV morphine.  With the initial sublingual nitroglycerin blood pressure dropped to 56 systolic.  On my exam blood pressure is up to 111 systolic. Pain continues to be mildly present we will begin IV nitroglycerin. Patient is on Coumadin for his chronic atrial fibrillation we will only begin heparin when INR less than 2.0.  Patient was recently seen by Dr. Jaye Beagle flow at that visit he was to stop his effient at the end of October.   Patient's other history does include obstructive sleep apnea with CPAP, COPD with continuous oxygen use. He has LV dysfunction with EF 45% in 2010 which is the last EF I have for the gentleman.  Past Medical History  Diagnosis Date  . Hyperlipidemia   . Hypertension   . Myocardial infarct   . OSA (obstructive sleep apnea)   . Diabetes mellitus   . Complete heart block     s/p PPM  . Atrial fibrillation     on chronic coumadin  . Ischemic cardiomyopathy     EF 45% by echo 2010  . COPD (chronic  obstructive pulmonary disease)   . On home O2   . Renal insufficiency   . Tremor   . Angina pectoris   . Obstructive sleep apnea on CPAP   . Obesity, morbid 06/15/2011  . Coronary artery disease   . Pacemaker 06/15/2011  . Shortness of breath     Past Surgical History  Procedure Date  . Coronary stent placement     x 8  . Back surgery     x3  . Pacemaker insertion   . Coronary angioplasty   . Coronary stent placement 4/12    Has stents in LAD, RCA,LCX    Family History  Problem Relation Age of Onset  . Heart disease Father   . Heart disease Brother   . Uterine cancer Mother   . Diabetes Sister    Social History:  reports that he quit smoking about 4 years ago. His smoking use included Cigarettes. He has a 104 pack-year smoking history. He does not have any smokeless tobacco history on file. He reports that he does not drink alcohol or use illicit drugs.  Allergies:  Allergies  Allergen Reactions  . Penicillins Swelling    Medications Prior to Admission  Medication Dose Route Frequency Provider Last Rate Last Dose  . 0.9 %  sodium chloride infusion   Intravenous Continuous Laray Anger, DO      . 0.9 %  sodium chloride infusion   Intravenous Once Leone Brand, NP 75 mL/hr at 06/15/11 1548    . aspirin chewable tablet 324 mg  324 mg Oral Once Laray Anger, DO   324 mg at 06/15/11 1242  . morphine 2 MG/ML injection 2 mg  2 mg Intravenous PRN Laray Anger, DO   2 mg at 06/15/11 1503  . nitroGLYCERIN (NITROSTAT) SL tablet 0.4 mg  0.4 mg Sublingual Q5 min PRN Laray Anger, DO   0.4 mg at 06/15/11 1418  . nitroGLYCERIN 0.2 mg/mL in dextrose 5 % infusion  3 mcg/min Intravenous Titrated Leone Brand, NP 0.9 mL/hr at 06/15/11 1546 3 mcg/min at 06/15/11 1546  . nitroGLYCERIN 0.2 mg/mL in dextrose 5 % infusion  3 mcg/min Intravenous Titrated Leone Brand, NP       Medications Prior to Admission  Medication Sig Dispense Refill  . albuterol (PROAIR  HFA) 108 (90 BASE) MCG/ACT inhaler Inhale 1-2 puffs into the lungs every 6 (six) hours as needed. For shortness of breath      . budesonide-formoterol (SYMBICORT) 160-4.5 MCG/ACT inhaler Inhale 2 puffs into the lungs 2 (two) times daily.       . carvedilol (COREG) 6.25 MG tablet Take by mouth 2 (two) times daily with a meal. Take 1 1/2 tabs each morning and 1 tab each evening      . citalopram (CELEXA) 40 MG tablet Take 40 mg by mouth daily.        . furosemide (LASIX) 40 MG tablet Take 40 mg by mouth daily.        . insulin aspart protamine-insulin aspart (NOVOLOG 70/30) (70-30) 100 UNIT/ML injection Inject 30-45 Units into the skin 2 (two) times daily with a meal.       . isosorbide mononitrate (IMDUR) 30 MG 24 hr tablet Take 30 mg by mouth daily.        Marland Kitchen lisinopril (PRINIVIL,ZESTRIL) 20 MG tablet Take 10 mg by mouth daily.        Marland Kitchen oxybutynin (DITROPAN) 5 MG tablet Take 5 mg by mouth 2 (two) times daily.        . pantoprazole (PROTONIX) 40 MG tablet Take 40 mg by mouth daily.        Marland Kitchen spironolactone (ALDACTONE) 25 MG tablet Take 25 mg by mouth daily.          Results for orders placed during the hospital encounter of 06/15/11 (from the past 48 hour(s))  BASIC METABOLIC PANEL     Status: Abnormal   Collection Time   06/15/11 12:29 PM      Component Value Range Comment   Sodium 134 (*) 135 - 145 (mEq/L)    Potassium 5.0  3.5 - 5.1 (mEq/L)    Chloride 99  96 - 112 (mEq/L)    CO2 27  19 - 32 (mEq/L)    Glucose, Bld 94  70 - 99 (mg/dL)    BUN 28 (*) 6 - 23 (mg/dL)    Creatinine, Ser 1.61 (*) 0.50 - 1.35 (mg/dL)    Calcium 9.2  8.4 - 10.5 (mg/dL)    GFR calc non Af Amer 42 (*) >90 (mL/min)    GFR calc Af Amer 49 (*) >90 (mL/min)   CBC     Status: Abnormal   Collection Time   06/15/11 12:29 PM      Component Value Range Comment   WBC 6.8  4.0 - 10.5 (K/uL)    RBC 4.14 (*)  4.22 - 5.81 (MIL/uL)    Hemoglobin 11.8 (*) 13.0 - 17.0 (g/dL)    HCT 04.5 (*) 40.9 - 52.0 (%)    MCV 88.4  78.0  - 100.0 (fL)    MCH 28.5  26.0 - 34.0 (pg)    MCHC 32.2  30.0 - 36.0 (g/dL)    RDW 81.1 (*) 91.4 - 15.5 (%)    Platelets 205  150 - 400 (K/uL)   DIFFERENTIAL     Status: Normal   Collection Time   06/15/11 12:29 PM      Component Value Range Comment   Neutrophils Relative 63  43 - 77 (%)    Neutro Abs 4.3  1.7 - 7.7 (K/uL)    Lymphocytes Relative 23  12 - 46 (%)    Lymphs Abs 1.6  0.7 - 4.0 (K/uL)    Monocytes Relative 12  3 - 12 (%)    Monocytes Absolute 0.8  0.1 - 1.0 (K/uL)    Eosinophils Relative 1  0 - 5 (%)    Eosinophils Absolute 0.1  0.0 - 0.7 (K/uL)    Basophils Relative 0  0 - 1 (%)    Basophils Absolute 0.0  0.0 - 0.1 (K/uL)   PRO B NATRIURETIC PEPTIDE     Status: Abnormal   Collection Time   06/15/11 12:33 PM      Component Value Range Comment   BNP, POC 477.5 (*) 0 - 125 (pg/mL)   POCT I-STAT TROPONIN I     Status: Normal   Collection Time   06/15/11 12:59 PM      Component Value Range Comment   Troponin i, poc 0.00  0.00 - 0.08 (ng/mL)    Comment 3             Dg Chest Port 1 View  06/15/2011  *RADIOLOGY REPORT*  Clinical Data: Short of breath.  Rule out CHF.  PORTABLE CHEST - 1 VIEW  Comparison: 02/10/2011  Findings: Mildly degraded exam due to AP portable technique and patient body habitus.  Single lead pacer with leads right ventricle.  Cardiomegaly accentuated by AP portable technique.  No right pleural effusion.  Limited evaluation of the left pleural space due to overlying soft tissues. No pneumothorax.  Mild pulmonary venous congestion, accentuated by AP portable technique.  Right lung is clear.  Degraded evaluation of the left lung base.  IMPRESSION:  1.  Cardiomegaly and low lung volumes with mild pulmonary venous congestion. 2.  Limited evaluation of the left pleural space and left lung base.  Original Report Authenticated By: Consuello Bossier, M.D.    ROS: general: No colds or fevers. Skin: Denies rashes or ulcers. HEENT: No blurred vision double  vision. Cardiovascular: Had no angina until today. Pulmonary: No change in his chronic shortness of breath continues with home oxygen. GI: denies melena, denies constipation, denies indigestion. GU: 5 hematuria or dysuria. Does have frequent urination. Musculoskeletal: Denies any current problems. Endocrine: Glucoses been stable for the patient with Accu-Cheks 121-142 in the mornings Neuro: No lightheadedness, dizziness or syncope.  Blood pressure 101/48, pulse 57, temperature 98.5 F (36.9 C), temperature source Oral, resp. rate 22, SpO2 96.00%. PE:  General: Alert oriented white male obviously uncomfortable on initial exam, Though pleasant affect. Skin: Warm and dry, brisk capillary refill. No ulcers noted. HEENT: Normocephalic, sclera clear. Neck: Thick neck do not hear carotid bruits no JVD. Lungs: Somewhat diminished but no rales or wheezes noted. Heart: S1, S2, regular rate and  rhythm no obvious murmur noted. On monitor patient is pacing. Abdomen: Obese, soft, nontender, positive bowel sounds. Do not palpate liver spleen or masses due to obesity. Extremities: Trace to 1+ edema bilaterally to mid shin. Neuro: Alert oriented white male moves all extremities follows commands and oriented x3    Assessment/Plan Patient Active Problem List  Diagnoses  . HYPERLIPIDEMIA  . HYPERTENSION  . MYOCARDIAL INFARCTION  . COPD  . Chronic respiratory failure  . CELLULITIS AND ABSCESS OF TRUNK  . OSA (obstructive sleep apnea)  . Unstable angina  . CAD (coronary artery disease)  . Obesity, morbid  . DM (diabetes mellitus), type 2 with complications  . Chronic a-fib  . Pacemaker    PLAN:  Patient has unstable angina with known coronary artery disease. We're starting IV nitroglycerin will titrate slowly as he has had hypotension with nitroglycerin sublingual. Will check INR as patient is on Coumadin if INR drops below 2 we'll begin IV heparin.  We'll hold the Coumadin for now as patient  may need cardiac catheterization. We will do serial cardiac enzymes and EKGs. We will admit the patient to a step down bed as he continues with chest pain. We sliding-scale insulin for his diabetes as well as 70/30 for now.  Patient and his wife are agreeable to this approach I have discussed it with both of them in the room. In MD to see for further recommendations.  INGOLD,LAURA R 06/15/2011, 4:13 PM   Agree with note written by Nada Boozer RNP  Pt well known to [place] with signif CAD, CAF, PTVPM. Other probs as outlined. Admitted with symptoms c/w [place]. Pain decreased with IV NTG. EKG paced. Enz pending. INR sub theraputic. Plan IV hep. Adm to stepdown. Cath tomorrow.   Runell Gess 06/15/2011 6:58 PM

## 2011-06-15 NOTE — ED Notes (Signed)
Meal tray ordered 

## 2011-06-15 NOTE — ED Notes (Signed)
Pt reports increasing discomfort, states is intermittent but intense. edp made aware and meds ordered.

## 2011-06-16 ENCOUNTER — Encounter (HOSPITAL_COMMUNITY): Payer: Self-pay | Admitting: Cardiovascular Disease

## 2011-06-16 ENCOUNTER — Other Ambulatory Visit: Payer: Self-pay

## 2011-06-16 ENCOUNTER — Encounter (HOSPITAL_COMMUNITY)
Admission: EM | Disposition: A | Discharge status: Home / Self Care | Payer: Self-pay | Source: Home / Self Care | Attending: Cardiology

## 2011-06-16 HISTORY — PX: PERCUTANEOUS CORONARY INTERVENTION-BALLOON ONLY: SHX6014

## 2011-06-16 HISTORY — PX: CARDIAC CATHETERIZATION: SHX172

## 2011-06-16 HISTORY — PX: LEFT HEART CATHETERIZATION WITH CORONARY ANGIOGRAM: SHX5451

## 2011-06-16 LAB — CBC
HCT: 36.5 % — ABNORMAL LOW (ref 39.0–52.0)
HCT: 35.5 % — ABNORMAL LOW (ref 39.0–52.0)
Hemoglobin: 11.6 g/dL — ABNORMAL LOW (ref 13.0–17.0)
Hemoglobin: 11.5 g/dL — ABNORMAL LOW (ref 13.0–17.0)
MCH: 28.4 pg (ref 26.0–34.0)
MCHC: 31.8 g/dL (ref 30.0–36.0)
MCHC: 32.4 g/dL (ref 30.0–36.0)
MCV: 89.5 fL (ref 78.0–100.0)
RDW: 15.6 % — ABNORMAL HIGH (ref 11.5–15.5)
RDW: 15.5 % (ref 11.5–15.5)
WBC: 6.5 10*3/uL (ref 4.0–10.5)

## 2011-06-16 LAB — GLUCOSE, CAPILLARY: Glucose-Capillary: 117 mg/dL — ABNORMAL HIGH (ref 70–99)

## 2011-06-16 LAB — BASIC METABOLIC PANEL
BUN: 25 mg/dL — ABNORMAL HIGH (ref 6–23)
CO2: 22 mEq/L (ref 19–32)
Calcium: 9.1 mg/dL (ref 8.4–10.5)
Creatinine, Ser: 1.39 mg/dL — ABNORMAL HIGH (ref 0.50–1.35)
Glucose, Bld: 127 mg/dL — ABNORMAL HIGH (ref 70–99)

## 2011-06-16 LAB — HEPARIN LEVEL (UNFRACTIONATED)
Heparin Unfractionated: 0.26 IU/mL — ABNORMAL LOW (ref 0.30–0.70)
Heparin Unfractionated: 0.38 IU/mL (ref 0.30–0.70)

## 2011-06-16 LAB — LIPID PANEL
HDL: 26 mg/dL — ABNORMAL LOW (ref >39)
LDL Cholesterol: 76 mg/dL (ref 0–99)
Total CHOL/HDL Ratio: 5 RATIO

## 2011-06-16 SURGERY — LEFT HEART CATHETERIZATION WITH CORONARY ANGIOGRAM
Anesthesia: LOCAL | Laterality: Right

## 2011-06-16 MED ORDER — TRAMADOL HCL 50 MG PO TABS
50.0000 mg | ORAL_TABLET | Freq: Four times a day (QID) | ORAL | Status: DC | PRN
  Filled 2011-06-16: qty 1

## 2011-06-16 MED ORDER — WARFARIN SODIUM 4 MG PO TABS
4.0000 mg | ORAL_TABLET | ORAL | Status: AC
  Administered 2011-06-16: 4 mg via ORAL
  Filled 2011-06-16 (×2): qty 1

## 2011-06-16 MED ORDER — HEPARIN (PORCINE) IN NACL 2-0.9 UNIT/ML-% IJ SOLN
INTRAMUSCULAR | Status: AC
  Filled 2011-06-16: qty 2000

## 2011-06-16 MED ORDER — LIDOCAINE HCL (PF) 1 % IJ SOLN
INTRAMUSCULAR | Status: AC
  Filled 2011-06-16: qty 30

## 2011-06-16 MED ORDER — BIVALIRUDIN 250 MG IV SOLR
INTRAVENOUS | Status: AC
  Filled 2011-06-16: qty 250

## 2011-06-16 MED ORDER — PRASUGREL HCL 10 MG PO TABS
ORAL_TABLET | ORAL | Status: AC
  Filled 2011-06-16: qty 6

## 2011-06-16 MED ORDER — FENTANYL CITRATE 0.05 MG/ML IJ SOLN
INTRAMUSCULAR | Status: AC
  Filled 2011-06-16: qty 2

## 2011-06-16 MED ORDER — PRASUGREL HCL 10 MG PO TABS
10.0000 mg | ORAL_TABLET | Freq: Every day | ORAL | Status: DC
  Administered 2011-06-17: 10 mg via ORAL
  Filled 2011-06-16: qty 1

## 2011-06-16 MED ORDER — DIPHENHYDRAMINE HCL 25 MG PO CAPS
50.0000 mg | ORAL_CAPSULE | Freq: Four times a day (QID) | ORAL | Status: DC | PRN
  Administered 2011-06-16 – 2011-06-17 (×4): 50 mg via ORAL
  Filled 2011-06-16 (×4): qty 2

## 2011-06-16 MED ORDER — MIDAZOLAM HCL 2 MG/2ML IJ SOLN
INTRAMUSCULAR | Status: AC
  Filled 2011-06-16: qty 2

## 2011-06-16 MED ORDER — NITROGLYCERIN 0.2 MG/ML ON CALL CATH LAB
INTRAVENOUS | Status: AC
  Filled 2011-06-16: qty 1

## 2011-06-16 MED ORDER — SODIUM CHLORIDE 0.9 % IV SOLN
INTRAVENOUS | Status: DC
  Administered 2011-06-16 – 2011-06-17 (×2): via INTRAVENOUS

## 2011-06-16 NOTE — Brief Op Note (Signed)
Cardiac Catherization/PCI note;  Bruce Taylor, 70 y.o., male  DICTATION # [phone], [phone]  Cardiac cath and Cutting Balloon atherotomy of RCA    [name], 06/16/2011, 5:01 PM

## 2011-06-16 NOTE — Progress Notes (Signed)
Placed pt. On CPAP auto titrate per pt.'s home settings via nasal mask with 2L O2 bled in. Pt. Is tolerating CPAP well at this time. All vitals are WNL.

## 2011-06-16 NOTE — Progress Notes (Signed)
ANTICOAGULATION CONSULT NOTE - Follow Up Consult  Pharmacy Consult for heparin and warfarin Indication: atrial fibrillation  Allergies  Allergen Reactions  . Chlorhexidine Gluconate Itching    Chg cloth  . Penicillins Swelling    Patient Measurements: Height: 5' 11.5" (181.6 cm) Weight: 307 lb 5.1 oz (139.4 kg) IBW/kg (Calculated) : 76.45    Vital Signs: Temp: 97.3 F (36.3 C) (11/21 1200) Temp src: Oral (11/21 1200) BP: 122/52 mmHg (11/21 1400) Pulse Rate: 56  (11/21 1414)  Labs:  Basename 06/16/11 1340 06/16/11 0720 06/16/11 0245 06/15/11 1618 06/15/11 1615 06/15/11 1229  HGB -- 11.6* -- -- -- 11.8*  HCT -- 36.5* -- -- -- 36.6*  PLT -- 187 -- -- -- 205  APTT -- -- -- -- 41* --  LABPROT -- 16.0* -- -- 16.6* --  INR -- 1.25 -- -- 1.32 --  HEPARINUNFRC 0.38 -- 0.26* -- -- --  CREATININE -- 1.39* -- -- -- 1.58*  CKTOTAL -- -- -- 129 -- --  CKMB -- -- -- 2.5 -- --  TROPONINI -- -- -- <0.30 -- --   Estimated Creatinine Clearance: 69.1 ml/min (by C-G formula based on Cr of 1.39).   Medications:  Scheduled:    . aspirin  324 mg Oral Pre-Cath  . aspirin EC  81 mg Oral Daily  . bivalirudin      . bivalirudin      . budesonide-formoterol  2 puff Inhalation BID  . carvedilol  6.25 mg Oral BID WC  . citalopram  40 mg Oral Daily  . diazepam  5 mg Oral On Call  . fentaNYL      . furosemide  40 mg Oral Daily  . heparin  4,000 Units Intravenous Once  . heparin      . influenza  inactive virus vaccine  0.5 mL Intramuscular Tomorrow-1000  . insulin aspart  0-15 Units Subcutaneous TID WC  . insulin aspart protamine-insulin aspart  40 Units Subcutaneous BID WC  . lidocaine      . lisinopril  10 mg Oral Daily  . midazolam      . nitroGLYCERIN      . oxybutynin  5 mg Oral BID  . pantoprazole  40 mg Oral Daily  . prasugrel      . primidone  100 mg Oral QHS  . primidone  50 mg Oral Daily  . spironolactone  25 mg Oral Daily  . DISCONTD: sodium chloride   Intravenous  Once  . DISCONTD: aspirin EC  81 mg Oral Daily  . DISCONTD: insulin aspart  0-9 Units Subcutaneous Q4H  . DISCONTD: primidone  50-100 mg Oral BID    Assessment: 70 yo M s/p cath this afternoon.  On wafarin PTA for afib, now asked to resume at half dose tonight, full-dose tomorrow.  Goal of Therapy:  INR 2-3 Heparin level 0.3-0.7 units/ml   Plan:  1. Warfarin 4 mg po x1 now. 2.  Check daily PT/INR 3.  Spoke with MD on call - Dr Royann Shivers to clarify heparin.  Per his instruction will d/c heparin for now - warfarin only.  Makyna Niehoff L. Illene Bolus, PharmD, BCPS Clinical Pharmacist Pager: [phone] 06/16/2011 6:10 PM

## 2011-06-16 NOTE — Progress Notes (Signed)
Patient ID: Bruce Taylor, male   DOB: 07/15/1941, 70 y.o.   MRN: [id number] Subjective:  No chest pain overnight.  Objective:  Vital Signs in the last 24 hours: Temp:  [97.2 F (36.2 C)-98.5 F (36.9 C)] 97.2 F (36.2 C) (11/21 0800) Pulse Rate:  [55-67] 55  (11/21 0340) Resp:  [14-23] 17  (11/21 0340) BP: (78-152)/(44-137) 107/63 mmHg (11/21 0800) SpO2:  [90 %-97 %] 96 % (11/21 0340) FiO2 (%):  [2 %] 2 % (11/20 2100) Weight:  [139.4 kg (307 lb 5.1 oz)-141.069 kg (311 lb)] 307 lb 5.1 oz (139.4 kg) (11/21 0500)  Intake/Output from previous day: 11/20 0701 - 11/21 0700 In: -  Out: 900 [Urine:900]  Physical Exam: General appearance: alert, cooperative and no distress Lungs: decreased breath sounds bilat Heart:  RRR, decreased heart sounds   Rate: 56  Rhythm: sinus bradycardia  Lab Results:  Basename 06/16/11 0720 06/15/11 1229  WBC 5.4 6.8  HGB 11.6* 11.8*  PLT 187 205    Basename 06/16/11 0720 06/15/11 1229  NA 134* 134*  K 4.9 5.0  CL 101 99  CO2 22 27  GLUCOSE 127* 94  BUN 25* 28*  CREATININE 1.39* 1.58*    Basename 06/15/11 1618  TROPONINI <0.30   Hepatic Function Panel No results found for this basename: PROT,ALBUMIN,AST,ALT,ALKPHOS,BILITOT,BILIDIR,IBILI in the last 72 hours  Basename 06/16/11 0720  CHOL 131    Basename 06/16/11 0720  INR 1.25    Imaging: Dg Chest Port 1 View  06/15/2011  *RADIOLOGY REPORT*  Clinical Data: Short of breath.  Rule out CHF.  PORTABLE CHEST - 1 VIEW  Comparison: 02/10/2011  Findings: Mildly degraded exam due to AP portable technique and patient body habitus.  Single lead pacer with leads right ventricle.  Cardiomegaly accentuated by AP portable technique.  No right pleural effusion.  Limited evaluation of the left pleural space due to overlying soft tissues. No pneumothorax.  Mild pulmonary venous congestion, accentuated by AP portable technique.  Right lung is clear.  Degraded evaluation of the left lung base.   IMPRESSION:  1.  Cardiomegaly and low lung volumes with mild pulmonary venous congestion. 2.  Limited evaluation of the left pleural space and left lung base.  Original Report Authenticated By: Consuello Bossier, M.D.    Cardiac Studies:  Assessment/Plan:   Principal Problem:  *Unstable angina Active Problems:  CAD (coronary artery disease)  Obesity, morbid  DM (diabetes mellitus), type 2 with complications  Chronic a-fib  Pacemaker   PLAN- CATH TODAY    Corine Shelter PA-C 06/16/2011, 9:17 AM   Patient seen and examined. Agree with assessment and plan. No further chest pain since admission.  Discussed cath with possible PCI with pt and wife.  Plan today. Belladonna Lubinski A 06/16/2011 9:29 AM

## 2011-06-16 NOTE — Progress Notes (Signed)
Physical Therapy Cancel Patient Details Name: AKEEN LEDYARD MRN: [id number] DOB: Nov 27, 1940 Today's Date: 06/16/2011  Pt getting ready to go off floor to Cath Lab.  PT will follow.  Thanks!!  Anuj Summons 06/16/2011, 2:01 PM

## 2011-06-16 NOTE — Progress Notes (Signed)
ANTICOAGULATION CONSULT NOTE - Follow Up Consult  Pharmacy Consult for Heparin Indication: [place]  Allergies  Allergen Reactions  . Penicillins Swelling    Patient Measurements: Height: 5' 11.5" (181.6 cm) Weight: 308 lb 6.8 oz (139.9 kg) IBW/kg (Calculated) : 76.45  Adjusted Body Weight: 109 kg  Vital Signs: Temp: 98.4 F (36.9 C) (11/21 0400) Temp src: Oral (11/21 0400) BP: 152/73 mmHg (11/20 2326) Pulse Rate: 55  (11/20 2326)  Labs:  Basename 06/16/11 0245 06/15/11 1618 06/15/11 1615 06/15/11 1229  HGB -- -- -- 11.8*  HCT -- -- -- 36.6*  PLT -- -- -- 205  APTT -- -- 41* --  LABPROT -- -- 16.6* --  INR -- -- 1.32 --  HEPARINUNFRC 0.26* -- -- --  CREATININE -- -- -- 1.58*  CKTOTAL -- 129 -- --  CKMB -- 2.5 -- --  TROPONINI -- <0.30 -- --   Estimated Creatinine Clearance: 60.9 ml/min (by C-G formula based on Cr of 1.58).   Assessment: Heparin level subtherapeutic. No issues noted with line or bleeding. For cath today.  Goal of Therapy:  Heparin level 0.3-0.7 units/ml   Plan:  1. Increase heparin to 1700 units/hr 2. F/u 8 hr level or post cath  Lavonia Dana 06/16/2011,4:14 AM

## 2011-06-16 NOTE — Progress Notes (Signed)
TO CATH. LAB BY BED STABLE. REPORT GIVEN TO STAFF.

## 2011-06-16 NOTE — Cardiovascular Report (Signed)
NAME:  [name], [name] NO.:  [id number]  MEDICAL RECORD NO.:  [id number]  LOCATION:  2502                         FACILITY:  [facility]  PHYSICIAN:  Nicki Guadalajara, M.D.     DATE OF BIRTH:  [birth year]  DATE OF PROCEDURE:  06/16/2011 DATE OF DISCHARGE:                           CARDIAC CATHETERIZATION   PROCEDURE:  Cardiac catheterization:  [facility] coronary angiography; left ventriculography; percutaneous coronary intervention to the right coronary artery with cutting balloon atherotomy/PTCA.  INDICATIONS:  [name] is a 70 year old patient of Dr. Clarene Duke.  He has history of known coronary artery disease and is status post multiple interventions in the past with stents being placed in his LAD, circumflex, as well as proximal mid right coronary arteries.  In April 2012, he was found to have in-stent restenosis in his RCA stent region.  The patient has a history of atrial fibrillation on chronic Coumadin therapy, a history of pacemaker for complete heart block, sleep apnea, morbid obesity, COPD, type 2 diabetes mellitus, hypertension, as well as hyperlipidemia.  Because of his need for Coumadin, bare-metal stenting has been used.  On October 27, 2010, he underwent cutting balloon atherotomy to his RCA and had insertion of a new 3.0 x12 mm bare-metal non DES Medtronic Integrity stent,  which was placed in the distal aspect of the previously placed stent to cover the 95% stenosis. Subsequently, the patient has done well.  Apparently, he was admitted to [facility] yesterday with recurrent chest pain worrisome for unstable angina.  He states the pain was similar to previous discomfort. He now presents for catheterization with possible repeat intervention.  PROCEDURE:  After premedication with Versed 1 mg plus fentanyl 25 mcg, the patient was prepped and draped in usual fashion.  His right femoral artery was punctured anteriorly and a 5-French sheath was  inserted. Diagnostic cardiac catheterization was done utilizing 5-French Judkins, 4 left and right coronary catheters.  A 5-French pigtail catheter was used for RAO ventriculography.  With the demonstration of diffuse in-stent narrowing in the RCA, the decision was made to attempt intervention to this vessel.  Again, there were bare-metal stents in the right coronary artery, and the patient has been on Coumadin.  For this reason, it was felt not to place an additional stent within the other stents.  The sheath was upgraded to a 6-French system.  He received additional Versed plus fentanyl for conscious sedation.  He was started on Angiomax bolus plus infusion. The patient received 60 mg of Effient in the laboratory.  ACT was documented to be therapeutic.  A 6-French right guiding catheter was used.  A Prowater wire was advanced down into the distal right coronary artery.  A 3.25 x 15 mm Flextome cutting balloon arthrotomy catheter was then inserted and multiple dilatations were made from the distal aspect of the more distal stent to the very proximal aspect of the proximal stent.  This was dilated multiple times in progressive fashion up to 8 atmospheres distally but up to 11 atmospheres proximally.  A 3.5 x 20 mm noncompliant Trek balloon was then used for high-pressure noncompliant dilatation up to approximately 3.51 mm proximally and 3.4 mm distally. Scout angiography confirmed an  excellent angiographic result.  The arterial sheath was sutured in place with plans for sheath removal later today.  HEMODYNAMIC DATA:  Central aortic pressure is 120/70, left ventricular pressure 120/60, post A-wave 31.  Left ventriculography:  Left ventriculography revealed ejection fraction of approximately 50%.  There was mild mid anterolateral hypocontractility and distal inferior hypocontractility.  ANGIOGRAPHIC DATA:  Left main coronary artery was angiographically normal and bifurcates into the  LAD and left circumflex system.  The previously placed stent in the ostium and the first diagonal vessel in the LAD just beyond the diagonal takeoff were widely patent.  The remainder of the LAD was free of significant disease.  The circumflex vessel gave rise to a bifurcating marginal vessel.  The stent in the ostial proximal portion of this marginal vessel was patent. There was mild 20% luminal irregularity in the AV groove circumflex.  The right coronary artery had previously placed tandem bare-metal stents extending proximally to the mid segment ending before the acute margin. There was 90% focal in-stent narrowing after a branch in this stented segment with diffuse 50% mid narrowing and then 70-80% narrowing in the distal aspect of the stent.  Following cutting balloon atherotomy with a 3.25 Flextome cutting balloon and noncompliant balloon dilatation ranging from 3.51 down at 3.4 mm, the entire segment was reduced to less than 5%.  There was a brisk TIMI-3 flow.  There was no evidence for dissection.  IMPRESSION: 1. Low normal left ventricular function with mild anterolateral and     distal inferior hypocontractility. 2. Widely patent stents in the first diagonal and left anterior     descending just beyond the diagonal takeoff without significant     restenosis. 3. Widely patent stent in the circumflex marginal vessel with mild 20%     atrioventricular groove narrowing. 4. In-stent restenosis in the previously placed tandem bare-metal     right coronary artery stents with narrowing of 90% in the proximal     portion, diffuse 50% mid stenosis followed by 70-80% more distal in-     stent narrowing. 5. Successful percutaneous coronary intervention utilizing cutting     balloon atherotomy/noncompliant balloon Percutaneous transluminal     coronary angioplasty with the diffuse stenoses being reduced to     less than 5%. 6. Bivalirudin/60 mg oral Effient/intracoronary  nitroglycerin.          ______________________________ Nicki Guadalajara, M.D.     TK/MEDQ  D:  06/16/2011  T:  06/16/2011  Job:  [id number]

## 2011-06-16 NOTE — Progress Notes (Signed)
ANTICOAGULATION CONSULT NOTE - Follow Up Consult  Pharmacy Consult for Heparin Indication: [place]  Allergies  Allergen Reactions  . Chlorhexidine Gluconate Itching    Chg cloth  . Penicillins Swelling    Patient Measurements: Height: 5' 11.5" (181.6 cm) Weight: 307 lb 5.1 oz (139.4 kg) IBW/kg (Calculated) : 76.45  Adjusted Body Weight: 109 kg  Vital Signs: Temp: 97.3 F (36.3 C) (11/21 1200) Temp src: Oral (11/21 1200) BP: 122/52 mmHg (11/21 1400) Pulse Rate: 54  (11/21 1400)  Labs:  Basename 06/16/11 1340 06/16/11 0720 06/16/11 0245 06/15/11 1618 06/15/11 1615 06/15/11 1229  HGB -- 11.6* -- -- -- 11.8*  HCT -- 36.5* -- -- -- 36.6*  PLT -- 187 -- -- -- 205  APTT -- -- -- -- 41* --  LABPROT -- 16.0* -- -- 16.6* --  INR -- 1.25 -- -- 1.32 --  HEPARINUNFRC 0.38 -- 0.26* -- -- --  CREATININE -- 1.39* -- -- -- 1.58*  CKTOTAL -- -- -- 129 -- --  CKMB -- -- -- 2.5 -- --  TROPONINI -- -- -- <0.30 -- --   Estimated Creatinine Clearance: 69.1 ml/min (by C-G formula based on Cr of 1.39).   Assessment: Heparin level subtherapeutic. No issues noted with line or bleeding. For cath today.  Goal of Therapy:  Heparin level 0.3-0.7 units/ml   Plan:  1.  No change to rate 2.  F/U post cath  Chinita Greenland 06/16/2011,2:42 PM

## 2011-06-16 NOTE — Progress Notes (Signed)
angiomax complete and DC'd

## 2011-06-17 ENCOUNTER — Encounter (HOSPITAL_COMMUNITY): Payer: Self-pay | Admitting: Cardiology

## 2011-06-17 ENCOUNTER — Other Ambulatory Visit: Payer: Self-pay

## 2011-06-17 DIAGNOSIS — I2510 Atherosclerotic heart disease of native coronary artery without angina pectoris: Secondary | ICD-10-CM | POA: Diagnosis present

## 2011-06-17 LAB — BASIC METABOLIC PANEL
CO2: 22 mEq/L (ref 19–32)
Calcium: 8.8 mg/dL (ref 8.4–10.5)
GFR calc non Af Amer: 55 mL/min — ABNORMAL LOW (ref >90)
Sodium: 133 mEq/L — ABNORMAL LOW (ref 135–145)

## 2011-06-17 LAB — PROTIME-INR
INR: 1.2 (ref 0.00–1.49)
Prothrombin Time: 15.5 seconds — ABNORMAL HIGH (ref 11.6–15.2)

## 2011-06-17 LAB — CBC
Hemoglobin: 11.6 g/dL — ABNORMAL LOW (ref 13.0–17.0)
MCH: 28.5 pg (ref 26.0–34.0)
MCHC: 32.1 g/dL (ref 30.0–36.0)
Platelets: 181 10*3/uL (ref 150–400)
RBC: 4.07 MIL/uL — ABNORMAL LOW (ref 4.22–5.81)

## 2011-06-17 MED ORDER — WARFARIN VIDEO
Freq: Once | Status: DC
  Filled 2011-06-17: qty 1

## 2011-06-17 MED ORDER — WARFARIN SODIUM 7.5 MG PO TABS
7.5000 mg | ORAL_TABLET | Freq: Once | ORAL | Status: DC
  Filled 2011-06-17: qty 1

## 2011-06-17 MED ORDER — PATIENT'S GUIDE TO USING COUMADIN BOOK
Freq: Once | Status: DC
  Filled 2011-06-17: qty 1

## 2011-06-17 MED ORDER — PRASUGREL HCL 10 MG PO TABS: 10.0000 mg | ORAL_TABLET | Freq: Every day | ORAL | Status: DC

## 2011-06-17 MED ORDER — NITROGLYCERIN 0.4 MG SL SUBL: 0.4000 mg | SUBLINGUAL_TABLET | SUBLINGUAL | Status: DC | PRN

## 2011-06-17 MED FILL — Dextrose Inj 5%: INTRAVENOUS | Qty: 50 | Status: AC

## 2011-06-17 MED FILL — Dextrose Inj 5%: INTRAVENOUS | Qty: 100 | Status: AC

## 2011-06-17 NOTE — Discharge Summary (Signed)
Physician Discharge Summary  Patient ID: Bruce Taylor MRN: [id number] DOB/AGE: 11-30-1940 70 y.o.  Admit date: 06/15/2011 Discharge date: 06/17/2011  Discharge Diagnoses:  Principal Problem:  *Unstable angina Active Problems:  CAD (coronary artery disease)  Recurrent coronary arteriosclerosis following PTCA  Obesity, morbid  DM (diabetes mellitus), type 2 with complications  Chronic a-fib  Pacemaker   Discharged Condition: good  Hospital Course: 70 year old white married male patient of Dr. Julieanne Manson was admitted to [facility] after he presented to the emergency room with chest pain.  Sublingual nitroglycerin and IV morphine assisted with pain relief but once he was placed on IV nitroglycerin drip the pain resolved. EKG was without acute changes he was pacing with underlying atrial fibrillation which he has chronically. Additionally he is on Coumadin the INR on admission was 1.32.  He has a history of coronary disease with prior stents to the right coronary, LAD and the left circumflex.with recent stent in April of 2012. On the day of admission he had been in these usual state of no chest pain and while sitting developed chest pain described as very heavy with 9/10 pain on 1-10 scale he had no associated symptoms of nausea, diaphoresis or any increase in his chronic shortness of breath. He was wearing his chronic oxygen at 2 L at the time of the pain. Sublingual nitroglycerin improved his pain, morphine improved his pain but the pain was not resolved until placed on a nitroglycerin drip. Cardiac enzymes on admission were negative, but  followup  Cardiac enzymes that were ordered and do not appear to have been done.  Patient underwent cardiac catheterization as we recommended on 06/16/2011 and was found to have in-stent restenosis within the stents of the right coronary artery. He underwent cutting balloon atherotomy of the RCA by Dr. Nicki Guadalajara. He tolerated the  procedure well. By the next morning 06/17/2011 he ambulated without complications and was ready for discharge home.  During hospitalization patient continued to CPAP for his obstructive sleep apnea, his diabetes was managed with sliding scale insulin as well his home insulin dose.   He will continue his continuous oxygen at home.  It was decided his Coumadin was being restarted he'll take 7.5 mg daily and followup at our Coumadin clinic on Monday, 06/21/2011. He will continue aspirin Coumadin and [place] and for one month at one month the aspirin is recommended to be DC'd. Dr. Clarene Duke will see him prior to that point.        Consults: none  Significant Diagnostic Studies:  Laboratory data at discharge sodium 133 potassium 4.8 chloride 101 CO2 22 BUN 20 creatinine 1.27 calcium 8.8 glucose 128.   Total cholesterol 131 triglycerides 144 HDL 26 LDL 76.  Hemoglobin 11.6 hematocrit 36.1 WBC 7.9 and platelets 181.  At discharge protime 15.5 INR of 1.20  Hemoglobin A1c 6.8, TSH 1.81.  EKGs revealed no acute changes he was chronic A. Fib and ventricular paced.  Chest x-ray portable on admission: low lung volumes  Discharge Exam: Blood pressure 127/65, pulse 55, temperature 97.6 F (36.4 C), temperature source Oral, resp. rate 18, height 5' 11.5" (1.816 m), weight 143.9 kg (317 lb 3.9 oz), SpO2 95.00%.  See Dr Elissa Hefty d/c note.  No changes from that note.  Disposition: Home or Self Care  Discharge Orders    Future Appointments: Provider: Department: Dept Phone: Center:   07/14/2011 11:00 AM Barbaraann Share, MD [facility] [phone] None     Discharge Medication List as  of 06/17/2011 12:21 PM    START taking these medications   Details  nitroGLYCERIN (NITROSTAT) 0.4 MG SL tablet Place 1 tablet (0.4 mg total) under the tongue every 5 (five) minutes as needed for chest pain., Starting 06/17/2011, Until Fri 06/16/12, Print    prasugrel (EFFIENT) 10 MG TABS Take 1 tablet (10 mg  total) by mouth daily., Starting 06/17/2011, Until Discontinued, Print      CONTINUE these medications which have NOT CHANGED   Details  albuterol (PROAIR HFA) 108 (90 BASE) MCG/ACT inhaler Inhale 1-2 puffs into the lungs every 6 (six) hours as needed. For shortness of breath, Until Discontinued, Historical Med    aspirin EC 81 MG tablet Take 81 mg by mouth daily.  , Until Discontinued, Historical Med    budesonide-formoterol (SYMBICORT) 160-4.5 MCG/ACT inhaler Inhale 2 puffs into the lungs 2 (two) times daily. , Until Discontinued, Historical Med    carvedilol (COREG) 6.25 MG tablet Take by mouth 2 (two) times daily with a meal. Take 1 1/2 tabs each morning and 1 tab each evening, Until Discontinued, Historical Med    citalopram (CELEXA) 40 MG tablet Take 40 mg by mouth daily.  , Until Discontinued, Historical Med    furosemide (LASIX) 40 MG tablet Take 40 mg by mouth daily.  , Until Discontinued, Historical Med    insulin aspart protamine-insulin aspart (NOVOLOG 70/30) (70-30) 100 UNIT/ML injection Inject 30-45 Units into the skin 2 (two) times daily with a meal. , Until Discontinued, Historical Med    isosorbide mononitrate (IMDUR) 30 MG 24 hr tablet Take 30 mg by mouth daily.  , Until Discontinued, Historical Med    lisinopril (PRINIVIL,ZESTRIL) 20 MG tablet Take 10 mg by mouth daily.  , Until Discontinued, Historical Med    oxybutynin (DITROPAN) 5 MG tablet Take 5 mg by mouth 2 (two) times daily.  , Until Discontinued, Historical Med    pantoprazole (PROTONIX) 40 MG tablet Take 40 mg by mouth daily.  , Until Discontinued, Historical Med    primidone (MYSOLINE) 50 MG tablet Take 50-100 mg by mouth 2 (two) times daily. 1 tab in the morning and 2 tabs at night , Until Discontinued, Historical Med    spironolactone (ALDACTONE) 25 MG tablet Take 25 mg by mouth daily.  , Until Discontinued, Historical Med    warfarin (COUMADIN) 5 MG tablet Take 5-7.5 mg by mouth every evening. Take 5 mg  on Monday and fridays then 7.5mg  all other days, Until Discontinued, Historical Med       Follow-up Information    Follow up with LITTLE, ALFRED B, MD in 2 weeks. (Our office will call you Monday with date and time of appt.)    Contact information:   [address] [place] [address] [phone]       Follow up with [facility] in 4 days. (Our office will call you Monday with the time.)    Contact information:   [address] [place], [place]         Signed: Leone Brand 06/17/2011, 2:57 PM

## 2011-06-17 NOTE — Progress Notes (Signed)
   CARE MANAGEMENT NOTE 06/17/2011  Patient:  BOLTON, CANUPP   Account Number:  [id number]  Date Initiated:  06/17/2011  Documentation initiated by:  SIMMONS,Alaijah Gibler  Subjective/Objective Assessment:   received referral for effient assistance. pt currently in donut-hole; per pt, will be able to fill RX on Mon 06/21/11.     Action/Plan:   contacted pharmacy- pt is eligible for zz fund; RN dispensed today's dose already, pt will receive 3 day supply from pharmacy to cover until Mon.   Anticipated DC Date:  06/17/2011   Anticipated DC Plan:  HOME/SELF CARE  In-house referral  NA      DC Planning Services  CM consult  Medication Assistance      PAC Choice  NA   Choice offered to / List presented to:  NA   DME arranged  NA      DME agency  NA     HH arranged  NA      HH agency  NA   Status of service:  Completed, signed off Medicare Important Message given?   (If response is "NO", the following Medicare IM given date fields will be blank) Date Medicare IM given:   Date Additional Medicare IM given:    Discharge Disposition:  HOME/SELF CARE  Per UR Regulation:    Comments:

## 2011-06-17 NOTE — Progress Notes (Signed)
Site area:  RIGHT groin  Site Prior to Removal:  Level 0  Pressure Applied For 25 MINUTES    Minutes Beginning at 2015  Manual:   yes  Patient Status During Pull:  Stable, tolerated well  Post Pull Groin Site:  Level 0  Post Pull Instructions Given:  yes  Post Pull Pulses Present:  yes 1+ DP  Dressing Applied:  yes  Comments:  See frequent vs flowsheet

## 2011-06-17 NOTE — Progress Notes (Signed)
Subjective: No chest pain, chronic SOB. Desat to 89% on 2L O2 - he usually goes up to 5L when ambulating at home. Using CPAP to recover. -- says he is at his baseline.  Objective: Vital signs in last 24 hours: Temp:  [97.3 F (36.3 C)-98.6 F (37 C)] 97.6 F (36.4 C) (11/22 0739) Pulse Rate:  [54-60] 55  (11/22 0739) Resp:  [12-24] 18  (11/22 0739) BP: (121-152)/(48-89) 127/65 mmHg (11/22 0739) SpO2:  [95 %-100 %] 95 % (11/22 0813) Weight:  [143.9 kg (317 lb 3.9 oz)] 317 lb 3.9 oz (143.9 kg) (11/22 0310) Weight change: 2.831 kg (6 lb 3.9 oz) Last BM Date: 06/16/11 Intake/Output from previous day: +877 11/21 0701 - 11/22 0700 In: 3322.1 [P.O.:240; I.V.:3082.1] Out: 2300 [Urine:2300] Intake/Output this shift: Total I/O In: -  Out: 400 [Urine:400]  PE: General appearance: alert, appears stated age, no distress, morbidly obese and has baseline esseential tremor, used CPAP when lying down. Neck: no adenopathy, no carotid bruit, supple, symmetrical, trachea midline and Unable to adequately assess thyroid size, or JVP due to body habitus.  no bruit Lungs: clear to auscultation bilaterally and normal effort - on CPAP.  Heart: Distant S1 S2 with paced rhythm.  unable to palpate PMI.  No loud murmur or gallop noted. Abdomen: soft, non-tender; bowel sounds normal; no masses,  no organomegaly and morbidly obese- unable to assess for organomegally Extremities: extremities normal, atraumatic, no cyanosis or edema, edema 1+ and venous stasis dermatitis noted Pulses: 1+ symmetric Skin: ecchymoses - scattered, arm(s) right Neurologic: Alert and oriented X 3, normal strength and tone. Normal symmetric reflexes. Normal coordination and gait  Right groin, C/D/I Lab Results:  Basename 06/17/11 0530 06/16/11 2012  WBC 7.9 6.5  HGB 11.6* 11.5*  HCT 36.1* 35.5*  PLT 181 186   BMET  Basename 06/17/11 0530 06/16/11 0720  NA 133* 134*  K 4.8 4.9  CL 101 101  CO2 22 22  GLUCOSE 128* 127*    BUN 20 25*  CREATININE 1.27 1.39*  CALCIUM 8.8 9.1    Basename 06/15/11 1618  TROPONINI <0.30    Lab Results  Component Value Date   CHOL 131 06/16/2011   HDL 26* 06/16/2011   LDLCALC 76 06/16/2011   TRIG 144 06/16/2011   CHOLHDL 5.0 06/16/2011   Lab Results  Component Value Date   HGBA1C 6.8* 06/15/2011     Lab Results  Component Value Date   TSH 1.810 06/15/2011    Hepatic Function Panel No results found for this basename: PROT,ALBUMIN,AST,ALT,ALKPHOS,BILITOT,BILIDIR,IBILI in the last 72 hours  Basename 06/16/11 0720  CHOL 131   No results found for this basename: PROTIME in the last 72 hours    EKG: Orders placed during the hospital encounter of 06/15/11  . EKG 12-LEAD  . EKG 12-LEAD  . EKG 12-LEAD    Studies/Results: Dg Chest Port 1 View  06/15/2011  *RADIOLOGY REPORT*  Clinical Data: Short of breath.  Rule out CHF.  PORTABLE CHEST - 1 VIEW  Comparison: 02/10/2011  Findings: Mildly degraded exam due to AP portable technique and patient body habitus.  Single lead pacer with leads right ventricle.  Cardiomegaly accentuated by AP portable technique.  No right pleural effusion.  Limited evaluation of the left pleural space due to overlying soft tissues. No pneumothorax.  Mild pulmonary venous congestion, accentuated by AP portable technique.  Right lung is clear.  Degraded evaluation of the left lung base.  IMPRESSION:  1.  Cardiomegaly and low  lung volumes with mild pulmonary venous congestion. 2.  Limited evaluation of the left pleural space and left lung base.  Original Report Authenticated By: Consuello Bossier, M.D.    Medications: I have reviewed the patient's current medications.  Assessment/Plan: Patient Active Problem List  Diagnoses  . HYPERLIPIDEMIA  . HYPERTENSION  . COPD  . Chronic respiratory failure  . CELLULITIS AND ABSCESS OF TRUNK  . OSA (obstructive sleep apnea)  . Unstable angina  . CAD (coronary artery disease)  . Obesity, morbid  . DM  (diabetes mellitus), type 2 with complications  . Chronic a-fib  . Pacemaker  . Recurrent coronary arteriosclerosis following PTCA   PLAN:  Admitted  With [place], cath reveals instent  Restenosis of stents to RCA undergoing cutting balloon athrectomy.   DM stable, resume home insulin dose at d/c. Chronic A fib, resuming coumadin.  D/w Dr. Clarene Duke - will not bridge, just up-titrate dose.    LOS: 2 days   INGOLD,LAURA R 06/17/2011, 8:19 AM  I have seen and examined the patient along with Nada Boozer, NP.  I have reviewed the chart, notes and new data.  I agree with Laura's note.  Key new complaints: He says he is at his baseline Key examination changes: Right groin is stable.  Paced rhythm Key new findings / data: H/H & Chem panel stable.  INR 1.2  PLAN: He is stable for d/c today. Will increase Coumadin dose for d/c -- 7.5mg  x 3 days, then return to previous dosing interval.  F/u INR @ [facility] on Monday with Phylis Bougie (PharmD). Plan per d/w Dr. Clarene Duke - will do ASA/Effient & Coumadin x 1 month, then d/c ASA.  Continue other home meds.  Bentley Fissel W 06/17/2011, 10:16 AM

## 2011-06-17 NOTE — Progress Notes (Signed)
ANTICOAGULATION CONSULT NOTE - Follow Up Consult  Pharmacy Consult for heparin and warfarin Indication: atrial fibrillation  Allergies  Allergen Reactions  . Chlorhexidine Gluconate Itching    Chg cloth  . Penicillins Swelling    Patient Measurements: Height: 5' 11.5" (181.6 cm) Weight: 317 lb 3.9 oz (143.9 kg) IBW/kg (Calculated) : 76.45    Vital Signs: Temp: 97.6 F (36.4 C) (11/22 0739) Temp src: Oral (11/22 0739) BP: 127/65 mmHg (11/22 0739) Pulse Rate: 55  (11/22 0739)  Labs:  Basename 06/17/11 0530 06/16/11 2012 06/16/11 1340 06/16/11 0720 06/16/11 0245 06/15/11 1618 06/15/11 1615 06/15/11 1229  HGB 11.6* 11.5* -- -- -- -- -- --  HCT 36.1* 35.5* -- 36.5* -- -- -- --  PLT 181 186 -- 187 -- -- -- --  APTT -- -- -- -- -- -- 41* --  LABPROT 15.5* -- -- 16.0* -- -- 16.6* --  INR 1.20 -- -- 1.25 -- -- 1.32 --  HEPARINUNFRC -- -- 0.38 -- 0.26* -- -- --  CREATININE 1.27 -- -- 1.39* -- -- -- 1.58*  CKTOTAL -- -- -- -- -- 129 -- --  CKMB -- -- -- -- -- 2.5 -- --  TROPONINI -- -- -- -- -- <0.30 -- --   Estimated Creatinine Clearance: 77 ml/min (by C-G formula based on Cr of 1.27).   Medications:  Scheduled:     . bivalirudin      . bivalirudin      . budesonide-formoterol  2 puff Inhalation BID  . carvedilol  6.25 mg Oral BID WC  . citalopram  40 mg Oral Daily  . diazepam  5 mg Oral On Call  . fentaNYL      . furosemide  40 mg Oral Daily  . heparin      . influenza  inactive virus vaccine  0.5 mL Intramuscular Tomorrow-1000  . insulin aspart  0-15 Units Subcutaneous TID WC  . insulin aspart protamine-insulin aspart  40 Units Subcutaneous BID WC  . lidocaine      . lisinopril  10 mg Oral Daily  . midazolam      . nitroGLYCERIN      . oxybutynin  5 mg Oral BID  . pantoprazole  40 mg Oral Daily  . prasugrel      . prasugrel  10 mg Oral Daily  . primidone  100 mg Oral QHS  . primidone  50 mg Oral Daily  . spironolactone  25 mg Oral Daily  . warfarin  4 mg  Oral NOW  . DISCONTD: aspirin EC  81 mg Oral Daily  . DISCONTD: aspirin EC  81 mg Oral Daily    Assessment: 70 yo M s/p cath this afternoon.  On wafarin PTA for afib, INR low after half dose last night of coumadin. Will increase today. Heparin is off per MD  Goal of Therapy:  INR 2-3   Plan:  1. Warfarin 7.5 mg po x1. 2.  Check daily PT/INR   Santina Evans, PharmD Clinical Pharmacist Pager: [phone] 06/17/2011 10:25 AM

## 2011-06-19 ENCOUNTER — Encounter (HOSPITAL_COMMUNITY): Payer: Self-pay | Admitting: *Deleted

## 2011-07-14 ENCOUNTER — Encounter: Payer: Self-pay | Admitting: Pulmonary Disease

## 2011-07-14 ENCOUNTER — Ambulatory Visit (INDEPENDENT_AMBULATORY_CARE_PROVIDER_SITE_OTHER): Payer: Medicare Other | Admitting: Pulmonary Disease

## 2011-07-14 DIAGNOSIS — J449 Chronic obstructive pulmonary disease, unspecified: Secondary | ICD-10-CM

## 2011-07-14 DIAGNOSIS — G4733 Obstructive sleep apnea (adult) (pediatric): Secondary | ICD-10-CM

## 2011-07-14 DIAGNOSIS — J9610 Chronic respiratory failure, unspecified whether with hypoxia or hypercapnia: Secondary | ICD-10-CM

## 2011-07-14 NOTE — Progress Notes (Signed)
  Subjective:    Patient ID: Bruce Taylor, male    DOB: [birth year], 70 y.o.   MRN: [id number]  HPI Patient comes in today for followup of his known obstructive sleep apnea and obesity hypoventilation, as well as moderate COPD.  He was recently in the hospital last month with an episode of chest pain and worsening dyspnea, and found to have obstruction of one of his cardiac stents.  He was also noted to have mild edema on chest x-ray.  He has undergone PCI, and has felt much better.  He continues to have significant dyspnea on exertion related to both heart and lung disease, as well as his underlying morbid obesity and deconditioning.  He denies any significant cough, congestion, or mucus.  He has been wearing his CPAP compliantly, and reports no issues with his mask fit or pressure.   Review of Systems  Constitutional: Negative for fever and unexpected weight change.  HENT: Positive for congestion, rhinorrhea, sneezing, trouble swallowing, postnasal drip and sinus pressure. Negative for ear pain, nosebleeds, sore throat and dental problem.   Eyes: Positive for redness. Negative for itching.  Respiratory: Positive for cough, shortness of breath and wheezing. Negative for chest tightness.   Cardiovascular: Positive for palpitations and leg swelling.  Gastrointestinal: Negative for nausea and vomiting.  Genitourinary: Positive for urgency and frequency. Negative for dysuria.  Musculoskeletal: Negative for joint swelling.  Skin: Negative for rash.  Neurological: Positive for headaches.  Hematological: Does not bruise/bleed easily.  Psychiatric/Behavioral: Negative for dysphoric mood. The patient is nervous/anxious.        Objective:   Physical Exam Morbidly obese male in no acute distress No skin breakdown or pressure necrosis from the CPAP mask Chest with decreased breath sounds throughout, a few basilar crackles, no wheezing Cardiac exam with a controlled ventricular response, no  definite murmurs. Lower extremities with mild edema, no cyanosis Alert and oriented, does not appear to be sleepy, moves all 4 extremities.       Assessment & Plan:

## 2011-07-14 NOTE — Assessment & Plan Note (Signed)
The patient has multifactorial chronic respiratory failure, and is requiring supplemental oxygen to maintain saturations.  We'll continue to manage his underlying medical issues, but I have stressed to him again the importance of weight reduction.

## 2011-07-14 NOTE — Patient Instructions (Signed)
Continue on symbicort and oxygen Stay on cpap, and work aggressively on weight loss followup with me in 6mos

## 2011-07-14 NOTE — Assessment & Plan Note (Signed)
The patient is wearing CPAP compliantly, and reports no issues with his mask or CPAP device.  I have asked him to work aggressively on weight loss.

## 2011-07-14 NOTE — Assessment & Plan Note (Signed)
The patient has known moderate obstructive disease, and has been doing well on his current bronchodilator regimen.  I have asked him to continue on this.

## 2011-12-08 ENCOUNTER — Encounter: Payer: Self-pay | Admitting: Pulmonary Disease

## 2011-12-08 ENCOUNTER — Ambulatory Visit (INDEPENDENT_AMBULATORY_CARE_PROVIDER_SITE_OTHER): Payer: Medicare Other | Admitting: Pulmonary Disease

## 2011-12-08 VITALS — BP 106/62 | HR 57 | Temp 97.8°F | Ht 71.0 in | Wt 308.4 lb

## 2011-12-08 DIAGNOSIS — J4489 Other specified chronic obstructive pulmonary disease: Secondary | ICD-10-CM

## 2011-12-08 DIAGNOSIS — G4733 Obstructive sleep apnea (adult) (pediatric): Secondary | ICD-10-CM

## 2011-12-08 DIAGNOSIS — J449 Chronic obstructive pulmonary disease, unspecified: Secondary | ICD-10-CM

## 2011-12-08 DIAGNOSIS — J9610 Chronic respiratory failure, unspecified whether with hypoxia or hypercapnia: Secondary | ICD-10-CM

## 2011-12-08 NOTE — Assessment & Plan Note (Signed)
The patient has been compliant with his bronchodilator regimen, and has excellent air flow on exam with no wheezing.  This is just a small piece of his overall complaint of dyspnea.

## 2011-12-08 NOTE — Assessment & Plan Note (Signed)
The patient is wearing his CPAP compliantly, as well as his nocturnal oxygen.  I think it is worth checking his oxygen level at night to make sure that we are keeping it in an acceptable range.

## 2011-12-08 NOTE — Patient Instructions (Addendum)
Will have [facility] check your oxygen level overnight while on oxygen and cpap Will have [facility] show you liquid oxygen to see if it will work better for you. Work on weight loss any way you can.   followup with me in 6mos

## 2011-12-08 NOTE — Progress Notes (Signed)
  Subjective:    Patient ID: Bruce Taylor, male    DOB: [birth year], 71 y.o.   MRN: [id number]  HPI The patient comes in today for followup of his multifactorial chronic respiratory failure.  He has known moderate COPD, ischemic cardiomyopathy with heart failure, and also probable obesity hypoventilation syndrome.  He continues to have significant dyspnea on exertion, however has not lost significant weight nor has he been able to stay on a conditioning program.  He denies any chest congestion, cough, or significant mucus production.  He is staying compliant on his bronchodilator regimen.  He is also staying on CPAP with oxygen at night while sleeping.   Review of Systems  Constitutional: Negative.  Negative for fever and unexpected weight change.  HENT: Positive for congestion, rhinorrhea, sneezing, postnasal drip and sinus pressure. Negative for ear pain, sore throat, trouble swallowing and dental problem.   Eyes: Negative.  Negative for redness and itching.  Respiratory: Positive for cough and shortness of breath. Negative for chest tightness and wheezing.   Cardiovascular: Negative.  Negative for palpitations and leg swelling.  Gastrointestinal: Negative.  Negative for nausea and vomiting.  Genitourinary: Negative.  Negative for dysuria.  Musculoskeletal: Negative.  Negative for joint swelling.  Skin: Negative.  Negative for rash.  Neurological: Negative.  Negative for headaches.  Hematological: Bruises/bleeds easily.  Psychiatric/Behavioral: Negative.  Negative for dysphoric mood. The patient is not nervous/anxious.        Objective:   Physical Exam Morbidly obese male in no acute distress Nose without purulence or discharge noted Chest with minimal basilar crackles, excellent air flow, no wheezes or rhonchi Cardiac exam with irregular rate and rhythm Lower extremities with 2+ edema bilaterally, no cyanosis Alert and oriented, moves all 4 extremities.       Assessment &  Plan:

## 2011-12-15 ENCOUNTER — Telehealth: Payer: Self-pay | Admitting: Pulmonary Disease

## 2011-12-15 NOTE — Telephone Encounter (Signed)
I spoke with pt and he states the symbicort is too expensive and is wondering if [facility] would change him over to budesonide for nebulizer/ he states he had this before and it helped him. If [facility] will change this for him he would like rx sent to [facility]. Please advise [facility] thanks

## 2011-12-16 ENCOUNTER — Telehealth: Payer: Self-pay | Admitting: Pulmonary Disease

## 2011-12-16 MED ORDER — BUDESONIDE 0.5 MG/2ML IN SUSP: 0.5000 mg | Freq: Two times a day (BID) | RESPIRATORY_TRACT | Status: DC

## 2011-12-16 MED ORDER — ALBUTEROL SULFATE (2.5 MG/3ML) 0.083% IN NEBU: 2.5000 mg | INHALATION_SOLUTION | Freq: Four times a day (QID) | RESPIRATORY_TRACT | Status: DC

## 2011-12-16 NOTE — Telephone Encounter (Signed)
Please let pt know that his oxygen level at night on cpap and oxygen is adequate.  Only 3 min the entire night below 88%.  No change in treatment.

## 2011-12-16 NOTE — Telephone Encounter (Signed)
Rx were printed and faxed to [facility].  Pt's spouse aware and states nothing further needed.

## 2011-12-16 NOTE — Telephone Encounter (Signed)
I spoke with pt and he states he is willing to do that and his wife will make sure he does this as directed to. Please advise KC thanks

## 2011-12-16 NOTE — Telephone Encounter (Signed)
Ok to order albuterol 2.5 mg by [facility] qid with budesonide 0.5mg  bid added.  Stop symbicort.

## 2011-12-16 NOTE — Telephone Encounter (Signed)
He would need to be on albuterol nebs 4 times a day no matter what, then add budesonide in 2 of the treatments.  Is he willing to take per treatment for 4 treatments a day?

## 2011-12-16 NOTE — Telephone Encounter (Signed)
Per Mindy, advised Mandy of [facility] that we are awaiting on an answer from [facility].  Antionette Fairy

## 2011-12-17 NOTE — Telephone Encounter (Signed)
Pt aware of results. Edwards Mckelvie, CMA  

## 2011-12-24 ENCOUNTER — Telehealth: Payer: Self-pay | Admitting: Pulmonary Disease

## 2011-12-24 NOTE — Telephone Encounter (Signed)
I spoke with [facility] and states they are sending over a new cmn on pt. Advised once received will fax back

## 2011-12-24 NOTE — Telephone Encounter (Signed)
I spoke with Darl Pikes from [facility] and she states they received rx for albuterol and pulmicort for the pt, but  somehow [facility] sent brovana, albuterol and ipratropium to the pt. She states she spoke with [facility] and the pt and advised that these are the incorrect and that the correct meds are albuterol and budesonide. Darl Pikes states that they are dispensing the correct meds to the pt and are picking up the incorrect ones and delivering back to [facility]. She states she has been in contact with [facility] and the pt about this. She states she wanted to let us know about this mix-up.   I spoke with Alida and she has the corrected CMN for [facility] to sign. She called [facility] and asked them to fax the one that they state was incorrect and was signed by [facility] so we can have that for our records. They advised Alida they will re-fax this. Nothing further needed.  Carron Curie, CMA

## 2011-12-24 NOTE — Telephone Encounter (Signed)
I remember signing this pt's cmn, and I am almost positive it had the right medications on it.  Will await the old cmn for verification.

## 2011-12-27 NOTE — Telephone Encounter (Signed)
lori can you f/u on this

## 2011-12-27 NOTE — Telephone Encounter (Signed)
Per alida we have corrected cmn and this is in kc's folder to sign, the correct meds were sent to the pt and he has what he needs this is just paperwork to be signed at this point

## 2012-01-10 ENCOUNTER — Emergency Department (HOSPITAL_COMMUNITY)
Admission: EM | Admit: 2012-01-10 | Discharge: 2012-01-11 | Disposition: A | Discharge status: Home / Self Care | Payer: Medicare Other | Attending: Emergency Medicine | Admitting: Emergency Medicine

## 2012-01-10 ENCOUNTER — Emergency Department (HOSPITAL_COMMUNITY): Payer: Medicare Other

## 2012-01-10 ENCOUNTER — Encounter (HOSPITAL_COMMUNITY): Payer: Self-pay | Admitting: Emergency Medicine

## 2012-01-10 DIAGNOSIS — Z79899 Other long term (current) drug therapy: Secondary | ICD-10-CM | POA: Insufficient documentation

## 2012-01-10 DIAGNOSIS — Z794 Long term (current) use of insulin: Secondary | ICD-10-CM | POA: Insufficient documentation

## 2012-01-10 DIAGNOSIS — J4489 Other specified chronic obstructive pulmonary disease: Secondary | ICD-10-CM | POA: Insufficient documentation

## 2012-01-10 DIAGNOSIS — Z9981 Dependence on supplemental oxygen: Secondary | ICD-10-CM | POA: Insufficient documentation

## 2012-01-10 DIAGNOSIS — J449 Chronic obstructive pulmonary disease, unspecified: Secondary | ICD-10-CM | POA: Insufficient documentation

## 2012-01-10 DIAGNOSIS — Z9861 Coronary angioplasty status: Secondary | ICD-10-CM | POA: Insufficient documentation

## 2012-01-10 DIAGNOSIS — S51811A Laceration without foreign body of right forearm, initial encounter: Secondary | ICD-10-CM

## 2012-01-10 DIAGNOSIS — G4733 Obstructive sleep apnea (adult) (pediatric): Secondary | ICD-10-CM | POA: Insufficient documentation

## 2012-01-10 DIAGNOSIS — Z4801 Encounter for change or removal of surgical wound dressing: Secondary | ICD-10-CM | POA: Insufficient documentation

## 2012-01-10 DIAGNOSIS — E785 Hyperlipidemia, unspecified: Secondary | ICD-10-CM | POA: Insufficient documentation

## 2012-01-10 DIAGNOSIS — I4891 Unspecified atrial fibrillation: Secondary | ICD-10-CM | POA: Insufficient documentation

## 2012-01-10 DIAGNOSIS — I252 Old myocardial infarction: Secondary | ICD-10-CM | POA: Insufficient documentation

## 2012-01-10 DIAGNOSIS — S51809A Unspecified open wound of unspecified forearm, initial encounter: Secondary | ICD-10-CM | POA: Insufficient documentation

## 2012-01-10 DIAGNOSIS — I10 Essential (primary) hypertension: Secondary | ICD-10-CM | POA: Insufficient documentation

## 2012-01-10 DIAGNOSIS — Z87891 Personal history of nicotine dependence: Secondary | ICD-10-CM | POA: Insufficient documentation

## 2012-01-10 DIAGNOSIS — W19XXXA Unspecified fall, initial encounter: Secondary | ICD-10-CM | POA: Insufficient documentation

## 2012-01-10 DIAGNOSIS — I2510 Atherosclerotic heart disease of native coronary artery without angina pectoris: Secondary | ICD-10-CM | POA: Insufficient documentation

## 2012-01-10 DIAGNOSIS — R58 Hemorrhage, not elsewhere classified: Secondary | ICD-10-CM

## 2012-01-10 DIAGNOSIS — W269XXA Contact with unspecified sharp object(s), initial encounter: Secondary | ICD-10-CM | POA: Insufficient documentation

## 2012-01-10 DIAGNOSIS — E119 Type 2 diabetes mellitus without complications: Secondary | ICD-10-CM | POA: Insufficient documentation

## 2012-01-10 LAB — PROTIME-INR: INR: 2.54 — ABNORMAL HIGH (ref 0.00–1.49)

## 2012-01-10 LAB — DIFFERENTIAL
Basophils Absolute: 0 10*3/uL (ref 0.0–0.1)
Basophils Relative: 0 % (ref 0–1)
Eosinophils Absolute: 0.1 10*3/uL (ref 0.0–0.7)
Neutrophils Relative %: 67 % (ref 43–77)

## 2012-01-10 LAB — BASIC METABOLIC PANEL
GFR calc Af Amer: 55 mL/min — ABNORMAL LOW (ref >90)
GFR calc non Af Amer: 47 mL/min — ABNORMAL LOW (ref >90)
Potassium: 4.4 mEq/L (ref 3.5–5.1)
Sodium: 134 mEq/L — ABNORMAL LOW (ref 135–145)

## 2012-01-10 LAB — CBC
MCH: 29.4 pg (ref 26.0–34.0)
MCHC: 32.7 g/dL (ref 30.0–36.0)
Platelets: 182 10*3/uL (ref 150–400)
RBC: 4.25 MIL/uL (ref 4.22–5.81)
RDW: 15 % (ref 11.5–15.5)

## 2012-01-10 NOTE — ED Provider Notes (Signed)
History     CSN: [id number]  Arrival date & time 01/10/12  2034   First MD Initiated Contact with Patient 01/10/12 2251      Chief Complaint  Patient presents with  . Extremity Laceration    (Consider location/radiation/quality/duration/timing/severity/associated sxs/prior treatment) HPI Comments: Was sitting outside for extended time and when got up from sitting fell - is supposed to walk with a walker but wasn't using it.  Dr. Clarene Duke is working him up for frequent falls.  He landed on R arm - no head injury and no neck or back pain.  He had a laceration to the R arm which has been bleeding since that time.  Has been using compressions and dressings but still having some bleeding - saw Dr. Clarene Duke today - dressed the wound.  INR was 3.0 today at clinic.  Sx are persistent.  Nothing make sbetter or worse =- got Tdap today.  The history is provided by the spouse and the patient.    Past Medical History  Diagnosis Date  . Hyperlipidemia   . Hypertension   . OSA (obstructive sleep apnea)   . Diabetes mellitus   . Complete heart block     s/p PPM  . Atrial fibrillation     on chronic coumadin  . Ischemic cardiomyopathy     EF 45% by echo 2010  . COPD (chronic obstructive pulmonary disease)   . On home O2     "24/7"  . Renal insufficiency   . Tremor   . Angina pectoris   . Obstructive sleep apnea on CPAP   . Obesity, morbid 06/15/2011  . Coronary artery disease   . Shortness of breath   . Complication of anesthesia     "sometimes I wake up during OR"  . Myocardial infarct ? 1995  . Blood transfusion   . Anemia   . Hematuria   . Ulcer     "don't know what kind; it was years ago"  . Stress     "wife's been dx'd w/stage IV bone cancer"  . Recurrent coronary arteriosclerosis following PTCA 06/17/2011    Past Surgical History  Procedure Date  . Coronary stent placement     x 8  . Back surgery     x3  . Coronary angioplasty   . Coronary stent placement 4/12    Has  stents in LAD, RCA,LCX  . Coronary angioplasty with stent placement     "? total of 9 stents over the years; 6 at one time"  . Ptca   . Tonsillectomy   . Appendectomy   . Insert / replace / remove pacemaker 07/2008  . Cataract extraction, bilateral     Family History  Problem Relation Age of Onset  . Heart disease Father   . Heart disease Brother   . Uterine cancer Mother   . Diabetes Sister     History  Substance Use Topics  . Smoking status: Former Smoker -- 2.0 packs/day for 52 years    Types: Cigarettes    Quit date: 07/26/2006  . Smokeless tobacco: Never Used  . Alcohol Use: Yes     "did drink at one time for 5 years; stopped ~ 1980"      Review of Systems  All other systems reviewed and are negative.    Allergies  Chlorhexidine gluconate and Penicillins  Home Medications   Current Outpatient Rx  Name Route Sig Dispense Refill  . ALBUTEROL SULFATE (2.5 MG/3ML) 0.083% IN NEBU Nebulization  Take 2.5 mg by nebulization 2 (two) times daily.    . ASPIRIN EC 81 MG PO TBEC Oral Take 81 mg by mouth daily.      . BUDESONIDE 0.5 MG/2ML IN SUSP Nebulization Take 0.5 mg by nebulization 2 (two) times daily.    Marland Kitchen CARVEDILOL 6.25 MG PO TABS Oral Take by mouth 2 (two) times daily with a meal. Take 1 1/2 tabs each morning and 1 tab each evening    . CITALOPRAM HYDROBROMIDE 40 MG PO TABS Oral Take 40 mg by mouth daily.      . FUROSEMIDE 40 MG PO TABS Oral Take 40 mg by mouth daily.      . INSULIN ASPART PROT & ASPART (70-30) 100 UNIT/ML [place] SUSP Subcutaneous Inject 52-54 Units into the skin 2 (two) times daily with a meal.     . ISOSORBIDE MONONITRATE ER 30 MG PO TB24 Oral Take 30 mg by mouth daily.      Marland Kitchen LISINOPRIL 20 MG PO TABS Oral Take 10 mg by mouth daily.      Marland Kitchen MAGNESIUM 250 MG PO TABS Oral Take 1 tablet by mouth daily.    Marland Kitchen NITROGLYCERIN 0.4 MG SL SUBL Sublingual Place 0.4 mg under the tongue every 5 (five) minutes as needed. For chest pain    . OXYBUTYNIN CHLORIDE 5 MG  PO TABS Oral Take 5 mg by mouth 2 (two) times daily.      Marland Kitchen PANTOPRAZOLE SODIUM 40 MG PO TBEC Oral Take 40 mg by mouth daily.      Marland Kitchen PRIMIDONE 50 MG PO TABS Oral Take 50-100 mg by mouth 2 (two) times daily. 1 tab in the morning and 2 tabs at night     . SIMVASTATIN 80 MG PO TABS Oral Take 40 mg by mouth Daily. Take 1/2 tab at night    . SPIRONOLACTONE 25 MG PO TABS Oral Take 25 mg by mouth daily.      . TOPIRAMATE 50 MG PO TABS Oral Take 50 mg by mouth 2 (two) times daily.    . WARFARIN SODIUM 10 MG PO TABS Oral Take 10 mg by mouth daily.    [name] SODIUM 5 MG PO TABS Oral Take 2.5 mg by mouth every Monday, Wednesday, and Friday. Takes 2.5 mg along with 10 mg to equal 12.5 mg    . ALBUTEROL SULFATE HFA 108 (90 BASE) MCG/ACT IN AERS Inhalation Inhale 1-2 puffs into the lungs every 6 (six) hours as needed. For shortness of breath      BP 109/49  Pulse 80  Temp 97.8 F (36.6 C) (Oral)  Resp 18  SpO2 100%  Physical Exam  Nursing note and vitals reviewed. Constitutional: He appears well-developed and well-nourished. No distress.  HENT:  Head: Normocephalic and atraumatic.  Mouth/Throat: Oropharynx is clear and moist. No oropharyngeal exudate.  Eyes: Conjunctivae and EOM are normal. Pupils are equal, round, and reactive to light. Right eye exhibits no discharge. Left eye exhibits no discharge. No scleral icterus.  Neck: Normal range of motion. Neck supple. No JVD present. No thyromegaly present.  Cardiovascular: Regular rhythm, normal heart sounds and intact distal pulses.  Exam reveals no gallop and no friction rub.   No murmur heard.      I'll bradycardia at 58 beats per minute on my exam  Pulmonary/Chest: Effort normal and breath sounds normal. No respiratory distress. He has no wheezes. He has no rales.  Abdominal: Soft. Bowel sounds are normal. He exhibits no  distension and no mass. There is no tenderness.       Obese nontender  Musculoskeletal: Normal range of motion. He exhibits  tenderness ( L. tenderness over the right extensor aspect of the proximal forearm, associated with wound which has mild loose, no repairable lacerations, skin tear present). He exhibits no edema.  Lymphadenopathy:    He has no cervical adenopathy.  Neurological: He is alert. Coordination normal.  Skin: Skin is warm and dry. No rash noted. No erythema.  Psychiatric: He has a normal mood and affect. His behavior is normal.    ED Course  Procedures (including critical care time)  Labs Reviewed  CBC - Abnormal; Notable for the following:    Hemoglobin 12.5 (*)     HCT 38.2 (*)     All other components within normal limits  BASIC METABOLIC PANEL - Abnormal; Notable for the following:    Sodium 134 (*)     Glucose, Bld 182 (*)     BUN 26 (*)     Creatinine, Ser 1.43 (*)     GFR calc non Af Amer 47 (*)     GFR calc Af Amer 55 (*)     All other components within normal limits  PROTIME-INR - Abnormal; Notable for the following:    Prothrombin Time 27.8 (*)     INR 2.54 (*)     All other components within normal limits  DIFFERENTIAL   Dg Elbow 2 Views Right  01/10/2012  *RADIOLOGY REPORT*  Clinical Data: Status post fall; laceration to the right elbow.  RIGHT ELBOW - 2 VIEW  Comparison: None.  Findings: There is no evidence of fracture or dislocation.  The visualized joint spaces are preserved.  No significant joint effusion is identified.  Scattered vascular calcifications are noted.  The known soft tissue laceration is difficult to fully characterize, though an overlying bandage is seen.  IMPRESSION:  1.  No evidence of fracture or dislocation. 2.  Scattered vascular calcifications seen.  Original Report Authenticated By: Tonia Ghent, M.D.     1. Bleeding   2. 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  Patient has had no recurrent bleeding after quick clot applied, has been able to get into a wheelchair for discharge, wife states she is comfortable taking care of him, instructions on wound care given, understanding expressed.      Vida Roller, MD 01/11/12 [phone]

## 2012-01-10 NOTE — ED Notes (Signed)
Patient transported to X-ray 

## 2012-01-10 NOTE — ED Notes (Signed)
No bleed through noted

## 2012-01-10 NOTE — ED Notes (Signed)
Pt with a approximately 12 cm skin tear to right inferior forearm. continous oozing noted. Pressure dressing applied.

## 2012-01-10 NOTE — ED Notes (Signed)
PT. FELL LAST Saturday ( LOST BALANCE )  SUSTAINED SKIN TEAR AT RIGHT ELBOW , SEEN BY PCP TODAY APPLIED DRESSING AT CLINIC , PRESENTS WITH PERSISTENT BLEEDING AT RIGHT ELBOW ,  PRESSURE DRESSING APPLIED AT TRIAGE , STATES TAKING COUMADIN .

## 2012-01-11 NOTE — Discharge Instructions (Signed)
Turner the hospital for severe or worsening bleeding, allow a small amount of bleeding, redress the wound with a pressure dressing should it start to bleed. Do not take your dressing on for 48 hours, after which time he should use warm soaks to help loosen up the tissue before removing the dressing.

## 2012-01-11 NOTE — ED Notes (Signed)
No bleeding noted

## 2012-01-11 NOTE — ED Notes (Signed)
Discharged via teach back. Dressing supplies given to go per Dr Rondel Baton order. Vaseline gauze, coban, Kerlix, 4x4 tub. WC to car.

## 2012-01-11 NOTE — ED Notes (Signed)
Pt moved to wc. No bleeding noted. Dressing reinforced with coban.

## 2012-01-12 ENCOUNTER — Ambulatory Visit: Payer: Medicare Other | Admitting: Pulmonary Disease

## 2012-05-09 DIAGNOSIS — I739 Peripheral vascular disease, unspecified: Secondary | ICD-10-CM

## 2012-05-09 HISTORY — DX: Peripheral vascular disease, unspecified: I73.9

## 2012-06-09 ENCOUNTER — Ambulatory Visit (INDEPENDENT_AMBULATORY_CARE_PROVIDER_SITE_OTHER): Payer: Medicare Other | Admitting: Pulmonary Disease

## 2012-06-09 ENCOUNTER — Encounter: Payer: Self-pay | Admitting: Pulmonary Disease

## 2012-06-09 VITALS — BP 134/84 | HR 70 | Temp 97.7°F | Ht 71.5 in | Wt 307.8 lb

## 2012-06-09 DIAGNOSIS — J9610 Chronic respiratory failure, unspecified whether with hypoxia or hypercapnia: Secondary | ICD-10-CM

## 2012-06-09 DIAGNOSIS — J449 Chronic obstructive pulmonary disease, unspecified: Secondary | ICD-10-CM

## 2012-06-09 DIAGNOSIS — G4733 Obstructive sleep apnea (adult) (pediatric): Secondary | ICD-10-CM

## 2012-06-09 NOTE — Assessment & Plan Note (Signed)
I have asked the patient to try and take his albuterol nebulizer treatments 4 times a day if possible.  Again, this is only a small contributor to his overall symptomatology.

## 2012-06-09 NOTE — Patient Instructions (Addendum)
Try and take albuterol treatments 4 times a day if possible.  Stay on budesonide treatments twice a day.  Really work on weight loss followup with me in 6mos

## 2012-06-09 NOTE — Progress Notes (Signed)
  Subjective:    Patient ID: Bruce Taylor, male    DOB: November 19, 1940, 71 y.o.   MRN: [id number]  HPI The patient comes in today for followup of his chronic respiratory failure secondary to cardiomyopathy, morbid obesity, and mild COPD.  He is staying on his CPAP compliantly, and is having no issues with tolerance.  We have checked overnight oximetry while on CPAP, and he has adequate saturations while sleeping.  He is using his neb treatments only twice a day, and I encouraged him to try and use them 4 times a day, although I really do not believe that COPD is a major issue for him.  He continues to have significant dyspnea on exertion related to his cardiomyopathy and morbid obesity, and does not always wear his oxygen with exertional activities.   Review of Systems  Constitutional: Negative for fever and unexpected weight change.  HENT: Positive for congestion and rhinorrhea. Negative for ear pain, nosebleeds, sore throat, sneezing, trouble swallowing, dental problem, postnasal drip and sinus pressure.   Eyes: Negative for redness and itching.  Respiratory: Positive for cough, shortness of breath and wheezing. Negative for chest tightness.   Cardiovascular: Negative for palpitations and leg swelling.  Gastrointestinal: Negative for nausea and vomiting.  Genitourinary: Negative for dysuria.  Musculoskeletal: Negative for joint swelling.  Skin: Negative for rash.  Neurological: Negative for headaches.  Hematological: Bruises/bleeds easily.  Psychiatric/Behavioral: Positive for dysphoric mood. The patient is nervous/anxious.        Objective:   Physical Exam Morbidly obese male in no acute distress Nose without purulent discharge noted No skin breakdown or pressure necrosis from the CPAP mask Chest with mildly decreased breath sounds, no wheezes or rhonchi Cardiac exam with regular rate and rhythm Lower extremities with 1+ edema, no cyanosis Alert and oriented, moves all 4  extremities.      Assessment & Plan:

## 2012-06-09 NOTE — Assessment & Plan Note (Signed)
The patient is to continue on CPAP, and I have encouraged him to keep up with mask changes and supplies.  I have also stressed to him the importance of aggressive weight loss.

## 2012-07-13 ENCOUNTER — Emergency Department (HOSPITAL_COMMUNITY): Payer: Medicare Other

## 2012-07-13 ENCOUNTER — Inpatient Hospital Stay (HOSPITAL_COMMUNITY)
Admission: EM | Admit: 2012-07-13 | Discharge: 2012-07-18 | DRG: 392 | Disposition: A | Discharge status: Skilled Nursing Facility | Payer: Medicare Other | Attending: Internal Medicine | Admitting: Internal Medicine

## 2012-07-13 ENCOUNTER — Encounter (HOSPITAL_COMMUNITY): Payer: Self-pay | Admitting: General Practice

## 2012-07-13 DIAGNOSIS — I2589 Other forms of chronic ischemic heart disease: Secondary | ICD-10-CM | POA: Diagnosis present

## 2012-07-13 DIAGNOSIS — E871 Hypo-osmolality and hyponatremia: Secondary | ICD-10-CM | POA: Diagnosis not present

## 2012-07-13 DIAGNOSIS — Z79899 Other long term (current) drug therapy: Secondary | ICD-10-CM

## 2012-07-13 DIAGNOSIS — J449 Chronic obstructive pulmonary disease, unspecified: Secondary | ICD-10-CM

## 2012-07-13 DIAGNOSIS — Z6841 Body Mass Index (BMI) 40.0 and over, adult: Secondary | ICD-10-CM

## 2012-07-13 DIAGNOSIS — I10 Essential (primary) hypertension: Secondary | ICD-10-CM | POA: Diagnosis present

## 2012-07-13 DIAGNOSIS — Z9981 Dependence on supplemental oxygen: Secondary | ICD-10-CM

## 2012-07-13 DIAGNOSIS — R5381 Other malaise: Secondary | ICD-10-CM | POA: Clinically undetermined

## 2012-07-13 DIAGNOSIS — I4891 Unspecified atrial fibrillation: Secondary | ICD-10-CM | POA: Diagnosis present

## 2012-07-13 DIAGNOSIS — I509 Heart failure, unspecified: Secondary | ICD-10-CM | POA: Diagnosis present

## 2012-07-13 DIAGNOSIS — Z7901 Long term (current) use of anticoagulants: Secondary | ICD-10-CM

## 2012-07-13 DIAGNOSIS — E118 Type 2 diabetes mellitus with unspecified complications: Secondary | ICD-10-CM | POA: Diagnosis present

## 2012-07-13 DIAGNOSIS — A088 Other specified intestinal infections: Principal | ICD-10-CM | POA: Diagnosis present

## 2012-07-13 DIAGNOSIS — J4489 Other specified chronic obstructive pulmonary disease: Secondary | ICD-10-CM

## 2012-07-13 DIAGNOSIS — E785 Hyperlipidemia, unspecified: Secondary | ICD-10-CM | POA: Diagnosis present

## 2012-07-13 DIAGNOSIS — I482 Chronic atrial fibrillation, unspecified: Secondary | ICD-10-CM | POA: Diagnosis present

## 2012-07-13 DIAGNOSIS — M722 Plantar fascial fibromatosis: Secondary | ICD-10-CM | POA: Diagnosis not present

## 2012-07-13 DIAGNOSIS — Z794 Long term (current) use of insulin: Secondary | ICD-10-CM

## 2012-07-13 DIAGNOSIS — D649 Anemia, unspecified: Secondary | ICD-10-CM | POA: Diagnosis present

## 2012-07-13 DIAGNOSIS — J9610 Chronic respiratory failure, unspecified whether with hypoxia or hypercapnia: Secondary | ICD-10-CM | POA: Diagnosis present

## 2012-07-13 DIAGNOSIS — R197 Diarrhea, unspecified: Secondary | ICD-10-CM

## 2012-07-13 DIAGNOSIS — K529 Noninfective gastroenteritis and colitis, unspecified: Secondary | ICD-10-CM | POA: Diagnosis present

## 2012-07-13 DIAGNOSIS — Z87891 Personal history of nicotine dependence: Secondary | ICD-10-CM

## 2012-07-13 DIAGNOSIS — G4733 Obstructive sleep apnea (adult) (pediatric): Secondary | ICD-10-CM

## 2012-07-13 DIAGNOSIS — A084 Viral intestinal infection, unspecified: Secondary | ICD-10-CM

## 2012-07-13 DIAGNOSIS — Z7982 Long term (current) use of aspirin: Secondary | ICD-10-CM

## 2012-07-13 DIAGNOSIS — Z950 Presence of cardiac pacemaker: Secondary | ICD-10-CM

## 2012-07-13 DIAGNOSIS — R531 Weakness: Secondary | ICD-10-CM

## 2012-07-13 DIAGNOSIS — I2510 Atherosclerotic heart disease of native coronary artery without angina pectoris: Secondary | ICD-10-CM | POA: Diagnosis present

## 2012-07-13 DIAGNOSIS — E119 Type 2 diabetes mellitus without complications: Secondary | ICD-10-CM | POA: Diagnosis present

## 2012-07-13 DIAGNOSIS — R259 Unspecified abnormal involuntary movements: Secondary | ICD-10-CM | POA: Diagnosis present

## 2012-07-13 DIAGNOSIS — Z9861 Coronary angioplasty status: Secondary | ICD-10-CM

## 2012-07-13 HISTORY — DX: Gastro-esophageal reflux disease without esophagitis: K21.9

## 2012-07-13 HISTORY — DX: Low back pain, unspecified: M54.50

## 2012-07-13 HISTORY — DX: Low back pain: M54.5

## 2012-07-13 HISTORY — DX: Type 2 diabetes mellitus without complications: E11.9

## 2012-07-13 HISTORY — DX: Hematuria, unspecified: R31.9

## 2012-07-13 HISTORY — DX: Presence of cardiac pacemaker: Z95.0

## 2012-07-13 HISTORY — DX: Unspecified osteoarthritis, unspecified site: M19.90

## 2012-07-13 HISTORY — DX: Heart failure, unspecified: I50.9

## 2012-07-13 HISTORY — DX: Gastric ulcer, unspecified as acute or chronic, without hemorrhage or perforation: K25.9

## 2012-07-13 HISTORY — DX: Other chronic pain: G89.29

## 2012-07-13 LAB — URINALYSIS, ROUTINE W REFLEX MICROSCOPIC
Bilirubin Urine: NEGATIVE
Nitrite: NEGATIVE
Specific Gravity, Urine: 1.023 (ref 1.005–1.030)
pH: 6 (ref 5.0–8.0)

## 2012-07-13 LAB — COMPREHENSIVE METABOLIC PANEL
ALT: 11 U/L (ref 0–53)
AST: 19 U/L (ref 0–37)
Albumin: 3.6 g/dL (ref 3.5–5.2)
CO2: 23 mEq/L (ref 19–32)
Calcium: 8.8 mg/dL (ref 8.4–10.5)
Chloride: 100 mEq/L (ref 96–112)
GFR calc non Af Amer: 52 mL/min — ABNORMAL LOW (ref >90)
Sodium: 136 mEq/L (ref 135–145)

## 2012-07-13 LAB — TROPONIN I: Troponin I: <0.3 ng/mL (ref <0.30)

## 2012-07-13 LAB — CBC WITH DIFFERENTIAL/PLATELET
Basophils Absolute: 0 10*3/uL (ref 0.0–0.1)
Basophils Relative: 0 % (ref 0–1)
Eosinophils Relative: 0 % (ref 0–5)
Lymphocytes Relative: 5 % — ABNORMAL LOW (ref 12–46)
Neutro Abs: 10.8 10*3/uL — ABNORMAL HIGH (ref 1.7–7.7)
Platelets: 170 10*3/uL (ref 150–400)
RDW: 15.3 % (ref 11.5–15.5)
WBC: 11.9 10*3/uL — ABNORMAL HIGH (ref 4.0–10.5)

## 2012-07-13 LAB — URINE MICROSCOPIC-ADD ON

## 2012-07-13 LAB — POCT I-STAT TROPONIN I
Troponin i, poc: 0.01 ng/mL (ref 0.00–0.08)
Troponin i, poc: 0.04 ng/mL (ref 0.00–0.08)

## 2012-07-13 LAB — PROTIME-INR
INR: 2.2 — ABNORMAL HIGH (ref 0.00–1.49)
Prothrombin Time: 23.5 seconds — ABNORMAL HIGH (ref 11.6–15.2)

## 2012-07-13 MED ORDER — WARFARIN SODIUM 5 MG PO TABS
5.0000 mg | ORAL_TABLET | ORAL | Status: DC
  Administered 2012-07-14 – 2012-07-17 (×2): 5 mg via ORAL
  Filled 2012-07-13 (×2): qty 1

## 2012-07-13 MED ORDER — MAGNESIUM 250 MG PO TABS: 250.0000 mg | ORAL_TABLET | Freq: Every day | ORAL | Status: DC

## 2012-07-13 MED ORDER — BUDESONIDE 0.5 MG/2ML IN SUSP
0.5000 mg | Freq: Two times a day (BID) | RESPIRATORY_TRACT | Status: DC
  Administered 2012-07-14 – 2012-07-18 (×9): 0.5 mg via RESPIRATORY_TRACT
  Filled 2012-07-13 (×11): qty 2

## 2012-07-13 MED ORDER — CARVEDILOL 6.25 MG PO TABS
9.3750 mg | ORAL_TABLET | Freq: Every day | ORAL | Status: DC
  Administered 2012-07-14: 9.375 mg via ORAL
  Filled 2012-07-13 (×3): qty 1

## 2012-07-13 MED ORDER — ALBUTEROL SULFATE HFA 108 (90 BASE) MCG/ACT IN AERS: 1.0000 | INHALATION_SPRAY | Freq: Four times a day (QID) | RESPIRATORY_TRACT | Status: DC | PRN

## 2012-07-13 MED ORDER — PANTOPRAZOLE SODIUM 40 MG PO TBEC
40.0000 mg | DELAYED_RELEASE_TABLET | Freq: Every day | ORAL | Status: DC
  Administered 2012-07-14 – 2012-07-18 (×5): 40 mg via ORAL
  Filled 2012-07-13 (×4): qty 1

## 2012-07-13 MED ORDER — ISOSORBIDE MONONITRATE ER 30 MG PO TB24
30.0000 mg | ORAL_TABLET | Freq: Every day | ORAL | Status: DC
  Administered 2012-07-13 – 2012-07-18 (×6): 30 mg via ORAL
  Filled 2012-07-13 (×6): qty 1

## 2012-07-13 MED ORDER — SPIRONOLACTONE 25 MG PO TABS
25.0000 mg | ORAL_TABLET | Freq: Every day | ORAL | Status: DC
  Administered 2012-07-13 – 2012-07-18 (×6): 25 mg via ORAL
  Filled 2012-07-13 (×6): qty 1

## 2012-07-13 MED ORDER — MAGNESIUM OXIDE 400 (241.3 MG) MG PO TABS
200.0000 mg | ORAL_TABLET | Freq: Every day | ORAL | Status: DC
  Administered 2012-07-14 – 2012-07-18 (×5): 200 mg via ORAL
  Filled 2012-07-13 (×5): qty 0.5

## 2012-07-13 MED ORDER — PRIMIDONE 50 MG PO TABS: 50.0000 mg | ORAL_TABLET | Freq: Two times a day (BID) | ORAL | Status: DC

## 2012-07-13 MED ORDER — OXYBUTYNIN CHLORIDE 5 MG PO TABS
5.0000 mg | ORAL_TABLET | Freq: Two times a day (BID) | ORAL | Status: DC
  Administered 2012-07-13 – 2012-07-18 (×10): 5 mg via ORAL
  Filled 2012-07-13 (×11): qty 1

## 2012-07-13 MED ORDER — NITROGLYCERIN 0.4 MG SL SUBL
0.4000 mg | SUBLINGUAL_TABLET | Freq: Once | SUBLINGUAL | Status: AC
  Administered 2012-07-13: 0.4 mg via SUBLINGUAL

## 2012-07-13 MED ORDER — INSULIN ASPART PROT & ASPART (70-30 MIX) 100 UNIT/ML ~~LOC~~ SUSP
50.0000 [IU] | Freq: Two times a day (BID) | SUBCUTANEOUS | Status: DC
  Administered 2012-07-14 (×2): 50 [IU] via SUBCUTANEOUS
  Filled 2012-07-13: qty 3

## 2012-07-13 MED ORDER — SODIUM CHLORIDE 0.9 % IV SOLN
INTRAVENOUS | Status: AC
  Administered 2012-07-13: 20:00:00 via INTRAVENOUS

## 2012-07-13 MED ORDER — CARVEDILOL 6.25 MG PO TABS
6.2500 mg | ORAL_TABLET | Freq: Every day | ORAL | Status: DC
  Administered 2012-07-13 – 2012-07-14 (×2): 6.25 mg via ORAL
  Filled 2012-07-13 (×3): qty 1

## 2012-07-13 MED ORDER — WARFARIN SODIUM 5 MG PO TABS: 5.0000 mg | ORAL_TABLET | ORAL | Status: DC

## 2012-07-13 MED ORDER — WARFARIN SODIUM 10 MG PO TABS
10.0000 mg | ORAL_TABLET | ORAL | Status: DC
  Administered 2012-07-13 – 2012-07-18 (×4): 10 mg via ORAL
  Filled 2012-07-13 (×4): qty 1

## 2012-07-13 MED ORDER — FUROSEMIDE 40 MG PO TABS
40.0000 mg | ORAL_TABLET | Freq: Every day | ORAL | Status: DC
  Administered 2012-07-14: 40 mg via ORAL
  Filled 2012-07-13: qty 1

## 2012-07-13 MED ORDER — WARFARIN SODIUM 5 MG PO TABS
5.0000 mg | ORAL_TABLET | Freq: Every day | ORAL | Status: DC
  Filled 2012-07-13: qty 2

## 2012-07-13 MED ORDER — CARVEDILOL 6.25 MG PO TABS: 6.2500 mg | ORAL_TABLET | Freq: Two times a day (BID) | ORAL | Status: DC

## 2012-07-13 MED ORDER — CITALOPRAM HYDROBROMIDE 40 MG PO TABS
40.0000 mg | ORAL_TABLET | Freq: Every day | ORAL | Status: DC
  Administered 2012-07-14 – 2012-07-18 (×5): 40 mg via ORAL
  Filled 2012-07-13 (×5): qty 1

## 2012-07-13 MED ORDER — ACETAMINOPHEN 325 MG PO TABS
650.0000 mg | ORAL_TABLET | Freq: Four times a day (QID) | ORAL | Status: DC | PRN
  Administered 2012-07-13 – 2012-07-16 (×3): 650 mg via ORAL
  Filled 2012-07-13 (×3): qty 2

## 2012-07-13 MED ORDER — ATORVASTATIN CALCIUM 40 MG PO TABS
40.0000 mg | ORAL_TABLET | Freq: Every day | ORAL | Status: DC
  Administered 2012-07-13 – 2012-07-18 (×6): 40 mg via ORAL
  Filled 2012-07-13 (×6): qty 1

## 2012-07-13 MED ORDER — ASPIRIN EC 81 MG PO TBEC
81.0000 mg | DELAYED_RELEASE_TABLET | Freq: Every day | ORAL | Status: DC
  Administered 2012-07-13 – 2012-07-18 (×6): 81 mg via ORAL
  Filled 2012-07-13 (×6): qty 1

## 2012-07-13 MED ORDER — NITROGLYCERIN 0.4 MG SL SUBL: 0.4000 mg | SUBLINGUAL_TABLET | SUBLINGUAL | Status: DC | PRN

## 2012-07-13 MED ORDER — PRIMIDONE 50 MG PO TABS
50.0000 mg | ORAL_TABLET | Freq: Every day | ORAL | Status: DC
  Administered 2012-07-14 – 2012-07-18 (×5): 50 mg via ORAL
  Filled 2012-07-13 (×5): qty 1

## 2012-07-13 MED ORDER — WARFARIN SODIUM 10 MG PO TABS
10.0000 mg | ORAL_TABLET | ORAL | Status: DC
  Filled 2012-07-13: qty 1

## 2012-07-13 MED ORDER — ENOXAPARIN SODIUM 40 MG/0.4ML ~~LOC~~ SOLN
40.0000 mg | SUBCUTANEOUS | Status: DC
  Filled 2012-07-13: qty 0.4

## 2012-07-13 MED ORDER — WARFARIN - PHYSICIAN DOSING INPATIENT: Freq: Every day | Status: DC

## 2012-07-13 MED ORDER — ONDANSETRON HCL 4 MG/2ML IJ SOLN
4.0000 mg | Freq: Four times a day (QID) | INTRAMUSCULAR | Status: DC | PRN
  Administered 2012-07-14: 4 mg via INTRAVENOUS
  Filled 2012-07-13: qty 2

## 2012-07-13 MED ORDER — PRIMIDONE 50 MG PO TABS
100.0000 mg | ORAL_TABLET | Freq: Every day | ORAL | Status: DC
  Administered 2012-07-13 – 2012-07-17 (×5): 100 mg via ORAL
  Filled 2012-07-13 (×6): qty 2

## 2012-07-13 MED ORDER — ACETAMINOPHEN 650 MG RE SUPP: 650.0000 mg | Freq: Four times a day (QID) | RECTAL | Status: DC | PRN

## 2012-07-13 MED ORDER — TRAZODONE HCL 50 MG PO TABS
50.0000 mg | ORAL_TABLET | Freq: Every day | ORAL | Status: DC
  Administered 2012-07-13: 50 mg via ORAL
  Filled 2012-07-13 (×2): qty 1

## 2012-07-13 MED ORDER — ALBUTEROL SULFATE (5 MG/ML) 0.5% IN NEBU
2.5000 mg | INHALATION_SOLUTION | Freq: Two times a day (BID) | RESPIRATORY_TRACT | Status: DC
  Administered 2012-07-13 – 2012-07-18 (×10): 2.5 mg via RESPIRATORY_TRACT
  Filled 2012-07-13 (×10): qty 0.5

## 2012-07-13 NOTE — ED Provider Notes (Signed)
History     CSN: [id number]  Arrival date & time 07/13/12  1010   First MD Initiated Contact with Patient 07/13/12 1016      Chief Complaint  Patient presents with  . Extremity Weakness  . Diarrhea    (Consider location/radiation/quality/duration/timing/severity/associated sxs/prior treatment) HPI Comments: Patient with history of multiple medical issues including congestive heart failure, COPD, obesity, 9 previous cardiac stents, pacemaker 2/2 complete heart block, coumadin use -- presents today with subjective fever, chills, confusion, generalized weakness. Family states that the patient was in his usual state of health until earlier this morning. Patient had an episode of diarrhea in his bed and felt warm per the spouse. Patient was confused as to what time it was. He was otherwise oriented. Patient was trying to walk and fell due to weakness in his legs. Patient states that he feels weak in both legs. He slid to the floor and sustained an abrasion on his left shin. Patient did not hit his head. EMS was called. Patient lied on the floor for approximately 30 minutes. Patient states he is not currently having chest pain but did have some chest pain this morning. No shortness of breath. Onset acute. Course is constant. Nothing makes symptoms better or worse. Patient did not have his morning medications.  The history is provided by the patient, medical records and the spouse.    Past Medical History  Diagnosis Date  . Hyperlipidemia   . Hypertension   . OSA (obstructive sleep apnea)   . Diabetes mellitus   . Complete heart block     s/p PPM  . Atrial fibrillation     on chronic coumadin  . Ischemic cardiomyopathy     EF 45% by echo 2010  . COPD (chronic obstructive pulmonary disease)   . On home O2     "24/7"  . Renal insufficiency   . Tremor   . Angina pectoris   . Obstructive sleep apnea on CPAP   . Obesity, morbid 06/15/2011  . Coronary artery disease   . Shortness of  breath   . Complication of anesthesia     "sometimes I wake up during OR"  . Myocardial infarct ? 1995  . Blood transfusion   . Anemia   . Hematuria   . Ulcer     "don't know what kind; it was years ago"  . Stress     "wife's been dx'd w/stage IV bone cancer"  . Recurrent coronary arteriosclerosis following PTCA 06/17/2011    Past Surgical History  Procedure Date  . Coronary stent placement     x 8  . Back surgery     x3  . Coronary angioplasty   . Coronary stent placement 4/12    Has stents in LAD, RCA,LCX  . Coronary angioplasty with stent placement     "? total of 9 stents over the years; 6 at one time"  . Ptca   . Tonsillectomy   . Appendectomy   . Insert / replace / remove pacemaker 07/2008  . Cataract extraction, bilateral     Family History  Problem Relation Age of Onset  . Heart disease Father   . Heart disease Brother   . Uterine cancer Mother   . Diabetes Sister     History  Substance Use Topics  . Smoking status: Former Smoker -- 2.0 packs/day for 52 years    Types: Cigarettes    Quit date: 07/26/2006  . Smokeless tobacco: Never Used  . Alcohol  Use: Yes     Comment: "did drink at one time for 5 years; stopped ~ 1980"      Review of Systems  Constitutional: Positive for fever, chills and fatigue.  HENT: Positive for sore throat and rhinorrhea. Negative for neck pain.   Eyes: Negative for redness.  Respiratory: Positive for cough. Negative for shortness of breath.   Cardiovascular: Positive for chest pain. Negative for palpitations and leg swelling.  Gastrointestinal: Positive for abdominal pain and diarrhea. Negative for nausea and vomiting.  Genitourinary: Negative for dysuria and decreased urine volume.  Musculoskeletal: Negative for myalgias.  Skin: Negative for rash.  Neurological: Negative for headaches.  Psychiatric/Behavioral: Positive for confusion.    Allergies  Chlorhexidine gluconate and Penicillins  Home Medications   Current  Outpatient Rx  Name  Route  Sig  Dispense  Refill  . ALBUTEROL SULFATE HFA 108 (90 BASE) MCG/ACT IN AERS   Inhalation   Inhale 1-2 puffs into the lungs every 6 (six) hours as needed. For shortness of breath         . ALBUTEROL SULFATE (2.5 MG/3ML) 0.083% IN NEBU   Nebulization   Take 2.5 mg by nebulization 2 (two) times daily.         . ASPIRIN EC 81 MG PO TBEC   Oral   Take 81 mg by mouth daily.           . BUDESONIDE 0.5 MG/2ML IN SUSP   Nebulization   Take 0.5 mg by nebulization 2 (two) times daily.         Marland Kitchen CARVEDILOL 6.25 MG PO TABS   Oral   Take by mouth 2 (two) times daily with a meal. Take 1 1/2 tabs each morning and 1 tab each evening         . CITALOPRAM HYDROBROMIDE 40 MG PO TABS   Oral   Take 40 mg by mouth daily.           . FUROSEMIDE 40 MG PO TABS   Oral   Take 40 mg by mouth daily.           . INSULIN ASPART PROT & ASPART (70-30) 100 UNIT/ML [place] SUSP   Subcutaneous   Inject 52-54 Units into the skin 2 (two) times daily with a meal.          . ISOSORBIDE MONONITRATE ER 30 MG PO TB24   Oral   Take 30 mg by mouth daily.           Marland Kitchen MAGNESIUM 250 MG PO TABS   Oral   Take 1 tablet by mouth daily.         Marland Kitchen NITROGLYCERIN 0.4 MG SL SUBL   Sublingual   Place 0.4 mg under the tongue every 5 (five) minutes as needed. For chest pain         . OXYBUTYNIN CHLORIDE 5 MG PO TABS   Oral   Take 5 mg by mouth 2 (two) times daily.           Marland Kitchen PANTOPRAZOLE SODIUM 40 MG PO TBEC   Oral   Take 40 mg by mouth daily.           Marland Kitchen PRIMIDONE 50 MG PO TABS   Oral   Take 50-100 mg by mouth 2 (two) times daily. 1 tab in the morning and 2 tabs at night          . SIMVASTATIN 80 MG PO TABS   Oral  Take 40 mg by mouth Daily. Take 1/2 tab at night         . SPIRONOLACTONE 25 MG PO TABS   Oral   Take 25 mg by mouth daily.           . TOPIRAMATE 50 MG PO TABS   Oral   Take 50 mg by mouth 2 (two) times daily.         . WARFARIN SODIUM  10 MG PO TABS   Oral   Take 10 mg by mouth daily.         [name] SODIUM 5 MG PO TABS   Oral   Take 2.5 mg by mouth every Monday, Wednesday, and Friday. Takes 2.5 mg along with 10 mg to equal 12.5 mg           BP 155/131  Pulse 69  Temp 98.2 F (36.8 C) (Oral)  Resp 25  SpO2 95%  Physical Exam  Nursing note and vitals reviewed. Constitutional: He is oriented to person, place, and time. He appears well-developed and well-nourished.  HENT:  Head: Normocephalic and atraumatic.  Right Ear: External ear normal.  Left Ear: External ear normal.  Nose: Nose normal.  Mouth/Throat: Uvula is midline and oropharynx is clear and moist. Mucous membranes are dry.  Eyes: Conjunctivae normal are normal. Right eye exhibits no discharge. Left eye exhibits no discharge.  Neck: Normal range of motion. Neck supple. No JVD present.  Cardiovascular: Normal rate and regular rhythm.   Murmur heard. Pulmonary/Chest: Effort normal and breath sounds normal. No respiratory distress. He has no wheezes. He has no rales.  Abdominal: Soft. Bowel sounds are normal. There is tenderness (mild, exam limited by habitus) in the right lower quadrant, periumbilical area and suprapubic area. There is no rebound and no guarding.  Neurological: He is alert and oriented to person, place, and time. He has normal strength. No cranial nerve deficit or sensory deficit.  Skin: Skin is warm and dry.  Psychiatric: He has a normal mood and affect.    ED Course  Procedures (including critical care time)  Labs Reviewed  CBC WITH DIFFERENTIAL - Abnormal; Notable for the following:    WBC 11.9 (*)     Hemoglobin 12.9 (*)     Neutrophils Relative 91 (*)     Neutro Abs 10.8 (*)     Lymphocytes Relative 5 (*)     Lymphs Abs 0.6 (*)     All other components within normal limits  COMPREHENSIVE METABOLIC PANEL - Abnormal; Notable for the following:    Glucose, Bld 153 (*)     GFR calc non Af Amer 52 (*)     GFR calc Af  Amer 60 (*)     All other components within normal limits  URINALYSIS, ROUTINE W REFLEX MICROSCOPIC - Abnormal; Notable for the following:    Hgb urine dipstick SMALL (*)     Ketones, ur 15 (*)     Protein, ur 30 (*)     All other components within normal limits  PROTIME-INR - Abnormal; Notable for the following:    Prothrombin Time 23.5 (*)     INR 2.20 (*)     All other components within normal limits  POCT I-STAT TROPONIN I  URINE MICROSCOPIC-ADD ON  CLOSTRIDIUM DIFFICILE BY PCR   Dg Chest 2 View  07/13/2012  *RADIOLOGY REPORT*  Clinical Data: Cough and congestion.  CHEST - 2 VIEW  Comparison: 06/23/2011.  Findings: The heart is  enlarged but stable.  The mediastinal and hilar contours are prominent but unchanged.  There is central vascular congestion and probable interstitial pulmonary edema.  No obvious pleural effusion on the lateral film.  IMPRESSION: Cardiac enlargement with vascular congestion and probable mild pulmonary edema.  No definite infiltrates.   Original Report Authenticated By: Rudie Meyer, M.D.      1. Diarrhea   2. Weakness     10:49 AM Patient seen and examined. Work-up initiated. Medications ordered.   Vital signs reviewed and are as follows: Filed Vitals:   07/13/12 1034  BP: 155/131  Pulse: 69  Temp: 98.2 F (36.8 C)  Resp: 25    Date: 07/13/2012  Rate: 56  Rhythm: paced  QRS Axis: left  Intervals: paced  ST/T Wave abnormalities: paced  Conduction Disutrbances:paced  Narrative Interpretation:   Old EKG Reviewed: unchanged from 06/15/2011  4:21 PM Results reviewed with and patient seen by Dr. Ranae Palms.   Patient continues to feel weak and have episodes of diarrhea. At this point, we feel he would not do well at home. C. diff sent. I have spoke with teaching service who will evaluate patient for admit.     MDM  Diarrhea/weakness: ?gastroenteritis  Elevated WBC count but no focal tenderness at this point. Will hold CT as no focal tenderness.  Feel ischemic bowel unlikely at this point. Anginal equivalent considered and troponin neg (2nd pending). Low risk for C. Diff but PCR sent.   Epigastric pain: EKG paced, unchanged. Trop neg.   Fever: No fever noted in ED.       Renne Crigler, [place] 07/13/12 [phone]

## 2012-07-13 NOTE — ED Notes (Signed)
Per [facility] pt was at home and 3 days ago he developed diarrhea and has become progressively weaker.  No distress noted.  Tremor noted to R arm (baseline).  Pt is alert and oriented.  He reports that this am he was to weak to stand and fell.  No trauma noted.  C/o Chronic back pain.  No Neuro deficits noted.

## 2012-07-13 NOTE — ED Notes (Signed)
Patient had another moderate amount liquid bowel movement.   Patient was cleaned up by RN and NT.

## 2012-07-13 NOTE — ED Notes (Signed)
Care transferred and report given to Lyn, RN.  

## 2012-07-13 NOTE — H&P (Signed)
Hospital Admission Note Date: 07/13/2012  Patient name: Bruce Taylor Medical record number: [id number] Date of birth: 1941-02-11 Age: 71 y.o. Gender: male PCP: Daisy Floro, MD  Medical Service: [facility]   Attending physician: Dr.Rogers   1st Contact: Dr. Zada Girt Pager: [phone]  2nd Contact: Dr. Manson Passey  Pager: [phone]  After 5 pm or weekends:  1st Contact: Pager: [phone]  2nd Contact: Pager: [phone]  Chief Complaint: Diarrhea for one day  History of Present Illness: 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 He reported that earlier today he developed an episode of diarrhea, which was associated with weakness. He described his stools as watery without blood and without foul smell. It looked yellow in color. He denied history of abdominal pain. He reported history of fevers, even though he did not take his body temperature at home. He denied history of vomiting, however, he reported feeling nauseous. His appetite was normal. His granddaughter, who visited them the previous day had some stomach aches, and flulike symptoms, but did not have diarrhea, or vomiting. She had been brought over after she was unwell and could not go to school. No one else in the family has experienced similar symptoms. He also reported that he felt weak in the morning and when he tried to stand up his legs give way before he fell down. He did not hit his head and denied loss of consciousness, no history of localised weakness in his extremities. His has chronic tremors involving the right arm mainly. His wife called EMS and the patient was brought to the ED. Prior to onset of diarrhea, the patient has been in  his usual state of health. The history is provided by the patient and the being supplemented by his spouse who was at bedside.  At baseline, [name] is only able to walk very minimal area around his house using a walker. He gets short of breath when he walks to the front door. His been on nocturnal CPAP for the last 20 years and home oxygen therapy for over the last 1 year. 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 He has 2 children and 2 grandchildren. He does not take alcohol. He used to be a heavy smoker about 3 packs per day for 30 years, but quit 4 years ago. He follows up with multiple physicians including cardiology, pulmonology, and a urologist. He has a primary care physician who he does not see very frequently.  Meds: Current Outpatient Rx  Name  Route  Sig  Dispense  Refill  . ALBUTEROL SULFATE HFA 108 (90 BASE) MCG/ACT IN AERS   Inhalation   Inhale 1-2 puffs into the lungs every 6 (six) hours as needed. For shortness of breath         . ALBUTEROL SULFATE (2.5 MG/3ML) 0.083% IN NEBU   Nebulization   Take 2.5 mg by nebulization 2 (two) times daily.         . ASPIRIN EC 81 MG PO TBEC   Oral   Take 81 mg by mouth daily.           . BUDESONIDE 0.5 MG/2ML IN SUSP   Nebulization   Take 0.5 mg by nebulization 2 (two) times daily.         Marland Kitchen  CARVEDILOL 6.25 MG PO TABS   Oral   Take 6.25-9.375 mg by mouth 2 (two) times daily with a meal. Take 1 1/2 tabs each morning and 1 tab each evening         . CITALOPRAM HYDROBROMIDE 40 MG PO TABS   Oral   Take 40 mg by mouth daily.           . FUROSEMIDE 40 MG PO TABS   Oral   Take 40 mg by mouth daily.           . INSULIN LISPRO PROT & LISPRO (75-25) 100 UNIT/ML [place] SUSP   Subcutaneous   Inject 64 Units into the skin 2 (two) times daily with a meal.         . ISOSORBIDE MONONITRATE ER 30 MG PO TB24   Oral   Take 30 mg by mouth daily.           Marland Kitchen MAGNESIUM 250 MG PO TABS   Oral   Take 250 mg by mouth daily.           Marland Kitchen NITROGLYCERIN 0.4 MG SL SUBL   Sublingual   Place 0.4 mg under the tongue every 5 (five) minutes as needed. For chest pain         . OXYBUTYNIN CHLORIDE 5 MG PO TABS   Oral   Take 5 mg by mouth 2 (two) times daily.           Marland Kitchen PANTOPRAZOLE SODIUM 40 MG PO TBEC   Oral   Take 40 mg by mouth daily.           Marland Kitchen PRIMIDONE 50 MG PO TABS   Oral   Take 50-100 mg by mouth 2 (two) times daily. 1 tab in the morning and 2 tabs at night          . SIMVASTATIN 80 MG PO TABS   Oral   Take 40 mg by mouth Daily. Take 1/2 tab at night         . SPIRONOLACTONE 25 MG PO TABS   Oral   Take 25 mg by mouth daily.           . WARFARIN SODIUM 5 MG PO TABS   Oral   Take 5-10 mg by mouth daily. Take 1 tablet on Mon and Fri and take 2 tablets the rest of the days.           Allergies: Allergies as of 07/13/2012 - Review Complete 07/13/2012  Allergen Reaction Noted  . Chlorhexidine gluconate Itching 06/16/2011  . Penicillins Swelling    Past Medical History  Diagnosis Date  . Hyperlipidemia   . Hypertension   . OSA (obstructive sleep apnea)   . Diabetes mellitus   . Complete heart block     s/p PPM  . Atrial fibrillation     on chronic coumadin  . Ischemic cardiomyopathy     EF 45% by echo 2010  . COPD (chronic obstructive pulmonary disease)   . On home O2     "24/7"  . Renal insufficiency   . Tremor   . Angina pectoris   . Obstructive sleep apnea on CPAP   . Obesity, morbid 06/15/2011  . Coronary artery disease   . Shortness of breath   . Complication of anesthesia     "sometimes I wake up during OR"  . Myocardial infarct ? 1995  . Blood transfusion   . Anemia   . Hematuria   .  Ulcer     "don't know what kind; it was years ago"  . Stress     "wife's been dx'd w/stage IV bone cancer"  . Recurrent coronary arteriosclerosis following PTCA 06/17/2011   Past Surgical History  Procedure Date  . Coronary stent placement     x 8  . Back surgery     x3   . Coronary angioplasty   . Coronary stent placement 4/12    Has stents in LAD, RCA,LCX  . Coronary angioplasty with stent placement     "? total of 9 stents over the years; 6 at one time"  . Ptca   . Tonsillectomy   . Appendectomy   . Insert / replace / remove pacemaker 07/2008  . Cataract extraction, bilateral    Family History  Problem Relation Age of Onset  . Heart disease Father   . Heart disease Brother   . Uterine cancer Mother   . Diabetes Sister    History   Social History  . Marital Status: Married    Spouse Name: N/A    Number of Children: N/A  . Years of Education: N/A   Occupational History  . retired Naval architect    Social History Main Topics  . Smoking status: Former Smoker -- 2.0 packs/day for 52 years    Types: Cigarettes    Quit date: 07/26/2006  . Smokeless tobacco: Never Used  . Alcohol Use: Yes     Comment: "did drink at one time for 5 years; stopped ~ 1980"  . Drug Use: No  . Sexually Active: No   Other Topics Concern  . Not on file   Social History Narrative  . No narrative on file    Review of Systems: Constitutional: Positive for fever, chills and fatigue.  HENT: Positive for sore throat and rhinorrhea. Negative for neck pain.  Eyes: Negative for redness.  Respiratory: Positive for cough. Negative for shortness of breath.  Cardiovascular: Positive for chest pain. Negative for palpitations and leg swelling.  Gastrointestinal: Positive for abdominal pain and diarrhea. Negative for nausea and vomiting.  Genitourinary: Negative for dysuria and decreased urine volume.  Musculoskeletal: Negative for myalgias.  Skin: Negative for rash.  Neurological: Negative for headaches.  Psychiatric/Behavioral: Positive for confusion.   Physical Exam: Blood pressure 111/44, pulse 57, temperature 98.2 F (36.8 C), temperature source Oral, resp. rate 20, SpO2 96.00%. Constitutional: He is oriented to person, place, and time. He appears well-developed  and well-nourished and morbidly obese.  HENT:  Head: Normocephalic and atraumatic.  Right Ear: External ear normal.  Left Ear: External ear normal.  Nose: Nose normal.  Mouth/Throat: Uvula is midline and oropharynx is clear and moist. Mucous membranes are dry.  Eyes: Conjunctivae normal are normal. Right eye exhibits no discharge. Left eye exhibits no discharge.  Neck: Normal range of motion. Neck supple. No JVD present.  Cardiovascular: Normal rate and regular rhythm.  Murmur heard.  Pulmonary/Chest: Effort normal and breath sounds normal. No respiratory distress. He has no wheezes. He has no rales.  Abdominal: Soft. Bowel sounds are normal. There is tenderness (mild, exam limited by habitus) in the right lower quadrant, periumbilical area and suprapubic area. There is no rebound and no guarding.  Neurological: He is alert and oriented to person, place, and time. He has normal strength. No cranial nerve deficit or sensory deficit.  Skin: Skin is warm and dry.  Psychiatric: He has a normal mood and affect. Lab results: Basic Metabolic Panel:  Alvira Philips  07/13/12 1049  NA 136  K 4.4  CL 100  CO2 23  GLUCOSE 153*  BUN 17  CREATININE 1.32  CALCIUM 8.8  MG --  PHOS --   Liver Function Tests:  Basename 07/13/12 1049  AST 19  ALT 11  ALKPHOS 57  BILITOT 0.7  PROT 7.8  ALBUMIN 3.6   CBC:  Basename 07/13/12 1049  WBC 11.9*  NEUTROABS 10.8*  HGB 12.9*  HCT 40.9  MCV 90.3  PLT 170   Coagulation:  Basename 07/13/12 1049  LABPROT 23.5*  INR 2.20*   Urine Drug Screen: Drugs of Abuse     Component Value Date/Time   LABOPIA POSITIVE* 04/29/2009 0006   COCAINSCRNUR NONE DETECTED 04/29/2009 0006   LABBENZ NONE DETECTED 04/29/2009 0006   AMPHETMU NONE DETECTED 04/29/2009 0006   THCU NONE DETECTED 04/29/2009 0006   LABBARB  Value: NONE DETECTED        DRUG SCREEN FOR MEDICAL PURPOSES ONLY.  IF CONFIRMATION IS NEEDED FOR ANY PURPOSE, NOTIFY LAB WITHIN 5 DAYS.        LOWEST  DETECTABLE LIMITS FOR URINE DRUG SCREEN Drug Class       Cutoff (ng/mL) Amphetamine      1000 Barbiturate      200 Benzodiazepine   200 Tricyclics       300 Opiates          300 Cocaine          300 THC              50 04/29/2009 0006    Urinalysis:  Basename 07/13/12 1227  COLORURINE YELLOW  LABSPEC 1.023  PHURINE 6.0  GLUCOSEU NEGATIVE  HGBUR SMALL*  BILIRUBINUR NEGATIVE  KETONESUR 15*  PROTEINUR 30*  UROBILINOGEN 1.0  NITRITE NEGATIVE  LEUKOCYTESUR NEGATIVE    Imaging results:  Dg Chest 2 View  07/13/2012  *RADIOLOGY REPORT*  Clinical Data: Cough and congestion.  CHEST - 2 VIEW  Comparison: 06/23/2011.  Findings: The heart is enlarged but stable.  The mediastinal and hilar contours are prominent but unchanged.  There is central vascular congestion and probable interstitial pulmonary edema.  No obvious pleural effusion on the lateral film.  IMPRESSION: Cardiac enlargement with vascular congestion and probable mild pulmonary edema.  No definite infiltrates.   Original Report Authenticated By: Rudie Meyer, M.D.     Other results: EKG:   Assessment & Plan by Problem: [name] is a 71 year old gentleman with multiple past medical history including congestive heart failure, COPD on home oxygen, morbidly obese, and 9 previous cardiac stents, status post pacemaker placement secondary to complete heart block, atrial fibrillation with chronic Coumadin use, who presented to the ED, with diarrhea for one day.  Viral enteritis: [name]  presented with a one-day history of diarrhea, feeling weak, subjective fevers, and chills, associated with mild abdominal pain, and nausea is a very unlikely to be a viral enteritis. His blood pressure on presentation was normal. Physical examination did not reveal impressive finding was within no abdominal tenderness. No rebound, no guarding. Patient has no recent history of use of antibiotics. This presentation is very suggestive of a viral enteritis,  given a history of a contact with her granddaughter, who had flulike symptoms with abdominal aches. However, C. difficile, PCR is warranted. Plan -Gentle rehydration given the setting of CHF. Care will be taken not to fluid overload him. -PCR of was C. Difficile. -Patient will be admitted to the med surgical bed for observation. HYPERTENSION:  On presentation his blood pressure is not overtly low. Will hold his Lasix, but he'll continue with his lisinopril, and coreg. Chronic respiratory failure: Will continue with CPAP oxygen to maintain his oxygen saturation between 91-93%. Stable CAD: Even though he reports history of chest pain, but he emphasizes that this really different from what he has experienced with heart attacks. Chest pain is mild. EKG unchanged from previous and troponins, and negative.  DM: A1c November 2012, was 6.8%. He is on home insulin, lispro protamine, insulin 75/25 64 units twice daily. Chronic a-fib: Patient has chronic atrial fibrillation, and is on Coumadin. INR in the ED, is 2.2. Continue with Coumadin.  Dispo: Disposition is deferred at this time, awaiting improvement of current medical problems. Anticipated discharge in approximately 1-2 day(s).   The patient does have a current PCP (ROSS,CHARLES ALAN, MD), therefore is not requiring OPC follow-up after discharge.   The patient does not have transportation limitations that hinder transportation to clinic appointments.  Signed: Dow Adolph 07/13/2012, 5:17 PM

## 2012-07-14 DIAGNOSIS — R197 Diarrhea, unspecified: Secondary | ICD-10-CM

## 2012-07-14 LAB — GLUCOSE, CAPILLARY
Glucose-Capillary: 120 mg/dL — ABNORMAL HIGH (ref 70–99)
Glucose-Capillary: 51 mg/dL — ABNORMAL LOW (ref 70–99)
Glucose-Capillary: 54 mg/dL — ABNORMAL LOW (ref 70–99)
Glucose-Capillary: 56 mg/dL — ABNORMAL LOW (ref 70–99)
Glucose-Capillary: 58 mg/dL — ABNORMAL LOW (ref 70–99)
Glucose-Capillary: 85 mg/dL (ref 70–99)
Glucose-Capillary: 96 mg/dL (ref 70–99)

## 2012-07-14 LAB — CBC
HCT: 39.5 % (ref 39.0–52.0)
Hemoglobin: 12.7 g/dL — ABNORMAL LOW (ref 13.0–17.0)
RBC: 4.26 MIL/uL (ref 4.22–5.81)
WBC: 7.9 10*3/uL (ref 4.0–10.5)

## 2012-07-14 LAB — BASIC METABOLIC PANEL
BUN: 19 mg/dL (ref 6–23)
BUN: 19 mg/dL (ref 6–23)
CO2: 23 mEq/L (ref 19–32)
Chloride: 100 mEq/L (ref 96–112)
Chloride: 102 mEq/L (ref 96–112)
Creatinine, Ser: 1.28 mg/dL (ref 0.50–1.35)
GFR calc Af Amer: 62 mL/min — ABNORMAL LOW (ref >90)
Glucose, Bld: 107 mg/dL — ABNORMAL HIGH (ref 70–99)
Glucose, Bld: 130 mg/dL — ABNORMAL HIGH (ref 70–99)
Potassium: 3.8 mEq/L (ref 3.5–5.1)
Potassium: 4.1 mEq/L (ref 3.5–5.1)
Sodium: 135 mEq/L (ref 135–145)

## 2012-07-14 LAB — CBC WITH DIFFERENTIAL/PLATELET
Basophils Absolute: 0 10*3/uL (ref 0.0–0.1)
Basophils Relative: 0 % (ref 0–1)
Eosinophils Relative: 1 % (ref 0–5)
HCT: 38.1 % — ABNORMAL LOW (ref 39.0–52.0)
Hemoglobin: 12.3 g/dL — ABNORMAL LOW (ref 13.0–17.0)
MCH: 29.4 pg (ref 26.0–34.0)
MCHC: 32.3 g/dL (ref 30.0–36.0)
MCV: 91.1 fL (ref 78.0–100.0)
Monocytes Absolute: 1 10*3/uL (ref 0.1–1.0)
Monocytes Relative: 16 % — ABNORMAL HIGH (ref 3–12)
Neutro Abs: 4.5 10*3/uL (ref 1.7–7.7)
RDW: 15.6 % — ABNORMAL HIGH (ref 11.5–15.5)

## 2012-07-14 MED ORDER — SODIUM CHLORIDE 0.9 % IV SOLN
INTRAVENOUS | Status: DC
  Administered 2012-07-14 – 2012-07-15 (×2): via INTRAVENOUS

## 2012-07-14 MED ORDER — BIOTENE DRY MOUTH MT LIQD
15.0000 mL | Freq: Two times a day (BID) | OROMUCOSAL | Status: DC
  Administered 2012-07-14 – 2012-07-18 (×9): 15 mL via OROMUCOSAL

## 2012-07-14 MED ORDER — DEXTROSE 50 % IV SOLN
INTRAVENOUS | Status: AC
  Administered 2012-07-14: 25 mL
  Filled 2012-07-14: qty 50

## 2012-07-14 MED ORDER — GLUCOSE 40 % PO GEL
ORAL | Status: AC
  Administered 2012-07-14: 37.5 g
  Filled 2012-07-14: qty 1

## 2012-07-14 NOTE — H&P (Signed)
IM Attending  33 man with several chronic ills, adm now for acute diarrhea after family exposure.  No fever, minimal abd pain.  Minimal lab derangements.  He was admitted chiefly due to relative immobility and discomfort of active diarrhea.  He has DM Sharl Ma), CAD Tresa Endo), tremor Anne Hahn).  These are not active or unstable at the moment. Afebrile and no guarding.  Advise conservative rx and observation.  His disability is such that a minor acute illness affects his ability to function.  When the diarrhea subsides, will need discharge planning, perhaps OT eval.

## 2012-07-14 NOTE — Progress Notes (Signed)
Subjective: [name] had a great night. However, he still diarrhea. He had about 7 episodes over the night. No new complaints and he hopes he will be home before christmas.  Objective: Vital signs in last 24 hours: Filed Vitals:   07/13/12 2314 07/13/12 2342 07/14/12 0536 07/14/12 0751  BP:   118/60   Pulse:   56   Temp:   98 F (36.7 C)   TempSrc:   Oral   Resp:  20 20   Height:      Weight:      SpO2: 95% 95% 93% 92%   Weight change:   Intake/Output Summary (Last 24 hours) at 07/14/12 1350 Last data filed at 07/14/12 1610  Gross per 24 hour  Intake    945 ml  Output    200 ml  Net    745 ml   Physical exam: Constitutional: He is oriented to person, place, and time. He appears well-developed and well-nourished and morbidly obese.   Mouth/Throat: Uvula is midline and oropharynx is clear and moist. Mucous membranes are dry.  Eyes: Conjunctivae normal are normal. Right eye exhibits no discharge. Left eye exhibits no discharge.  Neck: Normal range of motion. Neck supple. No JVD present.  Cardiovascular: Normal rate and regular rhythm. Murmur heard.  Pulmonary/Chest: Effort normal and breath sounds normal. No respiratory distress. He has no wheezes. He has no rales.  Abdominal: Soft. Bowel sounds are normal. There is tenderness (mild, exam limited by habitus) in the right lower quadrant, periumbilical area and suprapubic area. There is no rebound and no guarding.  Neurological: He is alert and oriented to person, place, and time. He has normal strength. No cranial nerve deficit or sensory deficit.  Skin: Skin is warm and dry.  Psychiatric: He has a normal mood and affect.  Lab Results: Basic Metabolic Panel:  Lab 07/14/12 9604 07/14/12 0540  NA 135 132*  K 4.1 3.8  CL 102 100  CO2 23 23  GLUCOSE 130* 107*  BUN 19 19  CREATININE 1.26 1.28  CALCIUM 8.4 8.3*  MG -- --  PHOS -- --   Liver Function Tests:  Lab 07/13/12 1049  AST 19  ALT 11  ALKPHOS 57  BILITOT 0.7   PROT 7.8  ALBUMIN 3.6   CBC:  Lab 07/14/12 0815 07/14/12 0540 07/13/12 1049  WBC 7.9 6.5 --  NEUTROABS -- 4.5 10.8*  HGB 12.7* 12.3* --  HCT 39.5 38.1* --  MCV 92.7 91.1 --  PLT 142* 152 --   Cardiac Enzymes:  Lab 07/13/12 2141  CKTOTAL --  CKMB --  CKMBINDEX --  TROPONINI <0.30   CBG:  Lab 07/14/12 1203 07/14/12 0749  GLUCAP 120* 121*   Coagulation:  Lab 07/14/12 0540 07/13/12 1049  LABPROT 22.8* 23.5*  INR 2.11* 2.20*   Anemia Panel: Urine Drug Screen: Drugs of Abuse   Urinalysis:  Lab 07/13/12 1227  COLORURINE YELLOW  LABSPEC 1.023  PHURINE 6.0  GLUCOSEU NEGATIVE  HGBUR SMALL*  BILIRUBINUR NEGATIVE  KETONESUR 15*  PROTEINUR 30*  UROBILINOGEN 1.0  NITRITE NEGATIVE  LEUKOCYTESUR NEGATIVE    Micro Results: Recent Results (from the past 240 hour(s))  CLOSTRIDIUM DIFFICILE BY PCR     Status: Normal   Collection Time   07/13/12  3:21 PM      Component Value Range Status Comment   C difficile by pcr NEGATIVE  NEGATIVE Final    Studies/Results: Dg Chest 2 View  07/13/2012  *RADIOLOGY REPORT*  Clinical  Data: Cough and congestion.  CHEST - 2 VIEW  Comparison: 06/23/2011.  Findings: The heart is enlarged but stable.  The mediastinal and hilar contours are prominent but unchanged.  There is central vascular congestion and probable interstitial pulmonary edema.  No obvious pleural effusion on the lateral film.  IMPRESSION: Cardiac enlargement with vascular congestion and probable mild pulmonary edema.  No definite infiltrates.   Original Report Authenticated By: Rudie Meyer, M.D.    Medications: I have reviewed the patient's current medications. Scheduled Meds:   . albuterol  2.5 mg Nebulization BID  . antiseptic oral rinse  15 mL Mouth Rinse q12n4p  . aspirin EC  81 mg Oral Daily  . atorvastatin  40 mg Oral q1800  . budesonide  0.5 mg Nebulization BID  . carvedilol  6.25 mg Oral QAC supper  . carvedilol  9.375 mg Oral QAC breakfast  . citalopram  40  mg Oral Daily  . furosemide  40 mg Oral Daily  . insulin aspart protamine-insulin aspart  50 Units Subcutaneous BID WC  . isosorbide mononitrate  30 mg Oral Daily  . magnesium oxide  200 mg Oral Daily  . oxybutynin  5 mg Oral BID  . pantoprazole  40 mg Oral Daily  . primidone  100 mg Oral QHS  . primidone  50 mg Oral Daily  . spironolactone  25 mg Oral Daily  . traZODone  50 mg Oral QHS  . warfarin  10 mg Oral Custom  . warfarin  5 mg Oral Custom  . Warfarin - Physician Dosing Inpatient   Does not apply q1800   Continuous Infusions:  PRN Meds:.acetaminophen, acetaminophen, albuterol, nitroGLYCERIN, ondansetron Assessment/Plan: [name] is a 71 year old gentleman with multiple past medical history including congestive heart failure, COPD on home oxygen, morbidly obese, and 9 previous cardiac stents, status post pacemaker placement secondary to complete heart block, atrial fibrillation with chronic Coumadin use, who presented to the ED, with diarrhea for one day.   Viral enteritis: [name] presented with a one-day history of diarrhea, feeling weak, subjective fevers, and chills, associated with mild abdominal pain, and nausea is a very unlikely to be a viral enteritis. His blood pressure on presentation was normal. Physical examination did not reveal impressive finding was within no abdominal tenderness. No rebound, no guarding. Patient has no recent history of use of antibiotics. This presentation was c/w viral enteritis, given a history of a contact with her granddaughter, who had flulike symptoms with abdominal aches. C.diff - negative  Plan  -Gentle rehydration given the setting of CHF. Care will be taken not to fluid overload him.  -Patient will be admitted to the med surgical bed for observation.  - continue with observation and he can be discharged once diarrhea improves - may be 1-2 days. He will hopefully be home before Christmas as he wishes. - we will continue with IV  fluids and unrestricted oral intake if he continues without vomiting  HYPERTENSION: On presentation his blood pressure is not overtly low. Will hold his Lasix, but he'll continue with his lisinopril, and coreg.   Chronic respiratory failure: Will continue with CPAP oxygen to maintain his oxygen saturation between 91-93%.  Stable CAD: Even though he reports history of chest pain, but he emphasizes that this really different from what he has experienced with heart attacks. Chest pain is mild. EKG unchanged from previous and troponins, and negative.  DM: A1c November 2012, was 6.8%. He is on home insulin, lispro protamine, insulin 75/25  64 units twice daily.  Chronic a-fib: Patient has chronic atrial fibrillation, and is on Coumadin. INR in the ED, is 2.2. Continue with Coumadin.   Dispo: Disposition is deferred at this time, awaiting improvement of current medical problems. Anticipated discharge in approximately 1-2 day(s).  The patient does have a current PCP (ROSS,CHARLES ALAN, MD), therefore is not requiring OPC follow-up after discharge.  The patient does not have transportation limitations that hinder transportation to clinic appointments.    LOS: 1 day   Dow Adolph 07/14/2012, 1:50 PM

## 2012-07-14 NOTE — Evaluation (Signed)
Physical Therapy Evaluation Patient Details Name: Bruce Taylor MRN: [id number] DOB: [birth year] Today's Date: 07/14/2012 Time: 1210-1232 PT Time Calculation (min): 22 min  PT Assessment / Plan / Recommendation Clinical Impression  Pt adm for diarrhea.  Pt very limited with mobility and very SOB with exertion.  Pt's wife ill at home and won't be able to provide physical assist.  Recommend ST-SNF and pt in agreement.    PT Assessment  Patient needs continued PT services    Follow Up Recommendations  SNF    Does the patient have the potential to tolerate intense rehabilitation      Barriers to Discharge Decreased caregiver support      Equipment Recommendations  None recommended by PT    Recommendations for Other Services     Frequency Min 3X/week    Precautions / Restrictions Precautions Precautions: Fall   Pertinent Vitals/Pain Dyspnea 3-4/4 with activity.  SaO2 >92% on 4L at rest and with activity.      Mobility  Bed Mobility Bed Mobility: Rolling Left;Rolling Right;Left Sidelying to Sit;Sitting - Scoot to [place] of Bed;Sit to Sidelying Right;Sit to Sidelying Left;Scooting to [facility] Rolling Left: 2: Max assist Left Sidelying to Sit: 1: +2 Total assist;With rails;HOB elevated Left Sidelying to Sit: Patient Percentage: 30% Sitting - Scoot to Edge of Bed: 4: Min assist Sit to Sidelying Left: 1: +2 Total assist;HOB flat Sit to Sidelying Left: Patient Percentage: 30% Details for Bed Mobility Assistance: Assist to support trunk and LEs in/out of bed.  VC for sequencing and hand placement during side-lying<>sit. Transfers Sit to Stand: 1: +2 Total assist;From bed;With upper extremity assist Sit to Stand: Patient Percentage: 50% Stand to Sit: 1: +2 Total assist;To bed;With armrests;With upper extremity assist Stand to Sit: Patient Percentage: 50% Ambulation/Gait Ambulation/Gait Assistance: 1: +2 Total assist Ambulation/Gait: Patient Percentage: 70% Ambulation Distance  (Feet): 2 Feet (sidestepping by bed) Assistive device: Rolling walker Gait Pattern: Wide base of support General Gait Details: Pt with dyspnea 3/4 with standing and sidestepping.      Shoulder Instructions     Exercises     PT Diagnosis: Difficulty walking;Generalized weakness  PT Problem List: Decreased strength;Decreased activity tolerance;Decreased balance;Decreased mobility;Obesity;Cardiopulmonary status limiting activity PT Treatment Interventions: DME instruction;Gait training;Functional mobility training;Patient/family education;Therapeutic activities;Therapeutic exercise;Balance training   PT Goals Acute Rehab PT Goals PT Goal Formulation: With patient Time For Goal Achievement: 07/28/12 Potential to Achieve Goals: Fair Pt will go Supine/Side to Sit: with mod assist PT Goal: Supine/Side to Sit - Progress: Goal set today Pt will go Sit to Supine/Side: with mod assist PT Goal: Sit to Supine/Side - Progress: Goal set today Pt will go Sit to Stand: with min assist PT Goal: Sit to Stand - Progress: Goal set today Pt will go Stand to Sit: with min assist PT Goal: Stand to Sit - Progress: Goal set today Pt will Ambulate: 16 - 50 feet;with supervision;with least restrictive assistive device PT Goal: Ambulate - Progress: Goal set today  Visit Information  Last PT Received On: 07/14/12 Assistance Needed: +2    Subjective Data  Subjective: "I don't know how much I can do." Patient Stated Goal: Return home   Prior Functioning  Home Living Lives With: Spouse Available Help at Discharge: Family Type of Home: House Home Access: Stairs to enter Secretary/administrator of Steps: 6 Entrance Stairs-Rails: Right;Left Home Layout: One level Bathroom Shower/Tub: Tub/shower unit;Curtain Bathroom Toilet: Handicapped height Bathroom Accessibility: No Home Adaptive Equipment: Bedside commode/3-in-1;Shower chair with back (rollator; electric bed  that can raise [facility]) Additional Comments:  Wife has stage 4 bone cancer. Prior Function Level of Independence: Independent with assistive device(s) (uses rollator when out in community) Driving: Yes Comments: wears O2 at home    Cognition  Overall Cognitive Status: Appears within functional limits for tasks assessed/performed Arousal/Alertness: Awake/alert Orientation Level: Appears intact for tasks assessed Behavior During Session: [facility] for tasks performed    Extremity/Trunk Assessment Right Upper Extremity Assessment RUE ROM/Strength/Tone: [facility] for tasks assessed;Deficits RUE ROM/Strength/Tone Deficits: RUE tremors at rest and with activity. Pt reports this is his baseline and is still able to manage during ADLs.  Left Upper Extremity Assessment LUE ROM/Strength/Tone: WFL for tasks assessed Right Lower Extremity Assessment RLE ROM/Strength/Tone: Deficits RLE ROM/Strength/Tone Deficits: functionally 4/5 Left Lower Extremity Assessment LLE ROM/Strength/Tone: Deficits LLE ROM/Strength/Tone Deficits: functionally 4/5   Balance Static Standing Balance Static Standing - Balance Support: Bilateral upper extremity supported (on walker) Static Standing - Level of Assistance: 4: Min assist  End of Session PT - End of Session Activity Tolerance: Patient limited by fatigue Patient left: in bed;with call bell/phone within reach Nurse Communication: Mobility status  GP Functional Assessment Tool Used: clinical judgement Functional Limitation: Mobility: Walking and moving around Mobility: Walking and Moving Around Current Status (O1308): At least 60 percent but less than 80 percent impaired, limited or restricted   [facility] 07/14/2012, 3:34 PM  [facility] PT [phone]

## 2012-07-14 NOTE — Evaluation (Addendum)
Occupational Therapy Evaluation Patient Details Name: Bruce Taylor MRN: [id number] DOB: 11-08-1940 Today's Date: 07/14/2012 Time: 4098-1191 OT Time Calculation (min): 21 min  OT Assessment / Plan / Recommendation Clinical Impression  Pt admitted s/p fall at home as well as presenting with acute diarrhea and weakness.  Will benefit from acute OT services to address below problem list. Recommending ST SNF to further maximize independence and safety with ADLs before return home.    OT Assessment  Patient needs continued OT Services    Follow Up Recommendations  SNF    Barriers to Discharge Decreased caregiver support wife unable to provide heavy physical assist.  Equipment Recommendations  None recommended by OT    Recommendations for Other Services    Frequency  Min 2X/week    Precautions / Restrictions Precautions Precautions: Fall   Pertinent Vitals/Pain See vitals    ADL  Grooming: Performed;Wash/dry face Where Assessed - Grooming: Unsupported sitting Upper Body Bathing: Simulated;Minimal assistance Where Assessed - Upper Body Bathing: Unsupported sitting Lower Body Bathing: Simulated;Maximal assistance Where Assessed - Lower Body Bathing: Supported sit to stand Upper Body Dressing: Simulated;Minimal assistance Where Assessed - Upper Body Dressing: Unsupported sitting Lower Body Dressing: Simulated;Maximal assistance Where Assessed - Lower Body Dressing: Supported sit to Pharmacist, hospital: Simulated;+2 Total assistance Toilet Transfer: Patient Percentage: 50% Statistician Method: Sit to Barista: Other (comment) (bed) Equipment Used: Gait belt;Rolling walker Transfers/Ambulation Related to ADLs: +2 assist (pt=50%) for side steps to [facility] with RW ADL Comments: Pt declining OOB transfer at this time due to frequent diarrhea throughout day (no diarrhea during session).  Frequently needs to use urinal following transfers (supine<>sit,  sit<>stand).     OT Diagnosis: Generalized weakness  OT Problem List: Decreased strength;Decreased activity tolerance;Impaired balance (sitting and/or standing);Decreased knowledge of use of DME or AE;Obesity OT Treatment Interventions: Self-care/ADL training;DME and/or AE instruction;Therapeutic activities;Patient/family education;Balance training   OT Goals Acute Rehab OT Goals OT Goal Formulation: With patient Time For Goal Achievement: 07/28/12 Potential to Achieve Goals: Good ADL Goals Pt Will Perform Grooming: with modified independence;Standing at sink ADL Goal: Grooming - Progress: Goal set today Pt Will Perform Upper Body Bathing: with modified independence;Sitting, chair;Sitting, edge of bed ADL Goal: Upper Body Bathing - Progress: Goal set today Pt Will Perform Lower Body Bathing: with modified independence;Sit to stand from chair;Sit to stand from bed;with adaptive equipment ADL Goal: Lower Body Bathing - Progress: Goal set today Pt Will Perform Upper Body Dressing: with modified independence;Sitting, chair;Sitting, bed ADL Goal: Upper Body Dressing - Progress: Goal set today Pt Will Perform Lower Body Dressing: with modified independence;Sit to stand from chair;Sit to stand from bed;with adaptive equipment ADL Goal: Lower Body Dressing - Progress: Goal set today Pt Will Transfer to Toilet: with modified independence;Ambulation;with DME;Comfort height toilet ADL Goal: Toilet Transfer - Progress: Goal set today Pt Will Perform Toileting - Clothing Manipulation: with modified independence;Sitting on 3-in-1 or toilet;Standing ADL Goal: Toileting - Clothing Manipulation - Progress: Goal set today Pt Will Perform Toileting - Hygiene: with modified independence;Sit to stand from 3-in-1/toilet;Standing at 3-in-1/toilet ADL Goal: Toileting - Hygiene - Progress: Goal set today Miscellaneous OT Goals Miscellaneous OT Goal #1: Pt will perform all bed mobility at mod I level as precursor  for EOB ADLs. OT Goal: Miscellaneous Goal #1 - Progress: Goal set today  Visit Information  Last OT Received On: 07/14/12 Assistance Needed: +2 PT/OT Co-Evaluation/Treatment: Yes    Subjective Data      Prior Functioning  Home Living Lives With: Spouse Available Help at Discharge: Family Type of Home: House Home Access: Stairs to enter Secretary/administrator of Steps: 6 Entrance Stairs-Rails: Right;Left Home Layout: One level Bathroom Shower/Tub: Forensic scientist: Handicapped height Bathroom Accessibility: No Home Adaptive Equipment: Bedside commode/3-in-1;Shower chair with back (rollator; electric bed that can raise HOB) Additional Comments: Wife has stage 4 bone cancer. Prior Function Level of Independence: Independent with assistive device(s) (uses rollator when out in community) Driving: Yes Comments: wears O2 at home         Vision/Perception     Cognition  Overall Cognitive Status: Appears within functional limits for tasks assessed/performed Arousal/Alertness: Awake/alert Orientation Level: Appears intact for tasks assessed Behavior During Session: [facility] for tasks performed    Extremity/Trunk Assessment Right Upper Extremity Assessment RUE ROM/Strength/Tone: [facility] for tasks assessed;Deficits RUE ROM/Strength/Tone Deficits: RUE tremors at rest and with activity. Pt reports this is his baseline and is still able to manage during ADLs.  Left Upper Extremity Assessment LUE ROM/Strength/Tone: WFL for tasks assessed Right Lower Extremity Assessment RLE ROM/Strength/Tone: Deficits RLE ROM/Strength/Tone Deficits: functionally 4/5 Left Lower Extremity Assessment LLE ROM/Strength/Tone: Deficits LLE ROM/Strength/Tone Deficits: functionally 4/5     Mobility Bed Mobility Bed Mobility: Rolling Left;Rolling Right;Left Sidelying to Sit;Sitting - Scoot to [place] of Bed;Sit to Sidelying Right;Sit to Sidelying Left;Scooting to [facility] Rolling Left: 2:  Max assist Left Sidelying to Sit: 1: +2 Total assist;With rails;HOB elevated Left Sidelying to Sit: Patient Percentage: 30% Sitting - Scoot to Edge of Bed: 4: Min assist Sit to Sidelying Left: 1: +2 Total assist;HOB flat Sit to Sidelying Left: Patient Percentage: 30% Details for Bed Mobility Assistance: Assist to support trunk and LEs in/out of bed.  VC for sequencing and hand placement during side-lying<>sit. Transfers Transfers: Sit to Stand;Stand to Sit Sit to Stand: 1: +2 Total assist;From bed;With upper extremity assist Sit to Stand: Patient Percentage: 50% Stand to Sit: 1: +2 Total assist;To bed;With armrests;With upper extremity assist Stand to Sit: Patient Percentage: 50%     Shoulder Instructions     Exercise     Balance     End of Session OT - End of Session Equipment Utilized During Treatment: Gait belt Activity Tolerance: Other (comment);Patient limited by fatigue (limited by diarrhea) Patient left: in bed;with call bell/phone within reach Nurse Communication: Mobility status;Other (comment) (voided 300 cc urine in urinal)  GO Functional Assessment Tool Used: clinical judgement Functional Limitation: Self care Self Care Current Status (M8413): At least 60 percent but less than 80 percent impaired, limited or restricted Self Care Goal Status [phone]): 0 percent impaired, limited or restricted  07/14/2012 Cipriano Mile OTR/L Pager [phone] Office [phone]  Cipriano Mile 07/14/2012, 2:52 PM

## 2012-07-14 NOTE — Progress Notes (Signed)
Pt CBG 54 at 2159, dextrose 40% oral gel given per hypoglycemic protocol, rechecked at 2223 CBG 51, Apple Juice 4 ounces PO given, rechecked at 2310 CBG 56, rechecked to verify at 2312 CBG 58, dextrose 50% given IV, CBG rechecked at 2340 CBG 96. Pt in bed and asymptomatic. Pt states "I did not feel like eatting dinner tonight". Julien Nordmann [facility]

## 2012-07-14 NOTE — Progress Notes (Signed)
Nutrition Brief Note  Patient identified on the Malnutrition Screening Tool (MST) Report for weight loss and poor intake.  Patient admitted with one day of diarrhea.  Weight loss is not severe and current intake is adequate.  Wt Readings from Last 10 Encounters:  07/13/12 301 lb 2.4 oz (136.6 kg)  06/09/12 307 lb 12.8 oz (139.617 kg)  12/08/11 308 lb 6.4 oz (139.889 kg)  07/14/11 315 lb 3.2 oz (142.974 kg)  06/17/11 317 lb 3.9 oz (143.9 kg)  06/17/11 317 lb 3.9 oz (143.9 kg)  03/10/11 311 lb 6.4 oz (141.25 kg)  02/10/11 311 lb (141.069 kg)  10/14/09 311 lb (141.069 kg)  09/12/09 321 lb (145.605 kg)     Body mass index is 41.42 kg/(m^2). Pt meets criteria for morbid obesity based on current BMI.   Current diet order is CHO-modified medium, patient is consuming approximately 100% of meals at this time. Labs and medications reviewed.   No nutrition interventions warranted at this time. If nutrition issues arise, please consult RD.   Joaquin Courts, RD, LDN, CNSC Pager# [phone] After Hours Pager# [phone]

## 2012-07-14 NOTE — Progress Notes (Signed)
Pt admitted & oriented to unit.  Pt is A&O, VS stable, and skin intact with a red blanchable sacrum. Pt is now resting comfortably in bed.

## 2012-07-15 DIAGNOSIS — A088 Other specified intestinal infections: Principal | ICD-10-CM

## 2012-07-15 LAB — CBC
HCT: 36 % — ABNORMAL LOW (ref 39.0–52.0)
MCHC: 32.2 g/dL (ref 30.0–36.0)
MCV: 90.9 fL (ref 78.0–100.0)
Platelets: 144 10*3/uL — ABNORMAL LOW (ref 150–400)
RDW: 15.5 % (ref 11.5–15.5)
WBC: 7.1 10*3/uL (ref 4.0–10.5)

## 2012-07-15 LAB — BASIC METABOLIC PANEL
BUN: 20 mg/dL (ref 6–23)
Chloride: 102 mEq/L (ref 96–112)
Creatinine, Ser: 1.29 mg/dL (ref 0.50–1.35)
GFR calc Af Amer: 62 mL/min — ABNORMAL LOW (ref >90)
GFR calc non Af Amer: 53 mL/min — ABNORMAL LOW (ref >90)

## 2012-07-15 LAB — GLUCOSE, CAPILLARY
Glucose-Capillary: 177 mg/dL — ABNORMAL HIGH (ref 70–99)
Glucose-Capillary: 113 mg/dL — ABNORMAL HIGH (ref 70–99)

## 2012-07-15 LAB — PROTIME-INR: Prothrombin Time: 25.5 seconds — ABNORMAL HIGH (ref 11.6–15.2)

## 2012-07-15 MED ORDER — INSULIN ASPART PROT & ASPART (70-30 MIX) 100 UNIT/ML ~~LOC~~ SUSP
30.0000 [IU] | Freq: Two times a day (BID) | SUBCUTANEOUS | Status: DC
  Administered 2012-07-15 – 2012-07-18 (×6): 30 [IU] via SUBCUTANEOUS
  Filled 2012-07-15: qty 10

## 2012-07-15 MED ORDER — INSULIN ASPART PROT & ASPART (70-30 MIX) 100 UNIT/ML ~~LOC~~ SUSP: 40.0000 [IU] | Freq: Two times a day (BID) | SUBCUTANEOUS | Status: DC

## 2012-07-15 MED ORDER — SODIUM CHLORIDE 0.9 % IJ SOLN
3.0000 mL | Freq: Two times a day (BID) | INTRAMUSCULAR | Status: DC
  Administered 2012-07-15 – 2012-07-18 (×6): 3 mL via INTRAVENOUS

## 2012-07-15 NOTE — Discharge Summary (Deleted)
Internal Medicine Teaching [facility] Discharge Note  Name: Bruce Taylor MRN: [id number] DOB: [birth year] 71 y.o.  Date of Admission: 07/13/2012 10:13 AM Date of Discharge: 07/15/2012 Attending Physician: Ulyess Mort, MD  Discharge Diagnosis: 1. Viral gastroenteritis - improved with conservative measures 2. COPD - chronic, on home O2 3. CAD 4. Diabetes Mellitus type 2 5. Chronic atrial fibrillation 6. Hypertension 7. Deconditioning - patient declined SNF, sent home with [facility] PT/OT  Discharge Medications:   Medication List     As of 07/15/2012 10:15 AM    TAKE these medications         aspirin EC 81 MG tablet   Take 81 mg by mouth daily.      budesonide 0.5 MG/2ML nebulizer solution   Commonly known as: PULMICORT   Take 0.5 mg by nebulization 2 (two) times daily.      carvedilol 6.25 MG tablet   Commonly known as: COREG   Take 6.25-9.375 mg by mouth 2 (two) times daily with a meal. Take 1 1/2 tabs each morning and 1 tab each evening      citalopram 40 MG tablet   Commonly known as: CELEXA   Take 40 mg by mouth daily.      furosemide 40 MG tablet   Commonly known as: LASIX   Take 40 mg by mouth daily.      insulin lispro protamine-insulin lispro (75-25) 100 UNIT/ML Susp   Commonly known as: HUMALOG 75/25   Inject 64 Units into the skin 2 (two) times daily with a meal.      isosorbide mononitrate 30 MG 24 hr tablet   Commonly known as: IMDUR   Take 30 mg by mouth daily.      Magnesium 250 MG Tabs   Take 250 mg by mouth daily.      nitroGLYCERIN 0.4 MG SL tablet   Commonly known as: NITROSTAT   Place 0.4 mg under the tongue every 5 (five) minutes as needed. For chest pain      oxybutynin 5 MG tablet   Commonly known as: DITROPAN   Take 5 mg by mouth 2 (two) times daily.      pantoprazole 40 MG tablet   Commonly known as: PROTONIX   Take 40 mg by mouth daily.      primidone 50 MG tablet   Commonly known as: MYSOLINE   Take 50-100 mg by  mouth 2 (two) times daily. 1 tab in the morning and 2 tabs at night      PROAIR HFA 108 (90 BASE) MCG/ACT inhaler   Generic drug: albuterol   Inhale 1-2 puffs into the lungs every 6 (six) hours as needed. For shortness of breath      albuterol (2.5 MG/3ML) 0.083% nebulizer solution   Commonly known as: PROVENTIL   Take 2.5 mg by nebulization 2 (two) times daily.      simvastatin 80 MG tablet   Commonly known as: ZOCOR   Take 40 mg by mouth Daily. Take 1/2 tab at night      spironolactone 25 MG tablet   Commonly known as: ALDACTONE   Take 25 mg by mouth daily.      warfarin 10 MG tablet   Commonly known as: COUMADIN   Take 10 mg by mouth See admin instructions. Takes 10mg  (1 tab) on Tuesday, Wednesday, Thursday, Saturday, Sunday      warfarin 5 MG tablet   Commonly known as: COUMADIN   Take 5 mg by mouth  See admin instructions. Takes 5mg  (1 tab) on Monday and Friday        Disposition and follow-up:   Bruce Taylor was discharged from [facility] in stable and improved condition, with resolution of diabetes.  The patient will follow-up with Dr. Tenny Craw on 07/27/12 to ensure symptoms of viral gastroenteritis have not returned, and that the patient is recovering from the deconditioning caused by his recent illness.   Follow-up Appointments: Follow-up Information    Follow up with Daisy Floro, MD. On 07/27/2012. (12:15 pm)    Contact information:   [address] [place] [address] [phone]          Discharge Orders    Future Appointments: Provider: Department: Dept Phone: Center:   12/08/2012 10:30 AM Barbaraann Share, MD [facility] [phone] None     Future Orders Please Complete By Expires   Diet Carb Modified      Increase activity slowly      Discharge instructions      Comments:   You were hospitalized with Viral Gastroenteritis (aka Stomach Flu).  Your symptoms have now resolved.  To prevent this from happening in  the future, be sure to wash your hands thoroughly after coming in contact with others who may have this illness.   Call MD for:  persistant nausea and vomiting      Call MD for:  temperature >100.4      Call MD for:  severe uncontrolled pain         Consultations: None  Procedures Performed:  Dg Chest 2 View  07/13/2012  *RADIOLOGY REPORT*  Clinical Data: Cough and congestion.  CHEST - 2 VIEW  Comparison: 06/23/2011.  Findings: The heart is enlarged but stable.  The mediastinal and hilar contours are prominent but unchanged.  There is central vascular congestion and probable interstitial pulmonary edema.  No obvious pleural effusion on the lateral film.  IMPRESSION: Cardiac enlargement with vascular congestion and probable mild pulmonary edema.  No definite infiltrates.   Original Report Authenticated By: Rudie Meyer, M.D.     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 He reported that earlier today he developed an episode of diarrhea, which was associated with weakness. He described his stools as watery without blood and without foul smell. It looked yellow in color. He denied history of abdominal pain. He reported history of fevers, even though he did not take his body temperature at home. He denied history of vomiting, however, he reported feeling nauseous. His appetite was normal. His granddaughter, who visited them the previous day had some stomach aches, and flulike symptoms, but did not have diarrhea, or vomiting. She had been brought over after she was unwell and could not go to school. No one else in the family has experienced similar symptoms. He also reported that he felt weak  in the morning and when he tried to stand up his legs give way before he fell down. He did not hit his head and denied loss of consciousness, no history of localised weakness in his extremities. His has chronic tremors involving the right arm mainly. His wife called EMS and the patient was brought to the ED. Prior to  onset of diarrhea, the patient has been in his usual state of health. The history is provided by the patient and the being supplemented by his spouse who was at bedside.  At baseline, Bruce Taylor is only able to walk very minimal area around his house using a walker. He gets short of breath when he walks to the front door. His been on nocturnal CPAP for the last 20 years and home oxygen therapy for over the last 1 year. He is a retired [employer] since [address]. He has 2 children and 2 grandchildren. He does not take alcohol. He used to be a heavy smoker about 3 packs per day for 30 years, but quit 4 years ago. He follows up with multiple physicians including cardiology, pulmonology, and a urologist.  Admission Physical Exam Blood pressure 111/44, pulse 57, temperature 98.2 F (36.8 C), temperature source Oral, resp. rate 20, SpO2 96.00%.  Constitutional: He is oriented to person, place, and time. He appears well-developed and well-nourished and morbidly obese.  HENT:  Head: Normocephalic and atraumatic.  Right Ear: External ear normal.  Left Ear: External ear normal.  Nose: Nose normal.  Mouth/Throat: Uvula is midline and oropharynx is clear and moist. Mucous membranes are dry.  Eyes: Conjunctivae normal are normal. Right eye exhibits no discharge. Left eye exhibits no discharge.  Neck: Normal range of motion. Neck supple. No JVD present.  Cardiovascular: Normal rate and regular rhythm.  Murmur heard.  Pulmonary/Chest: Effort normal and breath sounds normal. No respiratory distress. He has no wheezes. He has no rales.  Abdominal: Soft. Bowel sounds are normal. There is tenderness  (mild, exam limited by habitus) in the right lower quadrant, periumbilical area and suprapubic area. There is no rebound and no guarding.  Neurological: He is alert and oriented to person, place, and time. He has normal strength. No cranial nerve deficit or sensory deficit.  Skin: Skin is warm and dry.  Psychiatric: He has a normal mood and affect.  Admission Labs Basic Metabolic Panel:  Basename  07/13/12 1049   NA  136   K  4.4   CL  100   CO2  23   GLUCOSE  153*   BUN  17   CREATININE  1.32   CALCIUM  8.8   MG  --   PHOS  --    Liver Function Tests:  Basename  07/13/12 1049   AST  19   ALT  11   ALKPHOS  57   BILITOT  0.7   PROT  7.8   ALBUMIN  3.6    CBC:  Basename  07/13/12 1049   WBC  11.9*   NEUTROABS  10.8*   HGB  12.9*   HCT  40.9   MCV  90.3   PLT  170    Coagulation:  Basename  07/13/12 1049   LABPROT  23.5*   INR  2.[facility] Course by problem list: 1. Viral gastroenteritis - The patient presented with a 1-day history of diarrhea and mild abdominal pain, after contact with a granddaughter with similar symptoms, consistent with viral gastroenteritis.  Since the patient's baseline functional status is poor, given his obesity and severe COPD, the patient was deemed not safe to return home, and was admitted from the ED.  The patient was found to be c diff negative.  The patient was given IV fluids and a regular diet, and symptoms resolved by day 2 of hospitalization.  2. COPD -  The patient has a history of COPD, followed by [facility], on home O2.  The patient was continued on his home O2 and home medications, with no dyspnea.  3. Deconditioning - the patient was seen by PT and OT while inpatient, who identified significant deconditioning, and recommended a short stay in SNF.  After a long discussion about the risks/benefits of SNF, including the risks of falls at home and decreased mobility at home, the patient expressed understanding of the  risks of refusing SNF, but refused SNF, stating that he wanted to be home for Christmas.  The patient will receive home health PT and OT.  4. CAD - chronic, stable  5. Diabetes Mellitus type 2 - the patient experienced 1 episode of hypoglycemia during this hospitalization, related to not eating dinner one day.  The patient was counseled on eating regular meals while on insulin.  The patient's home insulin regimen was continued at discharge.  6. Chronic atrial fibrillation - the patient was continued on Warfarin.  INR remained therapeutic during his hospitalization.  7. Hypertension - chronic, stable  Time spent on discharge: 45 minutes  Discharge Vitals:  BP 130/74  Pulse 56  Temp 97.5 F (36.4 C) (Oral)  Resp 20  Ht 5' 11.5" (1.816 m)  Wt 304 lb 7.3 oz (138.1 kg)  BMI 41.87 kg/m2  SpO2 93%  Discharge Labs:  Results for orders placed during the hospital encounter of 07/13/12 (from the past 24 hour(s))  GLUCOSE, CAPILLARY     Status: Abnormal   Collection Time   07/14/12 12:03 PM      Component Value Range   Glucose-Capillary 120 (*) 70 - 99 mg/dL   Comment 1 Documented in Chart     Comment 2 Notify RN    GLUCOSE, CAPILLARY     Status: Normal   Collection Time   07/14/12  4:51 PM      Component Value Range   Glucose-Capillary 85  70 - 99 mg/dL   Comment 1 Documented in Chart     Comment 2 Notify RN    GLUCOSE, CAPILLARY     Status: Abnormal   Collection Time   07/14/12  9:59 PM      Component Value Range   Glucose-Capillary 54 (*) 70 - 99 mg/dL   Comment 1 Notify RN     Comment 2 Documented in Chart    GLUCOSE, CAPILLARY     Status: Abnormal   Collection Time   07/14/12 10:23 PM      Component Value Range   Glucose-Capillary 51 (*) 70 - 99 mg/dL   Comment 1 Notify RN     Comment 2 Documented in Chart    GLUCOSE, CAPILLARY     Status: Abnormal   Collection Time   07/14/12 11:10 PM      Component Value Range   Glucose-Capillary 56 (*) 70 - 99 mg/dL   Comment 1  Notify RN     Comment 2 Documented in Chart    GLUCOSE, CAPILLARY     Status: Abnormal   Collection Time   07/14/12 11:12 PM      Component Value Range   Glucose-Capillary 58 (*) 70 - 99 mg/dL  GLUCOSE, CAPILLARY     Status: Normal   Collection Time   07/14/12 11:40 PM      Component Value Range   Glucose-Capillary 96  70 - 99 mg/dL  CBC     Status: Abnormal   Collection Time   07/15/12  7:20 AM      Component Value Range   WBC 7.1  4.0 - 10.5 K/uL   RBC 3.96 (*) 4.22 - 5.81 MIL/uL   Hemoglobin 11.6 (*) 13.0 - 17.0 g/dL   HCT 27.2 (*) 53.6 - 64.4 %   MCV 90.9  78.0 - 100.0 fL   MCH 29.3  26.0 - 34.0 pg   MCHC 32.2  30.0 - 36.0 g/dL   RDW 03.4  74.2 - 59.5 %   Platelets 144 (*) 150 - 400 K/uL  BASIC METABOLIC PANEL     Status: Abnormal   Collection Time   07/15/12  7:20 AM      Component Value Range   Sodium 134 (*) 135 - 145 mEq/L   Potassium 3.8  3.5 - 5.1 mEq/L   Chloride 102  96 - 112 mEq/L   CO2 21  19 - 32 mEq/L   Glucose, Bld 60 (*) 70 - 99 mg/dL   BUN 20  6 - 23 mg/dL   Creatinine, Ser 6.38  0.50 - 1.35 mg/dL   Calcium 8.1 (*) 8.4 - 10.5 mg/dL   GFR calc non Af Amer 53 (*) >90 mL/min   GFR calc Af Amer 62 (*) >90 mL/min  PROTIME-INR     Status: Abnormal   Collection Time   07/15/12  7:20 AM      Component Value Range   Prothrombin Time 25.5 (*) 11.6 - 15.2 seconds   INR 2.46 (*) 0.00 - 1.49  GLUCOSE, CAPILLARY     Status: Normal   Collection Time   07/15/12  7:51 AM      Component Value Range   Glucose-Capillary 79  70 - 99 mg/dL    Signed: Janalyn Harder 07/15/2012, 10:15 AM

## 2012-07-15 NOTE — Progress Notes (Addendum)
Subjective: Overnight, the patient had hypoglycemia that was difficult to improve, in the setting of taking his 70/30 insulin and not eating dinner.  We discussed this morning the importance of consistently eating meals while taking insulin, and the patient expressed understanding.    PT/OT saw the patient yesterday and recommended SNF.  After a long conversation with the patient this morning regarding the risks/benefits of SNF, the patient decided to decline SNF placement, and prefers to go home.  As the patient is medically clear for discharge, we will plan to discharge home with [facility] PT/OT, per patient preference.  The patient's last bowel movement was yesterday morning, and he notes no diarrhea, nausea, vomiting, or abdominal pain overnight.  Viral gastroenteritis appears to have resolved.  Objective: Vital signs in last 24 hours: Filed Vitals:   07/14/12 2204 07/15/12 0500 07/15/12 0741 07/15/12 0800  BP:  122/63  130/74  Pulse: 121 55  56  Temp:  97.5 F (36.4 C)    TempSrc:  Oral    Resp: 22 20    Height:      Weight:  304 lb 7.3 oz (138.1 kg)    SpO2: 94% 91% 93%    Weight change: 3 lb 4.9 oz (1.5 kg)  Intake/Output Summary (Last 24 hours) at 07/15/12 0913 Last data filed at 07/14/12 2040  Gross per 24 hour  Intake 506.25 ml  Output    901 ml  Net -394.75 ml   Physical exam: General: alert, cooperative, and in no apparent distress HEENT: pupils equal round and reactive to light, vision grossly intact, oropharynx clear and non-erythematous  Neck: supple, no lymphadenopathy Lungs: clear to ascultation bilaterally, normal work of respiration, no wheezes, rales, ronchi Heart: regular rate and rhythm, no murmurs, gallops, or rubs Abdomen: soft, non-tender, non-distended, normal bowel sounds Extremities: no cyanosis, clubbing, or edema Neurologic: alert & oriented X3, cranial nerves II-XII intact, strength grossly intact, sensation intact to light touch  Lab Results: Basic  Metabolic Panel:  Lab 07/15/12 8119 07/14/12 0815  NA 134* 135  K 3.8 4.1  CL 102 102  CO2 21 23  GLUCOSE 60* 130*  BUN 20 19  CREATININE 1.29 1.26  CALCIUM 8.1* 8.4  MG -- --  PHOS -- --   Liver Function Tests:  Lab 07/13/12 1049  AST 19  ALT 11  ALKPHOS 57  BILITOT 0.7  PROT 7.8  ALBUMIN 3.6   CBC:  Lab 07/15/12 0720 07/14/12 0815 07/14/12 0540 07/13/12 1049  WBC 7.1 7.9 -- --  NEUTROABS -- -- 4.5 10.8*  HGB 11.6* 12.7* -- --  HCT 36.0* 39.5 -- --  MCV 90.9 92.7 -- --  PLT 144* 142* -- --   Cardiac Enzymes:  Lab 07/13/12 2141  CKTOTAL --  CKMB --  CKMBINDEX --  TROPONINI <0.30   CBG:  Lab 07/15/12 0751 07/14/12 2340 07/14/12 2312 07/14/12 2310 07/14/12 2223 07/14/12 2159  GLUCAP 79 96 58* 56* 51* 54*   Coagulation:  Lab 07/15/12 0720 07/14/12 0540 07/13/12 1049  LABPROT 25.5* 22.8* 23.5*  INR 2.46* 2.11* 2.20*   Anemia Panel: Urine Drug Screen: Drugs of Abuse   Urinalysis:  Lab 07/13/12 1227  COLORURINE YELLOW  LABSPEC 1.023  PHURINE 6.0  GLUCOSEU NEGATIVE  HGBUR SMALL*  BILIRUBINUR NEGATIVE  KETONESUR 15*  PROTEINUR 30*  UROBILINOGEN 1.0  NITRITE NEGATIVE  LEUKOCYTESUR NEGATIVE    Micro Results: Recent Results (from the past 240 hour(s))  CLOSTRIDIUM DIFFICILE BY PCR  Status: Normal   Collection Time   07/13/12  3:21 PM      Component Value Range Status Comment   C difficile by pcr NEGATIVE  NEGATIVE Final    Studies/Results: Dg Chest 2 View  07/13/2012  *RADIOLOGY REPORT*  Clinical Data: Cough and congestion.  CHEST - 2 VIEW  Comparison: 06/23/2011.  Findings: The heart is enlarged but stable.  The mediastinal and hilar contours are prominent but unchanged.  There is central vascular congestion and probable interstitial pulmonary edema.  No obvious pleural effusion on the lateral film.  IMPRESSION: Cardiac enlargement with vascular congestion and probable mild pulmonary edema.  No definite infiltrates.   Original Report  Authenticated By: Rudie Meyer, M.D.    Medications: I have reviewed the patient's current medications. Scheduled Meds:    . albuterol  2.5 mg Nebulization BID  . antiseptic oral rinse  15 mL Mouth Rinse q12n4p  . aspirin EC  81 mg Oral Daily  . atorvastatin  40 mg Oral q1800  . budesonide  0.5 mg Nebulization BID  . carvedilol  6.25 mg Oral QAC supper  . carvedilol  9.375 mg Oral QAC breakfast  . citalopram  40 mg Oral Daily  . insulin aspart protamine-insulin aspart  30 Units Subcutaneous BID WC  . isosorbide mononitrate  30 mg Oral Daily  . magnesium oxide  200 mg Oral Daily  . oxybutynin  5 mg Oral BID  . pantoprazole  40 mg Oral Daily  . primidone  100 mg Oral QHS  . primidone  50 mg Oral Daily  . spironolactone  25 mg Oral Daily  . warfarin  10 mg Oral Custom  . warfarin  5 mg Oral Custom  . Warfarin - Physician Dosing Inpatient   Does not apply q1800   Continuous Infusions:    . sodium chloride 75 mL/hr at 07/15/12 0040   PRN Meds:.acetaminophen, acetaminophen, albuterol, nitroGLYCERIN, ondansetron Assessment/Plan: Bruce Taylor is a 71 year old gentleman with multiple past medical history including congestive heart failure, COPD on home oxygen, morbidly obese, and 9 previous cardiac stents, status post pacemaker placement secondary to complete heart block, atrial fibrillation with chronic Coumadin use, who presented to the ED, with diarrhea for one day.   Viral gastroenteritis: Bruce Taylor presented with a one-day history of diarrhea, feeling weak, subjective fevers, and chills, associated with mild abdominal pain, consistent with viral gastroenteritis, likely contracted from contact with a sick granddaughter with similar symptoms.  C diff negative. -Diarrhea has now resolved (last BM yesterday)  HYPERTENSION: On presentation his blood pressure is not overtly low. Will hold his Lasix, but he'll continue with his lisinopril, and coreg.  -resume home medications at  discharge  COPD - chronic, stable, with OSA -Will continue with CPAP -continue home O2  Stable CAD: Abdominal pain on exam, but EKG unchanged and troponins negative -continue aspirin, coreg  DM: A1c November 2012, was 6.8%. He is on home insulin, lispro protamine, insulin 75/25 64 units twice daily.  -decrease insulin today given hypoglycemia last night and less than normal PO intake -discharge on home insulin  Chronic a-fib: Patient has chronic atrial fibrillation, and is on Coumadin. INR in the ED, is 2.2. -continue coumadin  Dispo:  Patient has decided to pursue SNF placement.  CSW has been consulted  The patient does have a current PCP (Daisy Floro, MD), therefore is not requiring OPC follow-up after discharge.  The patient does not have transportation limitations that hinder transportation to clinic appointments.  LOS: 2 days   Janalyn Harder 07/15/2012, 9:13 AM

## 2012-07-15 NOTE — Progress Notes (Signed)
Internal Medicine Attending  Date: 07/15/2012  Patient name: Bruce Taylor Medical record number: [id number] Date of birth: [birth year] Age: 71 y.o. Gender: male  I saw and evaluated the patient. I reviewed the resident's note by Dr. Manson Passey and I agree with the resident's findings and plans as documented in his progress note.  Mr. Ollinger feels good today stating his last bowel movement was yesterday. He denies any abdominal pain. Although SNF placement was recommended by Physical Therapy Mr. Thackston prefers to go home. He understands the risks and benefits of receiving home physical therapy rather than SNF placement. I agree with the housestaff's plan to discharge him home with followup in his primary care provider's office.

## 2012-07-15 NOTE — Progress Notes (Signed)
Physical Therapy Treatment Patient Details Name: JAQUALIN SERPA MRN: [id number] DOB: October 14, 1940 Today's Date: 07/15/2012 Time: 1555-1610 PT Time Calculation (min): 15 min  PT Assessment / Plan / Recommendation Comments on Treatment Session  Pt adm with diarrhea and fall.  Pt now c/o lt ankle pain with gait.  Pt making slow progress but feel he still needs ST-SNF.    Follow Up Recommendations  SNF     Does the patient have the potential to tolerate intense rehabilitation     Barriers to Discharge        Equipment Recommendations  None recommended by PT    Recommendations for Other Services    Frequency Min 3X/week   Plan Discharge plan remains appropriate;Frequency remains appropriate    Precautions / Restrictions Precautions Precautions: Fall   Pertinent Vitals/Pain Lt ankle pain.    Mobility  Bed Mobility Left Sidelying to Sit: 1: +2 Total assist Left Sidelying to Sit: Patient Percentage: 50% Sitting - Scoot to Edge of Bed: 4: Min assist Details for Bed Mobility Assistance: assist to move legs over and to bring trunk up. Transfers Sit to Stand: 1: +2 Total assist;From bed Sit to Stand: Patient Percentage: 70% Stand to Sit: 1: +2 Total assist;With armrests;With upper extremity assist;To chair/3-in-1 Stand to Sit: Patient Percentage: 80% Details for Transfer Assistance: assist to bring hips up Ambulation/Gait Ambulation/Gait Assistance: 1: +2 Total assist Ambulation/Gait: Patient Percentage: 80% Ambulation Distance (Feet): 10 Feet Assistive device: Rolling walker Ambulation/Gait Assistance Details: Pt c/o lt ankle pain with gait. Gait Pattern: Step-through pattern;Decreased stride length;Wide base of support Gait velocity: decr General Gait Details: dyspnea 3/4 with amb    Exercises     PT Diagnosis:    PT Problem List:   PT Treatment Interventions:     PT Goals Acute Rehab PT Goals PT Goal: Supine/Side to Sit - Progress: Progressing toward goal PT  Goal: Sit to Stand - Progress: Progressing toward goal PT Goal: Stand to Sit - Progress: Progressing toward goal PT Goal: Ambulate - Progress: Progressing toward goal  Visit Information  Last PT Received On: 07/15/12 Assistance Needed: +2    Subjective Data  Subjective: "Where have you been?"   Cognition  Overall Cognitive Status: Appears within functional limits for tasks assessed/performed Arousal/Alertness: Awake/alert Orientation Level: Appears intact for tasks assessed Behavior During Session: [facility] for tasks performed    Balance  Static Standing Balance Static Standing - Balance Support: Bilateral upper extremity supported Static Standing - Level of Assistance: 4: Min assist  End of Session PT - End of Session Activity Tolerance: Patient limited by fatigue Patient left: in chair;with call bell/phone within reach;with family/visitor present Nurse Communication: Mobility status   GP     Jaylee Freeze 07/15/2012, 4:30 PM  [employer] PT [phone]

## 2012-07-15 NOTE — Progress Notes (Signed)
Brief Interval Progress Note  I was called to the patient's room by his wife, who expressed concerns about the patient's decision for discharge home.  After a long discussion, the patient's wife and the patient have agreed to SNF placement.  I've placed a CSW consult for SNF placement.  The family prefers [facility] in [place].  I've cancelled the discharge order.  SignedJanalyn Harder, PGY2 07/15/2012, 12:54 PM

## 2012-07-16 DIAGNOSIS — R5381 Other malaise: Secondary | ICD-10-CM

## 2012-07-16 DIAGNOSIS — M722 Plantar fascial fibromatosis: Secondary | ICD-10-CM | POA: Diagnosis not present

## 2012-07-16 LAB — GLUCOSE, CAPILLARY
Glucose-Capillary: 119 mg/dL — ABNORMAL HIGH (ref 70–99)
Glucose-Capillary: 170 mg/dL — ABNORMAL HIGH (ref 70–99)
Glucose-Capillary: 183 mg/dL — ABNORMAL HIGH (ref 70–99)

## 2012-07-16 LAB — BASIC METABOLIC PANEL
CO2: 22 mEq/L (ref 19–32)
Calcium: 8.7 mg/dL (ref 8.4–10.5)
Chloride: 100 mEq/L (ref 96–112)
Creatinine, Ser: 1.27 mg/dL (ref 0.50–1.35)
Glucose, Bld: 79 mg/dL (ref 70–99)

## 2012-07-16 LAB — CBC
Hemoglobin: 11 g/dL — ABNORMAL LOW (ref 13.0–17.0)
MCH: 28.4 pg (ref 26.0–34.0)
MCV: 90.4 fL (ref 78.0–100.0)
Platelets: 141 10*3/uL — ABNORMAL LOW (ref 150–400)
RBC: 3.87 MIL/uL — ABNORMAL LOW (ref 4.22–5.81)
WBC: 7.6 10*3/uL (ref 4.0–10.5)

## 2012-07-16 MED ORDER — PREDNISONE 20 MG PO TABS
40.0000 mg | ORAL_TABLET | Freq: Every day | ORAL | Status: DC
  Filled 2012-07-16: qty 2

## 2012-07-16 MED ORDER — PREDNISONE 20 MG PO TABS
40.0000 mg | ORAL_TABLET | Freq: Every day | ORAL | Status: DC
  Administered 2012-07-16 – 2012-07-18 (×3): 40 mg via ORAL
  Filled 2012-07-16 (×4): qty 2

## 2012-07-16 NOTE — Progress Notes (Signed)
Internal Medicine Attending  Date: 07/16/2012  Patient name: Bruce Taylor Medical record number: [id number] Date of birth: June 02, 1941 Age: 71 y.o. Gender: male  I saw and evaluated the patient. I reviewed the resident's note by Dr. Anselm Jungling and I agree with the resident's findings and plans as documented in her progress note.  [name] is agreeable to transfer to a SNF for some rehab.  He notes left plantar pain with pressure on the foot.  Examination reveals left planter tenderness to palpation just distal to the heel as well as pain with squeezing the left foot from lateral and medial sides.  These findings are consistent with an acute plantar fasciitis.  Fortunately, his symptoms from his acute gastroenteritis have resolved.  Agree with the addition of prednisone for the acute plantar fasciitis and continued work on SNF placement.

## 2012-07-16 NOTE — Clinical Social Work Note (Addendum)
Clinical Social Work Department CLINICAL SOCIAL WORK PLACEMENT NOTE 07/16/2012  Patient:  Bruce Taylor, Bruce Taylor  Account Number:  [id number] Admit date:  07/13/2012  Clinical Social Worker:  Johnsie Cancel  Date/time:  07/16/2012 04:37 PM  Clinical Social Work is seeking post-discharge placement for this patient at the following level of care:   SKILLED NURSING   (*CSW will update this form in Epic as items are completed)   07/16/2012  Patient/family provided with [facility] Department of Clinical Social Work's list of facilities offering this level of care within the geographic area requested by the patient (or if unable, by the patient's family).  07/16/2012  Patient/family informed of their freedom to choose among providers that offer the needed level of care, that participate in Medicare, Medicaid or managed care program needed by the patient, have an available bed and are willing to accept the patient.  07/16/2012  Patient/family informed of [facility]' ownership interest in [facility], as well as of the fact that they are under no obligation to receive care at this facility.  07/16/2012 PASARR submitted to EDS on  07/16/2012 PASARR number received from EDS on   07/16/2012 FL2 transmitted to all facilities in geographic area requested by pt/family FL2 transmitted to all facilities within larger geographic area on 07/17/12 Patient informed that his/her managed care company has contracts with or will negotiate with  certain facilities, including the following:     Patient/family informed of bed offers received:  07/17/12 Patient chooses bed at [facility] Physician recommends and patient chooses bed at    Patient to be transferred to  on  07/18/2012 Patient to be transferred to facility by [facility]  The following physician request were entered in Epic:   Additional Comments:  Lia Foyer, LCSWA [facility] Clinical Social  Worker Contact #: [phone] (sign)

## 2012-07-16 NOTE — ED Provider Notes (Signed)
Medical screening examination/treatment/procedure(s) were performed by non-physician practitioner and as supervising physician I was immediately available for consultation/collaboration.   Loren Racer, MD 07/16/12 [phone]

## 2012-07-16 NOTE — Clinical Social Work Psychosocial (Signed)
Clinical Social Work Department BRIEF PSYCHOSOCIAL ASSESSMENT 07/16/2012  Patient:  Bruce Taylor, Bruce Taylor     Account Number:  [id number]     Admit date:  07/13/2012  Clinical Social Worker:  Johnsie Cancel  Date/Time:  07/16/2012 04:06 PM  Referred by:  Physician  Date Referred:  07/14/2012 Referred for  SNF Placement   Other Referral:   Interview type:  Patient Other interview type:   Wife: Bruce Taylor [phone])    PSYCHOSOCIAL DATA Living Status:  WIFE Primary support name:  Bruce Taylor Primary support relationship to patient:  SPOUSE Degree of support available:   Adequate, at patient's bedside.    CURRENT CONCERNS Current Concerns  Post-Acute Placement   Other Concerns:    SOCIAL WORK ASSESSMENT / PLAN CSW consulted by MD re: placement for SNF. CSW comepleted chart review and spoke to the patient and wife to discuss options. Patient requested spouse be the person making SNF decisions. Spouse stated her number 1 choice is [facility] in [place], and [facility] search as a backup. Spouse stated the patient needed rehab due to her currently having stage 1V cancer. CSW provided support and explained insurance coverage for SNF due to this being a concern of the spouse. Weekday CSW will continue to follow patient.   Assessment/plan status:  Information/Referral to [facility] Other assessment/ plan:   Information/referral to community resources:    PATIENT'S/FAMILY'S RESPONSE TO PLAN OF CARE: Patient and patient's spouse thanked CSW for support and providing education on the SNF process.    Lia Foyer, LCSWA [facility] Clinical Social Worker Contact #: [phone] (weekend)

## 2012-07-16 NOTE — Progress Notes (Addendum)
Subjective: He states that his diarrhea is now resolved and denies any abdominal pain.  He is complaining of left plantar/foot pain especially when he puts pressure on his left foot.  He states that this has been going on for a while and has mentioned it to several doctors in the past and nothing has been done.  He is agreeable to SNF only if it is close to his home.    Objective: Vital signs in last 24 hours: Filed Vitals:   07/15/12 2106 07/15/12 2108 07/15/12 2122 07/16/12 0547  BP:   129/83 136/76  Pulse:  68 57 70  Temp:   98.6 F (37 C) 98.3 F (36.8 C)  TempSrc:   Oral Oral  Resp:  19 20 20   Height:      Weight:    313 lb 4.4 oz (142.1 kg)  SpO2: 96% 97% 95% 96%   Weight change: 8 lb 13.1 oz (4 kg)  Intake/Output Summary (Last 24 hours) at 07/16/12 0738 Last data filed at 07/16/12 0548  Gross per 24 hour  Intake    243 ml  Output   1700 ml  Net  -1457 ml   General: alert and was wearing CPAP, and cooperative to examination.   Lungs: normal respiratory effort, no accessory muscle use, decreased breath sounds at bases when listened anteriorly, no crackles, and no wheezes.  Heart: normal rate, regular rhythm, no murmur, no gallop, and no rub.  Abdomen: soft, obese, non-tender, normal bowel sounds, no distention, no guarding, no rebound tenderness Pulses: 2+ DP/PT pulses bilaterally Extremities: left foot: tender to palpation of left plantar and lateral aspect. No erythema or drainage noted. Left ankle: good range of motion.   Neurologic: nonfocal.   Lab Results: Basic Metabolic Panel:  Lab 07/15/12 9604 07/14/12 0815  NA 134* 135  K 3.8 4.1  CL 102 102  CO2 21 23  GLUCOSE 60* 130*  BUN 20 19  CREATININE 1.29 1.26  CALCIUM 8.1* 8.4  MG -- --  PHOS -- --   Liver Function Tests:  Lab 07/13/12 1049  AST 19  ALT 11  ALKPHOS 57  BILITOT 0.7  PROT 7.8  ALBUMIN 3.6   CBC:  Lab 07/15/12 0720 07/14/12 0815 07/14/12 0540 07/13/12 1049  WBC 7.1 7.9 -- --   NEUTROABS -- -- 4.5 10.8*  HGB 11.6* 12.7* -- --  HCT 36.0* 39.5 -- --  MCV 90.9 92.7 -- --  PLT 144* 142* -- --   Cardiac Enzymes:  Lab 07/13/12 2141  CKTOTAL --  CKMB --  CKMBINDEX --  TROPONINI <0.30   CBG:  Lab 07/15/12 2119 07/15/12 1714 07/15/12 1156 07/15/12 0751 07/14/12 2340 07/14/12 2312  GLUCAP 113* 177* 100* 79 96 58*   Coagulation:  Lab 07/15/12 0720 07/14/12 0540 07/13/12 1049  LABPROT 25.5* 22.8* 23.5*  INR 2.46* 2.11* 2.20*    Urinalysis:  Lab 07/13/12 1227  COLORURINE YELLOW  LABSPEC 1.023  PHURINE 6.0  GLUCOSEU NEGATIVE  HGBUR SMALL*  BILIRUBINUR NEGATIVE  KETONESUR 15*  PROTEINUR 30*  UROBILINOGEN 1.0  NITRITE NEGATIVE  LEUKOCYTESUR NEGATIVE   Micro Results: Recent Results (from the past 240 hour(s))  CLOSTRIDIUM DIFFICILE BY PCR     Status: Normal   Collection Time   07/13/12  3:21 PM      Component Value Range Status Comment   C difficile by pcr NEGATIVE  NEGATIVE Final    Studies/Results: No results found. Medications: reviewed Scheduled Meds:   . albuterol  2.5 mg Nebulization BID  . antiseptic oral rinse  15 mL Mouth Rinse q12n4p  . aspirin EC  81 mg Oral Daily  . atorvastatin  40 mg Oral q1800  . budesonide  0.5 mg Nebulization BID  . citalopram  40 mg Oral Daily  . insulin aspart protamine-insulin aspart  30 Units Subcutaneous BID WC  . isosorbide mononitrate  30 mg Oral Daily  . magnesium oxide  200 mg Oral Daily  . oxybutynin  5 mg Oral BID  . pantoprazole  40 mg Oral Daily  . primidone  100 mg Oral QHS  . primidone  50 mg Oral Daily  . sodium chloride  3 mL Intravenous Q12H  . spironolactone  25 mg Oral Daily  . warfarin  10 mg Oral Custom  . warfarin  5 mg Oral Custom  . 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   Viral gastroenteritis: symptoms resolved. C diff negative.   Left plantar fasciitis: new finding per our exam this morning although he stated that he mentioned it to multiple physicians in the past.  He does have pain on plantar and lateral aspects of his left foot, ROM of left ankle is normal.  He has pain on first step in the morning and when he put pressure on that foot.  Given that his Cr is elevated at 1.27, I will avoid NSAID and will start PO prednisone tapering dose to decrease inflammation.   -Start Prednisone 40mg    HYPERTENSION: stable at 136/76 this morning. Will continue with his lisinopril, and coreg.   COPD - chronic, stable, with OSA  -Will continue with CPAP  -continue home O2   Stable ZOX:WRUEAV -continue aspirin, coreg   DM: A1c November 2012, was 6.8%. He is on home insulin, lispro protamine, insulin 75/25 64 units twice daily.  -Novolog 70/30, 30 units BID but will hold this AM dose because his CBG was 81 this morning.  He may need higher dose of insulin since we are starting him on steroids today for his plantar fasciitis. Will continue to monitor his CBG and re-adjust his regimen accordingly.  Chronic a-fib: Patient has chronic atrial fibrillation, and is on Coumadin. INR therapeutic at 2.46 on 12/21.  -continue coumadin   Dispo: Awaiting for SNF placement.  Anticipated discharge in approximately 1-2 day(s).   Patient has decided to pursue SNF placement. CSW has been consulted  The patient does have a current PCP (Bruce Floro, MD), therefore is not requiring OPC follow-up after discharge.  The patient does not have transportation limitations that hinder  transportation to clinic appointments.   .Services Needed at time of discharge: Y = Yes, Blank = No PT: Y  OT: N  RN: N  Equipment: N  Other:     LOS: 3 days   Bruce Taylor 07/16/2012, 7:38 AM

## 2012-07-17 LAB — PROTIME-INR
INR: 2.38 — ABNORMAL HIGH (ref 0.00–1.49)
Prothrombin Time: 24.9 seconds — ABNORMAL HIGH (ref 11.6–15.2)

## 2012-07-17 LAB — CBC
HCT: 38.2 % — ABNORMAL LOW (ref 39.0–52.0)
Platelets: 177 10*3/uL (ref 150–400)
RBC: 4.28 MIL/uL (ref 4.22–5.81)
RDW: 14.9 % (ref 11.5–15.5)
WBC: 7.5 10*3/uL (ref 4.0–10.5)

## 2012-07-17 LAB — GLUCOSE, CAPILLARY
Glucose-Capillary: 168 mg/dL — ABNORMAL HIGH (ref 70–99)
Glucose-Capillary: 233 mg/dL — ABNORMAL HIGH (ref 70–99)
Glucose-Capillary: 243 mg/dL — ABNORMAL HIGH (ref 70–99)

## 2012-07-17 LAB — BASIC METABOLIC PANEL
CO2: 25 mEq/L (ref 19–32)
Chloride: 98 mEq/L (ref 96–112)
GFR calc Af Amer: 70 mL/min — ABNORMAL LOW (ref >90)
Potassium: 4.3 mEq/L (ref 3.5–5.1)

## 2012-07-17 MED ORDER — PREDNISONE 10 MG PO TABS: 40.0000 mg | ORAL_TABLET | Freq: Every day | ORAL | Status: DC

## 2012-07-17 NOTE — Progress Notes (Signed)
Subjective: He states that his diarrhea is now resolved and denies any abdominal pain. The left foot pain is better after starting prednisolone.  No new complaints and he is looking forward to going to SNF today.  Objective: Vital signs in last 24 hours: Filed Vitals:   07/16/12 1428 07/16/12 2209 07/16/12 2218 07/17/12 0524  BP: 154/75  145/70 159/77  Pulse: 55 92 57 60  Temp: 98.1 F (36.7 C)  98.5 F (36.9 C) 98.1 F (36.7 C)  TempSrc: Oral  Oral Oral  Resp: 20 24 20 18   Height:      Weight:    302 lb 14.6 oz (137.4 kg)  SpO2: 91% 93% 92% 92%   Weight change: -10 lb 5.8 oz (-4.7 kg)  Intake/Output Summary (Last 24 hours) at 07/17/12 0817 Last data filed at 07/17/12 0527  Gross per 24 hour  Intake      0 ml  Output   1200 ml  Net  -1200 ml   General: alert and was wearing CPAP, and cooperative to examination.   Lungs: normal respiratory effort, no accessory muscle use,  no crackles, and no wheezes.  Heart: normal rate, regular rhythm, no murmur, no gallop, and no rub.  Abdomen: soft, obese, non-tender, normal bowel sounds, no distention, no guarding, no rebound tenderness Pulses: 2+ DP/PT pulses bilaterally Extremities: Pressing the left foot moderately hard today does not hurt. No sings of inflammation at all.  Neurologic: nonfocal.   Lab Results: Basic Metabolic Panel:  Lab 07/17/12 8413 07/16/12 0605  NA 132* 134*  K 4.3 4.1  CL 98 100  CO2 25 22  GLUCOSE 165* 79  BUN 17 18  CREATININE 1.17 1.27  CALCIUM 9.4 8.7  MG -- --  PHOS -- --   Liver Function Tests:  Lab 07/13/12 1049  AST 19  ALT 11  ALKPHOS 57  BILITOT 0.7  PROT 7.8  ALBUMIN 3.6   CBC:  Lab 07/17/12 0530 07/16/12 0605 07/14/12 0540 07/13/12 1049  WBC 7.5 7.6 -- --  NEUTROABS -- -- 4.5 10.8*  HGB 12.7* 11.0* -- --  HCT 38.2* 35.0* -- --  MCV 89.3 90.4 -- --  PLT 177 141* -- --   Cardiac Enzymes:  Lab 07/13/12 2141  CKTOTAL --  CKMB --  CKMBINDEX --  TROPONINI <0.30    CBG:  Lab 07/17/12 0729 07/16/12 2221 07/16/12 1653 07/16/12 1224 07/16/12 0736 07/15/12 2119  GLUCAP 168* 183* 170* 119* 81 113*   Coagulation:  Lab 07/17/12 0530 07/15/12 0720 07/14/12 0540 07/13/12 1049  LABPROT 24.9* 25.5* 22.8* 23.5*  INR 2.38* 2.46* 2.11* 2.20*    Urinalysis:  Lab 07/13/12 1227  COLORURINE YELLOW  LABSPEC 1.023  PHURINE 6.0  GLUCOSEU NEGATIVE  HGBUR SMALL*  BILIRUBINUR NEGATIVE  KETONESUR 15*  PROTEINUR 30*  UROBILINOGEN 1.0  NITRITE NEGATIVE  LEUKOCYTESUR NEGATIVE   Micro Results: Recent Results (from the past 240 hour(s))  CLOSTRIDIUM DIFFICILE BY PCR     Status: Normal   Collection Time   07/13/12  3:21 PM      Component Value Range Status Comment   C difficile by pcr NEGATIVE  NEGATIVE Final    Studies/Results: No results found. Medications: reviewed Scheduled Meds:    . albuterol  2.5 mg Nebulization BID  . antiseptic oral rinse  15 mL Mouth Rinse q12n4p  . aspirin EC  81 mg Oral Daily  . atorvastatin  40 mg Oral q1800  . budesonide  0.5 mg Nebulization BID  .  citalopram  40 mg Oral Daily  . insulin aspart protamine-insulin aspart  30 Units Subcutaneous BID WC  . isosorbide mononitrate  30 mg Oral Daily  . magnesium oxide  200 mg Oral Daily  . oxybutynin  5 mg Oral BID  . pantoprazole  40 mg Oral Daily  . predniSONE  40 mg Oral Q breakfast  . primidone  100 mg Oral QHS  . primidone  50 mg Oral Daily  . sodium chloride  3 mL Intravenous Q12H  . spironolactone  25 mg Oral Daily  . warfarin  10 mg Oral Custom  . warfarin  5 mg Oral Custom  . Warfarin - Physician Dosing Inpatient   Does not apply q1800   Continuous Infusions:  PRN Meds:.acetaminophen, acetaminophen, albuterol, nitroGLYCERIN, ondansetron Assessment/Plan: [name] is a 71 year old gentleman with multiple past medical history including congestive heart failure, COPD on home oxygen, morbidly obese, and 9 previous cardiac stents, status post pacemaker  placement secondary to complete heart block, atrial fibrillation with chronic Coumadin use, who presented to the ED, with diarrhea for one day now c/o left plantar pain.   Viral gastroenteritis: symptoms resolved. C diff negative.   Left plantar fasciitis: Mr Sol Passer complained of severe pain involving the bottom of his left foot. He was very concern about this pain since it had been recurrent but he had never received any treatment for it even though he stated that he mentioned it to multiple physicians in the past.  Physical exam was consistent with plantar fascitis. He responded to oral prednisone after just one dose. Given that his Cr was elevated at 1.27, we avoided NSAIDS. He was discharged on a tapering dose of  PO prednisone tapering dose to decrease inflammation.   -Start Prednisone 40mg    HYPERTENSION: His blood pressure remained stable throughout his hospitalization. He continued with his home lisinopril, and coreg. His furosemide was withheld. This can be restarted after the diarrhea has completely resolved.    COPD: His COPD remained stable. He continued with home oxygen therapy and with CPAP for OSA   Diabetes Type 2: A1c November 2012, was 6.8%. His home insulin regimen includes lispro protamine, insulin 75/25 64 units twice daily. During his hospitalization we started him on Novolog 70/30, 30 units BID and increased the dose of insulin since after starting the him on steroids to treat the plantar fasciitis. His CBGs remained stable overall. He was discharged on his home dose.    Chronic a-fib: Patient has chronic atrial fibrillation, and on chronic Coumadin. INR remained therapeutic during his entire stay in the hospital.  Disposition: Mr Sol Passer required SNF due to decondition and foot pain. The wife and himself are agreeable to him having some rehab before he can fully return home.  Patient has decided to pursue SNF placement. CSW has been consulted  The patient does have a  current PCP (Daisy Floro, MD), therefore is not requiring OPC follow-up after discharge.  The patient does not have transportation limitations that hinder transportation to clinic appointments.   .Services Needed at time of discharge: Y = Yes, Blank = No PT: Y  OT: N  RN: N  Equipment: N  Other:     LOS: 4 days   Dow Adolph 07/17/2012, 8:17 AM

## 2012-07-17 NOTE — Progress Notes (Signed)
Pt was placed on CPAP Auto-mode of 7 - 20 cmH2O. Pt has a full face large mask. Vitals are stable HR 58 RR 18. Oxygen saturation of 96% with 3 LPM bled in. Pt is comfortable and RT will continue to monitor.

## 2012-07-17 NOTE — Progress Notes (Signed)
Clinical Social Work  CSW met with patient and wife at bedside. CSW offered bed offers for [facility]. Patient interested in expanding search to [facility] as well. CSW expanded search and will continue to follow.  [place], [place] [phone]

## 2012-07-17 NOTE — Progress Notes (Signed)
Internal Medicine Attending  Date: 07/17/2012  Patient name: Bruce Taylor Medical record number: [id number] Date of birth: 05/08/1941 Age: 71 y.o. Gender: male  I saw and evaluated the patient. I reviewed the resident's note by Dr. Zada Girt and I agree with the resident's findings and plans as documented in his progress note.  [name] had a good night. He denies any recurrence of his diarrhea, and his left foot pain secondary to the planter fasciitis is much improved after starting prednisone. We continue to wait for skilled nursing facility placement and social work continues to pursue such. In the meantime, we'll continue his chronic medical therapy as outlined in Dr. Huntley Dec note.

## 2012-07-17 NOTE — Discharge Summary (Signed)
Internal Medicine Teaching [facility] Discharge Note  Name: Bruce Taylor MRN: [id number] DOB: 06/24/1941 71 y.o.  Date of Admission: 07/13/2012 10:13 AM Date of Discharge: 07/18/2012 Attending Physician: Rocco Serene, MD  Discharge Diagnosis: Principal Problem:  *Physical deconditioning Active Problems:  HYPERTENSION  Chronic respiratory failure  CAD (coronary artery disease)  Obesity, morbid  DM (diabetes mellitus), type 2 with complications  Chronic a-fib  Gastroenteritis  Plantar fasciitis of left foot  Hyponatremia   Discharge Medications:   Medication List     As of 07/18/2012  2:16 PM    STOP taking these medications         furosemide 40 MG tablet   Commonly known as: LASIX      spironolactone 25 MG tablet   Commonly known as: ALDACTONE      TAKE these medications         aspirin EC 81 MG tablet   Take 81 mg by mouth daily.      budesonide 0.5 MG/2ML nebulizer solution   Commonly known as: PULMICORT   Take 0.5 mg by nebulization 2 (two) times daily.      carvedilol 6.25 MG tablet   Commonly known as: COREG   Take 6.25-9.375 mg by mouth 2 (two) times daily with a meal. Take 1 1/2 tabs each morning and 1 tab each evening      citalopram 20 MG tablet   Commonly known as: CELEXA   Take 1 tablet (20 mg total) by mouth daily.      insulin lispro protamine-insulin lispro (75-25) 100 UNIT/ML Susp   Commonly known as: HUMALOG 75/25   Inject 64 Units into the skin 2 (two) times daily with a meal.      isosorbide mononitrate 30 MG 24 hr tablet   Commonly known as: IMDUR   Take 30 mg by mouth daily.      Magnesium 250 MG Tabs   Take 250 mg by mouth daily.      nitroGLYCERIN 0.4 MG SL tablet   Commonly known as: NITROSTAT   Place 0.4 mg under the tongue every 5 (five) minutes as needed. For chest pain      oxybutynin 5 MG tablet   Commonly known as: DITROPAN   Take 5 mg by mouth 2 (two) times daily.      pantoprazole 40 MG tablet    Commonly known as: PROTONIX   Take 40 mg by mouth daily.      predniSONE 10 MG tablet   Commonly known as: DELTASONE   Take 4 tablets (40 mg total) by mouth daily with breakfast.      primidone 50 MG tablet   Commonly known as: MYSOLINE   Take 50-100 mg by mouth 2 (two) times daily. 1 tab in the morning and 2 tabs at night      PROAIR HFA 108 (90 BASE) MCG/ACT inhaler   Generic drug: albuterol   Inhale 1-2 puffs into the lungs every 6 (six) hours as needed. For shortness of breath      albuterol (2.5 MG/3ML) 0.083% nebulizer solution   Commonly known as: PROVENTIL   Take 2.5 mg by nebulization 2 (two) times daily.      simvastatin 80 MG tablet   Commonly known as: ZOCOR   Take 40 mg by mouth Daily. Take 1/2 tab at night      warfarin 10 MG tablet   Commonly known as: COUMADIN   Take 10 mg by mouth See admin  instructions. Takes 10mg  (1 tab) on Tuesday, Wednesday, Thursday, Saturday, Sunday      warfarin 5 MG tablet   Commonly known as: COUMADIN   Take 5 mg by mouth See admin instructions. Takes 5mg  (1 tab) on Monday and Friday          Disposition and follow-up:   Bruce Taylor was discharged from [facility] in stable condition.    At the hospital follow up visit please address:  -Please check his BMET and consider restarting his lasix and spironolactone. He was a little volume contracted on the day of discharge with low sodium level and high BUN.  -Please review Bruce Taylor volume status including his blood pressure and make changes to his medications especially the furosemide and spironolactone as fit. - Please assess patient for left foot pain due to plantar fascitis - Please evaluate the patient for his other medication conditions including Diabetes, COPD, OSA, and atrial fibrillation. - Bruce Taylor was discharged to rehab (SNF)  Follow-up Appointments: Follow-up Information    Follow up with Daisy Floro, MD. On 07/27/2012. (12:15  pm)    Contact information:   [address] [place] [address] [phone]         Discharge Orders    Future Appointments: Provider: Department: Dept Phone: Center:   12/08/2012 10:30 AM Barbaraann Share, MD [facility] [phone] None     Future Orders Please Complete By Expires   Diet Carb Modified      Diet - low sodium heart healthy      Diet - low sodium heart healthy      Increase activity slowly      Discharge instructions      Comments:   You were hospitalized with Viral Gastroenteritis (aka Stomach Flu).  Your symptoms have now resolved.  To prevent this from happening in the future, be sure to wash your hands thoroughly after coming in contact with others who may have this illness.   Call MD for:  persistant nausea and vomiting      Call MD for:  temperature >100.4      Call MD for:  severe uncontrolled pain      Increase activity slowly      Call MD for:  temperature >100.4      Call MD for:  persistant nausea and vomiting      Call MD for:  persistant dizziness or light-headedness      Increase activity slowly      Call MD for:  temperature >100.4      Call MD for:  difficulty breathing, headache or visual disturbances      Call MD for:  persistant dizziness or light-headedness      Call MD for:  extreme fatigue      (HEART FAILURE PATIENTS) Call MD:  Anytime you have any of the following symptoms: 1) 3 pound weight gain in 24 hours or 5 pounds in 1 week 2) shortness of breath, with or without a dry hacking cough 3) swelling in the hands, feet or stomach 4) if you have to sleep on extra pillows at night in order to breathe.         Consultations:  none Procedures Performed:  Dg Chest 2 View  07/13/2012  *RADIOLOGY REPORT*  Clinical Data: Cough and congestion.  CHEST - 2 VIEW  Comparison: 06/23/2011.  Findings: The heart is enlarged but stable.  The mediastinal and hilar contours are prominent but  unchanged.  There is central vascular congestion  and probable interstitial pulmonary edema.  No obvious pleural effusion on the lateral film.  IMPRESSION: Cardiac enlargement with vascular congestion and probable mild pulmonary edema.  No definite infiltrates.   Original Report Authenticated By: Rudie Meyer, M.D.     Admission HPI:  Bruce. 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 He reported that earlier that day he developed an episode of diarrhea, which was associated with weakness. He described his stools as watery without blood and without foul smell. It looked yellow in color. He denied history of abdominal pain. He reported history of fevers, even though he did not take his body temperature at home. He denied history of vomiting, however, he reported feeling nauseous. His appetite was normal. His granddaughter, who had visited them the previous day had some stomach aches, and flulike symptoms, but did not have diarrhea or vomiting. She had been brought over after she was unwell and could not go to school. No one else in the family had experienced similar symptoms. He also reported that he felt weak in the morning and when he tried to stand up his legs give way before he fell down. He did not hit his head and denied loss of consciousness, no history of localised weakness in his extremities. He has chronic tremors involving the right arm mainly. His wife called EMS and the patient was brought to the ED. Prior to onset of diarrhea, the patient has been in his usual state of health. The history was provided by the patient and supplemented by his spouse who was at bedside. At baseline, Bruce. Bruce Taylor is only able to walk very minimally around his house using a walker.  He gets short of breath when he walks to the front door. He had been on nocturnal CPAP for the last 20 years and home oxygen therapy for over the last 1 year. He is a retired [employer] since [employer]. He has 2 children and 2 grandchildren. He does not take alcohol. He used to be a heavy smoker about 3 packs per day for 30 years, but quit 4 years ago. He follows up with multiple physicians including cardiology, pulmonology, and a urologist. He has a primary care physician who he does not see very often because his other doctors usually take care of his health needs.  Review of Systems:  Constitutional: Positive for fever, chills and fatigue.  HENT: Positive for sore throat and rhinorrhea. Negative for neck pain.  Eyes: Negative for redness.  Respiratory: Positive for cough. Negative for shortness of breath.  Cardiovascular: Positive for chest pain. Negative for palpitations and leg swelling.  Gastrointestinal: Positive for abdominal pain and diarrhea. Negative for nausea and vomiting.  Genitourinary: Negative for dysuria and decreased urine volume.  Musculoskeletal: Negative for myalgias.  Skin: Negative for rash.  Neurological: Negative for headaches.  Psychiatric/Behavioral: Positive for confusion.  Physical Exam:  Blood pressure 111/44, pulse 57, temperature 98.2 F (36.8 C), temperature source Oral, resp. rate 20, SpO2 96.00%.  Constitutional: He is oriented to person, place, and time. He appears well-developed and well-nourished and morbidly obese.  HENT:  Head: Normocephalic and atraumatic.  Right Ear: External ear normal.  Left Ear: External ear normal.  Nose:  Nose normal.  Mouth/Throat: Uvula is midline and oropharynx is clear and moist. Mucous membranes are dry.  Eyes: Conjunctivae normal are normal. Right eye exhibits no discharge. Left eye exhibits no discharge.  Neck: Normal range of motion. Neck supple. No JVD present.  Cardiovascular: Normal rate and regular rhythm.  Murmur  heard.  Pulmonary/Chest: Effort normal and breath sounds normal. No respiratory distress. He has no wheezes. He has no rales.  Abdominal: Soft. Bowel sounds are normal. There is tenderness (mild, exam limited by habitus) in the right lower quadrant, periumbilical area and suprapubic area. There is no rebound and no guarding.  Neurological: He is alert and oriented to person, place, and time. He has normal strength. No cranial nerve deficit or sensory deficit.  Skin: Skin is warm and dry.  Psychiatric: He has a normal mood and affect.  Hospital Course by problem list: Viral gastroenteritis: Bruce. Bruce Taylor presented with a one-day history of diarrhea, feeling weak, subjective fevers, and chills, associated with mild abdominal pain, consistent with viral gastroenteritis, likely contracted from contact with a sick granddaughter with similar symptoms. C diff was negative. His diarrhea improved after 2 days in the hospital. He was treated with IV fluids and his regular furosemide was withheld. At the time of discharge, he was having just minimal loose motions.  Left plantar fasciitis: Bruce Taylor complained of severe pain involving the bottom of his left foot. He was very concern about this pain since it had been recurrent but he had never received any treatment for it even though he stated that he mentioned it to multiple physicians in the past. Physical exam was consistent with plantar fascitis. He responded to oral prednisone after just one dose. Given that his Cr was elevated at 1.27, we avoided NSAIDS. He was discharged on a tapering dose of PO prednisone tapering dose to decrease inflammation. He has been provided with information on this disease to continue with home management. Hyponatremia: He has been having ongoing hyponatremia with the most recent sodium level being 132. His BUN is also slightly elevated from 17 to 24 in the last 24 hours. This is most likely coming from dehydration and combined with a  reduced oral intake. He was given a bolus of 500 cc of normal saline but did not improve Na and BUN. His home lasix and spironolactone withheld on discharge. These can be restarted after another basic metabolic panel check in a few days at [facility]. HYPERTENSION: His blood pressure remained stable throughout his hospitalization. He continued with his home lisinopril, and coreg. His furosemide was withheld. This can be restarted after the diarrhea has completely resolved.  COPD: His COPD remained stable. He continued with home oxygen therapy and with CPAP for OSA  Diabetes Type 2: A1c November 2012, was 6.8%. His home insulin regimen includes lispro protamine, insulin 75/25 64 units twice daily. During his hospitalization we started him on Novolog 70/30, 30 units BID and increased the dose of insulin since after starting the him on steroids to treat the plantar fasciitis. His CBGs remained stable overall. He was discharged on his home dose.  Chronic a-fib: Patient has chronic atrial fibrillation, and on chronic Coumadin. INR remained therapeutic during his entire stay in the hospital.   Disposition: Bruce Taylor required SNF due to deconditioning and foot pain. The wife and himself were agreeable to him having some rehab before he can fully return home.    Discharge Vitals:  BP 161/71  Pulse 59  Temp 97.5 F (36.4  C) (Oral)  Resp 18  Ht 5' 11.5" (1.816 m)  Wt 297 lb 2.9 oz (134.8 kg)  BMI 40.87 kg/m2  SpO2 94%  Discharge Labs:  Results for orders placed during the hospital encounter of 07/13/12 (from the past 24 hour(s))  GLUCOSE, CAPILLARY     Status: Abnormal   Collection Time   07/17/12  5:20 PM      Component Value Range   Glucose-Capillary 233 (*) 70 - 99 mg/dL  GLUCOSE, CAPILLARY     Status: Abnormal   Collection Time   07/17/12  9:45 PM      Component Value Range   Glucose-Capillary 243 (*) 70 - 99 mg/dL   Comment 1 Notify RN     Comment 2 Documented in Chart    CBC     Status:  Abnormal   Collection Time   07/18/12  5:44 AM      Component Value Range   WBC 8.0  4.0 - 10.5 K/uL   RBC 4.29  4.22 - 5.81 MIL/uL   Hemoglobin 12.6 (*) 13.0 - 17.0 g/dL   HCT 65.7 (*) 84.6 - 96.2 %   MCV 88.8  78.0 - 100.0 fL   MCH 29.4  26.0 - 34.0 pg   MCHC 33.1  30.0 - 36.0 g/dL   RDW 95.2  84.1 - 32.4 %   Platelets 209  150 - 400 K/uL  BASIC METABOLIC PANEL     Status: Abnormal   Collection Time   07/18/12  5:44 AM      Component Value Range   Sodium 131 (*) 135 - 145 mEq/L   Potassium 4.3  3.5 - 5.1 mEq/L   Chloride 96  96 - 112 mEq/L   CO2 22  19 - 32 mEq/L   Glucose, Bld 194 (*) 70 - 99 mg/dL   BUN 24 (*) 6 - 23 mg/dL   Creatinine, Ser 4.01  0.50 - 1.35 mg/dL   Calcium 9.3  8.4 - 02.7 mg/dL   GFR calc non Af Amer 57 (*) >90 mL/min   GFR calc Af Amer 66 (*) >90 mL/min  PROTIME-INR     Status: Abnormal   Collection Time   07/18/12  5:44 AM      Component Value Range   Prothrombin Time 25.7 (*) 11.6 - 15.2 seconds   INR 2.48 (*) 0.00 - 1.49  GLUCOSE, CAPILLARY     Status: Abnormal   Collection Time   07/18/12  7:19 AM      Component Value Range   Glucose-Capillary 156 (*) 70 - 99 mg/dL  BASIC METABOLIC PANEL     Status: Abnormal   Collection Time   07/18/12 11:48 AM      Component Value Range   Sodium 132 (*) 135 - 145 mEq/L   Potassium 4.3  3.5 - 5.1 mEq/L   Chloride 97  96 - 112 mEq/L   CO2 21  19 - 32 mEq/L   Glucose, Bld 221 (*) 70 - 99 mg/dL   BUN 24 (*) 6 - 23 mg/dL   Creatinine, Ser 2.53  0.50 - 1.35 mg/dL   Calcium 9.2  8.4 - 66.4 mg/dL   GFR calc non Af Amer 56 (*) >90 mL/min   GFR calc Af Amer 65 (*) >90 mL/min  GLUCOSE, CAPILLARY     Status: Abnormal   Collection Time   07/18/12 12:14 PM      Component Value Range   Glucose-Capillary 216 (*) 70 -  99 mg/dL    Signed: Dow Adolph 07/18/2012, 2:16 PM   Time Spent on Discharge: 45 minutes.

## 2012-07-17 NOTE — Progress Notes (Signed)
Physical Therapy Treatment Patient Details Name: Bruce Taylor MRN: [id number] DOB: 1941-05-10 Today's Date: 07/17/2012 Time: 4098-1191 PT Time Calculation (min): 13 min  PT Assessment / Plan / Recommendation Comments on Treatment Session  Pt adm with diarrhea and fall.  Pt continues to make slow progress.    Follow Up Recommendations  SNF     Does the patient have the potential to tolerate intense rehabilitation     Barriers to Discharge        Equipment Recommendations  None recommended by PT    Recommendations for Other Services    Frequency Min 2X/week   Plan Discharge plan remains appropriate;Frequency needs to be updated    Precautions / Restrictions Precautions Precautions: Fall Restrictions Weight Bearing Restrictions: No   Pertinent Vitals/Pain Slight lt foot pain with gait    Mobility  Bed Mobility Left Sidelying to Sit: 1: +2 Total assist Left Sidelying to Sit: Patient Percentage: 60% Sitting - Scoot to Edge of Bed: 4: Min assist Details for Bed Mobility Assistance: Assist to bring trunk up. Transfers Sit to Stand: 1: +2 Total assist;With upper extremity assist;From bed Sit to Stand: Patient Percentage: 70% Stand to Sit: 1: +2 Total assist;With upper extremity assist;With armrests;To chair/3-in-1 Stand to Sit: Patient Percentage: 90% Details for Transfer Assistance: assist to bring hips up Ambulation/Gait Ambulation/Gait Assistance: 4: Min assist (+2 for O2) Ambulation Distance (Feet): 70 Feet Assistive device: Rolling walker Ambulation/Gait Assistance Details: Dyspnea 3/4 with amb Gait Pattern: Step-through pattern;Decreased stride length;Wide base of support Gait velocity: decr    Exercises     PT Diagnosis:    PT Problem List:   PT Treatment Interventions:     PT Goals Acute Rehab PT Goals PT Goal: Supine/Side to Sit - Progress: Progressing toward goal PT Goal: Sit to Stand - Progress: Progressing toward goal PT Goal: Stand to Sit -  Progress: Progressing toward goal PT Goal: Ambulate - Progress: Progressing toward goal  Visit Information  Last PT Received On: 07/17/12 Assistance Needed: +2    Subjective Data  Subjective: "My foot feels better."   Cognition  Overall Cognitive Status: Appears within functional limits for tasks assessed/performed Arousal/Alertness: Awake/alert Orientation Level: Appears intact for tasks assessed Behavior During Session: [facility] for tasks performed    Balance  Static Standing Balance Static Standing - Balance Support: Bilateral upper extremity supported (on walker) Static Standing - Level of Assistance: 4: Min assist  End of Session PT - End of Session Activity Tolerance: Patient tolerated treatment well Patient left: in chair;with call bell/phone within reach;with family/visitor present Nurse Communication: Mobility status   GP     MAYCOCK,CARY 07/17/2012, 12:29 PM  [employer] PT [phone]

## 2012-07-18 DIAGNOSIS — E871 Hypo-osmolality and hyponatremia: Secondary | ICD-10-CM | POA: Diagnosis not present

## 2012-07-18 LAB — PROTIME-INR
INR: 2.48 — ABNORMAL HIGH (ref 0.00–1.49)
Prothrombin Time: 25.7 seconds — ABNORMAL HIGH (ref 11.6–15.2)

## 2012-07-18 LAB — BASIC METABOLIC PANEL
BUN: 24 mg/dL — ABNORMAL HIGH (ref 6–23)
CO2: 22 mEq/L (ref 19–32)
Calcium: 9.2 mg/dL (ref 8.4–10.5)
Chloride: 96 mEq/L (ref 96–112)
Creatinine, Ser: 1.23 mg/dL (ref 0.50–1.35)
GFR calc Af Amer: 65 mL/min — ABNORMAL LOW (ref >90)
GFR calc non Af Amer: 56 mL/min — ABNORMAL LOW (ref >90)
Glucose, Bld: 194 mg/dL — ABNORMAL HIGH (ref 70–99)
Glucose, Bld: 221 mg/dL — ABNORMAL HIGH (ref 70–99)
Sodium: 131 mEq/L — ABNORMAL LOW (ref 135–145)

## 2012-07-18 LAB — CBC
Hemoglobin: 12.6 g/dL — ABNORMAL LOW (ref 13.0–17.0)
MCH: 29.4 pg (ref 26.0–34.0)
Platelets: 209 10*3/uL (ref 150–400)
RBC: 4.29 MIL/uL (ref 4.22–5.81)
WBC: 8 10*3/uL (ref 4.0–10.5)

## 2012-07-18 MED ORDER — SODIUM CHLORIDE 0.9 % IV BOLUS (SEPSIS)
500.0000 mL | Freq: Once | INTRAVENOUS | Status: AC
  Administered 2012-07-18: 500 mL via INTRAVENOUS

## 2012-07-18 MED ORDER — CITALOPRAM HYDROBROMIDE 20 MG PO TABS: 20.0000 mg | ORAL_TABLET | Freq: Every day | ORAL | Status: DC

## 2012-07-18 NOTE — Progress Notes (Signed)
Subjective: He states that his diarrhea is now resolved and denies any abdominal pain. The left foot pain is better after starting prednisolone.  No new complaints and he is looking forward to going to SNF today. He has been medically cleared for the last several days, but skilled nursing facility placement has been delayed.  Objective: Vital signs in last 24 hours: Filed Vitals:   07/17/12 2153 07/18/12 0532 07/18/12 0923 07/18/12 1142  BP: 153/70 161/71    Pulse: 56 59    Temp: 98.3 F (36.8 C) 97.5 F (36.4 C)    TempSrc: Oral Oral    Resp: 20 18    Height:      Weight:  297 lb 2.9 oz (134.8 kg)    SpO2: 93% 92% 96% 94%   Weight change: -5 lb 11.7 oz (-2.6 kg)  Intake/Output Summary (Last 24 hours) at 07/18/12 1159 Last data filed at 07/17/12 1803  Gross per 24 hour  Intake    600 ml  Output    800 ml  Net   -200 ml   General: alert and was wearing CPAP, and cooperative to examination.   Lungs: normal respiratory effort, no accessory muscle use,  no crackles, and no wheezes.  Heart: normal rate, regular rhythm, no murmur, no gallop, and no rub.  Abdomen: soft, obese, non-tender, normal bowel sounds, no distention, no guarding, no rebound tenderness Pulses: 2+ DP/PT pulses bilaterally Extremities: Pressing the left foot moderately hard today does not hurt. No sings of inflammation at all.  Neurologic: nonfocal.   Lab Results: Basic Metabolic Panel:  Lab 07/18/12 1610 07/17/12 0530  NA 131* 132*  K 4.3 4.3  CL 96 98  CO2 22 25  GLUCOSE 194* 165*  BUN 24* 17  CREATININE 1.22 1.17  CALCIUM 9.3 9.4  MG -- --  PHOS -- --   Liver Function Tests:  Lab 07/13/12 1049  AST 19  ALT 11  ALKPHOS 57  BILITOT 0.7  PROT 7.8  ALBUMIN 3.6   CBC:  Lab 07/18/12 0544 07/17/12 0530 07/14/12 0540 07/13/12 1049  WBC 8.0 7.5 -- --  NEUTROABS -- -- 4.5 10.8*  HGB 12.6* 12.7* -- --  HCT 38.1* 38.2* -- --  MCV 88.8 89.3 -- --  PLT 209 177 -- --   Cardiac Enzymes:  Lab  07/13/12 2141  CKTOTAL --  CKMB --  CKMBINDEX --  TROPONINI <0.30   CBG:  Lab 07/18/12 0719 07/17/12 2145 07/17/12 1720 07/17/12 1147 07/17/12 0729 07/16/12 2221  GLUCAP 156* 243* 233* 164* 168* 183*   Coagulation:  Lab 07/18/12 0544 07/17/12 0530 07/15/12 0720 07/14/12 0540  LABPROT 25.7* 24.9* 25.5* 22.8*  INR 2.48* 2.38* 2.46* 2.11*    Urinalysis:  Lab 07/13/12 1227  COLORURINE YELLOW  LABSPEC 1.023  PHURINE 6.0  GLUCOSEU NEGATIVE  HGBUR SMALL*  BILIRUBINUR NEGATIVE  KETONESUR 15*  PROTEINUR 30*  UROBILINOGEN 1.0  NITRITE NEGATIVE  LEUKOCYTESUR NEGATIVE   Micro Results: Recent Results (from the past 240 hour(s))  CLOSTRIDIUM DIFFICILE BY PCR     Status: Normal   Collection Time   07/13/12  3:21 PM      Component Value Range Status Comment   C difficile by pcr NEGATIVE  NEGATIVE Final    Studies/Results: No results found. Medications: reviewed Scheduled Meds:    . albuterol  2.5 mg Nebulization BID  . antiseptic oral rinse  15 mL Mouth Rinse q12n4p  . aspirin EC  81 mg Oral Daily  .  atorvastatin  40 mg Oral q1800  . budesonide  0.5 mg Nebulization BID  . citalopram  40 mg Oral Daily  . insulin aspart protamine-insulin aspart  30 Units Subcutaneous BID WC  . isosorbide mononitrate  30 mg Oral Daily  . magnesium oxide  200 mg Oral Daily  . oxybutynin  5 mg Oral BID  . pantoprazole  40 mg Oral Daily  . predniSONE  40 mg Oral Q breakfast  . primidone  100 mg Oral QHS  . primidone  50 mg Oral Daily  . sodium chloride  3 mL Intravenous Q12H  . spironolactone  25 mg Oral Daily  . warfarin  10 mg Oral Custom  . warfarin  5 mg Oral Custom  . Warfarin - Physician Dosing Inpatient   Does not apply q1800   Continuous Infusions:  PRN Meds:.acetaminophen, acetaminophen, albuterol, nitroGLYCERIN, ondansetron Assessment/Plan: [name] is a 71 year old gentleman with multiple past medical history including congestive heart failure, COPD on home oxygen,  morbidly obese, and 9 previous cardiac stents, status post pacemaker placement secondary to complete heart block, atrial fibrillation with chronic Coumadin use, who presented to the ED, with diarrhea for one day now c/o left plantar pain.   Viral gastroenteritis: symptoms resolved. C diff negative.   Left plantar fasciitis: [name] complained of severe pain involving the bottom of his left foot. He was very concern about this pain since it had been recurrent but he had never received any treatment for it even though he stated that he mentioned it to multiple physicians in the past.  Physical exam was consistent with plantar fascitis. He responded to oral prednisone after just one dose. Given that his Cr was elevated at 1.27, we avoided NSAIDS. He was discharged on a tapering dose of  PO prednisone tapering dose to decrease inflammation.   -continue with Prednisone 40mg    Hyponatremia: He has been having ongoing hyponatremia with the most recent sodium level being 132. His BUN is also slightly elevated from 17 to 24 in the last 24 hours. This is most likely coming from dehydration and combined with a reduced oral intake. Plan -Bolus of 500 cc of normal saline. -Will check basic metabolic panel before he goes home/SNF today.  HYPERTENSION: His blood pressure remained stable throughout his hospitalization. He continued with his home lisinopril, and coreg. His furosemide was withheld. This can be restarted after the diarrhea has completely resolved.    COPD: His COPD remained stable. He continued with home oxygen therapy and with CPAP for OSA   Diabetes Type 2: A1c November 2012, was 6.8%. His home insulin regimen includes lispro protamine, insulin 75/25 64 units twice daily. During his hospitalization we started him on Novolog 70/30, 30 units BID and increased the dose of insulin since after starting the him on steroids to treat the plantar fasciitis. His CBGs remained stable overall. He was  discharged on his home dose.    Chronic a-fib: Patient has chronic atrial fibrillation, and on chronic Coumadin. INR remained therapeutic during his entire stay in the hospital.  Disposition: [name] required SNF due to decondition and foot pain. The wife and himself are agreeable to him having some rehab before he can fully return home.  Patient has decided to pursue SNF placement. CSW has been consulted  The patient does have a current PCP (Daisy Floro, MD), therefore is not requiring OPC follow-up after discharge.  The patient does not have transportation limitations that hinder transportation to clinic appointments.   Marland Kitchen  Services Needed at time of discharge: Y = Yes, Blank = No PT: Y  OT: N  RN: N  Equipment: N  Other:     LOS: 5 days   Dow Adolph 07/18/2012, 11:59 AM

## 2012-07-18 NOTE — Progress Notes (Signed)
Physical Therapy Treatment Patient Details Name: Bruce Taylor MRN: [id number] DOB: 06-06-1941 Today's Date: 07/18/2012 Time: 0017-4944 PT Time Calculation (min): 25 min  PT Assessment / Plan / Recommendation Comments on Treatment Session  Pt adm with diarrhea.  Pt continues to make steady progress.    Follow Up Recommendations  SNF     Does the patient have the potential to tolerate intense rehabilitation     Barriers to Discharge        Equipment Recommendations  None recommended by PT    Recommendations for Other Services    Frequency Min 2X/week   Plan Discharge plan remains appropriate;Frequency remains appropriate    Precautions / Restrictions Precautions Precautions: Fall Restrictions Weight Bearing Restrictions: No   Pertinent Vitals/Pain SaO2 94% on 3L with amb.    Mobility  Bed Mobility Left Sidelying to Sit: 4: Min assist;HOB elevated (HOB 70 degrees) Sitting - Scoot to Edge of Bed: 5: Supervision Transfers Sit to Stand: 4: Min assist;With upper extremity assist;From elevated surface (uses hands on walker to promote forward lean) Stand to Sit: 4: Min assist;With upper extremity assist;With armrests Details for Transfer Assistance: assist to bring hips up Ambulation/Gait Ambulation/Gait Assistance: 4: Min assist Ambulation Distance (Feet): 125 Feet Assistive device: Rolling walker Ambulation/Gait Assistance Details: Dyspnea 3/4 with amb. Gait Pattern: Step-through pattern;Decreased stride length;Wide base of support Gait velocity: decr    Exercises     PT Diagnosis:    PT Problem List:   PT Treatment Interventions:     PT Goals Acute Rehab PT Goals Pt will go Supine/Side to Sit: with supervision PT Goal: Supine/Side to Sit - Progress: Updated due to goal met Pt will go Sit to Stand: with supervision PT Goal: Sit to Stand - Progress: Updated due to goal met Pt will go Stand to Sit: with supervision PT Goal: Stand to Sit - Progress: Updated  due to goals met PT Goal: Ambulate - Progress: Progressing toward goal  Visit Information  Last PT Received On: 07/18/12 Assistance Needed: +1    Subjective Data  Subjective: Pt wishing me a merry Christmas.   Cognition  Overall Cognitive Status: Appears within functional limits for tasks assessed/performed Arousal/Alertness: Awake/alert Orientation Level: Appears intact for tasks assessed Behavior During Session: [facility] for tasks performed    Balance  Static Standing Balance Static Standing - Balance Support: Bilateral upper extremity supported (on walker) Static Standing - Level of Assistance: 4: Min assist  End of Session PT - End of Session Activity Tolerance: Patient tolerated treatment well Patient left: in chair;with call bell/phone within reach;with family/visitor present Nurse Communication: Mobility status   GP     [facility] 07/18/2012, 11:57 AM  [facility] PT [phone]

## 2012-07-18 NOTE — Care Management Note (Addendum)
    Page 1 of 1   07/18/2012     1:58:09 PM   CARE MANAGEMENT NOTE 07/18/2012  Patient:  Bruce Taylor, Bruce Taylor   Account Number:  [id number]  Date Initiated:  07/18/2012  Documentation initiated by:  Letha Cape  Subjective/Objective Assessment:   dx physical deconditioniing, gastroenteritis, fascitis  admit- lives with spouse.     Action/Plan:   pt eval recs snf   Anticipated DC Date:  07/18/2012   Anticipated DC Plan:  SKILLED NURSING FACILITY  In-house referral  Clinical Social Worker      DC Planning Services  CM consult      Choice offered to / List presented to:             Status of service:  Completed, signed off Medicare Important Message given?   (If response is "NO", the following Medicare IM given date fields will be blank) Date Medicare IM given:   Date Additional Medicare IM given:    Discharge Disposition:  SKILLED NURSING FACILITY  Per UR Regulation:  Reviewed for med. necessity/level of care/duration of stay  If discussed at Long Length of Stay Meetings, dates discussed:    Comments:  07/18/12 11:54 Debotah Ladona Ridgel RN, BSN [phone] patient lives with spouse, per physical therapy recs snf, CSW referral, patient for dc to [facility] today, CSW following.

## 2012-07-18 NOTE — Progress Notes (Signed)
Report given to Delfino Lovett Rn at [facility].

## 2012-07-18 NOTE — Clinical Social Work Note (Signed)
CSW was consulted to complete discharge of patient. Pt to transfer to [facility] today via [facility]. [facility] and family are aware of d/c. D/C packet complete with chart copy, signed FL2, and signed hard Rx.  CSW signing off as no other CSW needs identified at this time.  Lia Foyer, LCSWA [facility] Clinical Social Worker Contact #: [phone] (PRN)

## 2012-07-18 NOTE — Progress Notes (Signed)
Pt left via [facility] with all his belongings. Pt stable at time of discharge.

## 2012-10-13 ENCOUNTER — Ambulatory Visit: Payer: Self-pay | Admitting: Cardiovascular Disease

## 2012-10-13 DIAGNOSIS — Z7901 Long term (current) use of anticoagulants: Secondary | ICD-10-CM | POA: Insufficient documentation

## 2012-10-13 DIAGNOSIS — I482 Chronic atrial fibrillation, unspecified: Secondary | ICD-10-CM

## 2012-10-24 ENCOUNTER — Emergency Department (HOSPITAL_BASED_OUTPATIENT_CLINIC_OR_DEPARTMENT_OTHER): Payer: Medicare Other

## 2012-10-24 ENCOUNTER — Encounter (HOSPITAL_BASED_OUTPATIENT_CLINIC_OR_DEPARTMENT_OTHER): Payer: Self-pay | Admitting: *Deleted

## 2012-10-24 ENCOUNTER — Emergency Department (HOSPITAL_BASED_OUTPATIENT_CLINIC_OR_DEPARTMENT_OTHER)
Admission: EM | Admit: 2012-10-24 | Discharge: 2012-10-24 | Disposition: A | Discharge status: Home / Self Care | Payer: Medicare Other | Attending: Emergency Medicine | Admitting: Emergency Medicine

## 2012-10-24 DIAGNOSIS — Z8709 Personal history of other diseases of the respiratory system: Secondary | ICD-10-CM | POA: Insufficient documentation

## 2012-10-24 DIAGNOSIS — S301XXA Contusion of abdominal wall, initial encounter: Secondary | ICD-10-CM | POA: Insufficient documentation

## 2012-10-24 DIAGNOSIS — W010XXA Fall on same level from slipping, tripping and stumbling without subsequent striking against object, initial encounter: Secondary | ICD-10-CM | POA: Insufficient documentation

## 2012-10-24 DIAGNOSIS — R259 Unspecified abnormal involuntary movements: Secondary | ICD-10-CM | POA: Insufficient documentation

## 2012-10-24 DIAGNOSIS — Z9981 Dependence on supplemental oxygen: Secondary | ICD-10-CM | POA: Insufficient documentation

## 2012-10-24 DIAGNOSIS — Z5189 Encounter for other specified aftercare: Secondary | ICD-10-CM | POA: Insufficient documentation

## 2012-10-24 DIAGNOSIS — Z7982 Long term (current) use of aspirin: Secondary | ICD-10-CM | POA: Insufficient documentation

## 2012-10-24 DIAGNOSIS — Z794 Long term (current) use of insulin: Secondary | ICD-10-CM | POA: Insufficient documentation

## 2012-10-24 DIAGNOSIS — M129 Arthropathy, unspecified: Secondary | ICD-10-CM | POA: Insufficient documentation

## 2012-10-24 DIAGNOSIS — J449 Chronic obstructive pulmonary disease, unspecified: Secondary | ICD-10-CM | POA: Insufficient documentation

## 2012-10-24 DIAGNOSIS — Y939 Activity, unspecified: Secondary | ICD-10-CM | POA: Insufficient documentation

## 2012-10-24 DIAGNOSIS — G4733 Obstructive sleep apnea (adult) (pediatric): Secondary | ICD-10-CM | POA: Insufficient documentation

## 2012-10-24 DIAGNOSIS — G8929 Other chronic pain: Secondary | ICD-10-CM | POA: Insufficient documentation

## 2012-10-24 DIAGNOSIS — M545 Low back pain, unspecified: Secondary | ICD-10-CM | POA: Insufficient documentation

## 2012-10-24 DIAGNOSIS — E785 Hyperlipidemia, unspecified: Secondary | ICD-10-CM | POA: Insufficient documentation

## 2012-10-24 DIAGNOSIS — I252 Old myocardial infarction: Secondary | ICD-10-CM | POA: Insufficient documentation

## 2012-10-24 DIAGNOSIS — Y929 Unspecified place or not applicable: Secondary | ICD-10-CM | POA: Insufficient documentation

## 2012-10-24 DIAGNOSIS — I10 Essential (primary) hypertension: Secondary | ICD-10-CM | POA: Insufficient documentation

## 2012-10-24 DIAGNOSIS — Z79899 Other long term (current) drug therapy: Secondary | ICD-10-CM | POA: Insufficient documentation

## 2012-10-24 DIAGNOSIS — Z8679 Personal history of other diseases of the circulatory system: Secondary | ICD-10-CM | POA: Insufficient documentation

## 2012-10-24 DIAGNOSIS — Z87448 Personal history of other diseases of urinary system: Secondary | ICD-10-CM | POA: Insufficient documentation

## 2012-10-24 DIAGNOSIS — Z7901 Long term (current) use of anticoagulants: Secondary | ICD-10-CM | POA: Insufficient documentation

## 2012-10-24 DIAGNOSIS — I509 Heart failure, unspecified: Secondary | ICD-10-CM | POA: Insufficient documentation

## 2012-10-24 DIAGNOSIS — Z8719 Personal history of other diseases of the digestive system: Secondary | ICD-10-CM | POA: Insufficient documentation

## 2012-10-24 DIAGNOSIS — Z862 Personal history of diseases of the blood and blood-forming organs and certain disorders involving the immune mechanism: Secondary | ICD-10-CM | POA: Insufficient documentation

## 2012-10-24 DIAGNOSIS — J4489 Other specified chronic obstructive pulmonary disease: Secondary | ICD-10-CM | POA: Insufficient documentation

## 2012-10-24 DIAGNOSIS — Z950 Presence of cardiac pacemaker: Secondary | ICD-10-CM | POA: Insufficient documentation

## 2012-10-24 DIAGNOSIS — I2510 Atherosclerotic heart disease of native coronary artery without angina pectoris: Secondary | ICD-10-CM | POA: Insufficient documentation

## 2012-10-24 DIAGNOSIS — Z872 Personal history of diseases of the skin and subcutaneous tissue: Secondary | ICD-10-CM | POA: Insufficient documentation

## 2012-10-24 DIAGNOSIS — Z87891 Personal history of nicotine dependence: Secondary | ICD-10-CM | POA: Insufficient documentation

## 2012-10-24 DIAGNOSIS — S51809A Unspecified open wound of unspecified forearm, initial encounter: Secondary | ICD-10-CM | POA: Insufficient documentation

## 2012-10-24 DIAGNOSIS — I4891 Unspecified atrial fibrillation: Secondary | ICD-10-CM | POA: Insufficient documentation

## 2012-10-24 DIAGNOSIS — IMO0002 Reserved for concepts with insufficient information to code with codable children: Secondary | ICD-10-CM | POA: Insufficient documentation

## 2012-10-24 DIAGNOSIS — K219 Gastro-esophageal reflux disease without esophagitis: Secondary | ICD-10-CM | POA: Insufficient documentation

## 2012-10-24 DIAGNOSIS — Z9861 Coronary angioplasty status: Secondary | ICD-10-CM | POA: Insufficient documentation

## 2012-10-24 DIAGNOSIS — R892 Abnormal level of other drugs, medicaments and biological substances in specimens from other organs, systems and tissues: Secondary | ICD-10-CM | POA: Insufficient documentation

## 2012-10-24 LAB — URINALYSIS, ROUTINE W REFLEX MICROSCOPIC
Glucose, UA: NEGATIVE mg/dL
Ketones, ur: NEGATIVE mg/dL
Protein, ur: NEGATIVE mg/dL
Urobilinogen, UA: 0.2 mg/dL (ref 0.0–1.0)

## 2012-10-24 LAB — CBC WITH DIFFERENTIAL/PLATELET
Basophils Absolute: 0.1 10*3/uL (ref 0.0–0.1)
Eosinophils Absolute: 0.2 10*3/uL (ref 0.0–0.7)
Eosinophils Relative: 3 % (ref 0–5)
HCT: 38.6 % — ABNORMAL LOW (ref 39.0–52.0)
Lymphocytes Relative: 20 % (ref 12–46)
MCH: 29.7 pg (ref 26.0–34.0)
MCHC: 32.9 g/dL (ref 30.0–36.0)
MCV: 90.2 fL (ref 78.0–100.0)
Monocytes Absolute: 1 10*3/uL (ref 0.1–1.0)
RDW: 15.3 % (ref 11.5–15.5)
WBC: 7.5 10*3/uL (ref 4.0–10.5)

## 2012-10-24 LAB — URINE MICROSCOPIC-ADD ON

## 2012-10-24 LAB — BASIC METABOLIC PANEL
CO2: 27 mEq/L (ref 19–32)
Calcium: 9.2 mg/dL (ref 8.4–10.5)
Creatinine, Ser: 1.4 mg/dL — ABNORMAL HIGH (ref 0.50–1.35)
GFR calc non Af Amer: 48 mL/min — ABNORMAL LOW (ref >90)

## 2012-10-24 MED ORDER — IOHEXOL 300 MG/ML  SOLN
100.0000 mL | Freq: Once | INTRAMUSCULAR | Status: AC | PRN
  Administered 2012-10-24: 100 mL via INTRAVENOUS

## 2012-10-24 NOTE — ED Notes (Signed)
Blood work collected by [employer] RN sent to lab

## 2012-10-24 NOTE — ED Notes (Signed)
Laceration left forearm. He hit his arm against the night stand last night and sustained a skin tear. Pressure dsg intact on arrival. Takes Coumadin.

## 2012-10-24 NOTE — ED Notes (Signed)
MD at bedside. 

## 2012-10-24 NOTE — ED Notes (Signed)
Pt told MD he has a bruise on his left abdomen. Examined by Dr Ethelda Chick and CT ordered. Hemacult stool and UA sent.

## 2012-10-24 NOTE — ED Provider Notes (Addendum)
History     CSN: [id number]  Arrival date & time 10/24/12  1243   First MD Initiated Contact with Patient 10/24/12 1247      Chief Complaint  Patient presents with  . Laceration    (Consider location/radiation/quality/duration/timing/severity/associated sxs/prior treatment) HPI Patient tripped and fell yesterday sustaining a skin tear to his left forearm. Skin tear has continued to bleed. His INR was checked by his home health nurse this morning noted to be at 5.2( consistent with Coumadin toxicity). The skin tear on his left forearm was dressed by the nurse and it continued to ooze blood .she advised him to come to the emergency department for further treatment and evaluation. Patient reports that he had diarrhea starting 6 days ago which stopped yesterday. He denies lightheadedness denies weakness he feels improved today. No other injury. No other complaint. Past Medical History  Diagnosis Date  . Hyperlipidemia   . Hypertension   . Complete heart block     s/p PPM  . Atrial fibrillation     on chronic coumadin  . Ischemic cardiomyopathy     EF 45% by echo 2010  . COPD (chronic obstructive pulmonary disease)   . On home O2     "24/7"  . Renal insufficiency   . Tremor   . Angina pectoris   . Obesity, morbid 06/15/2011  . Coronary artery disease   . Complication of anesthesia     "sometimes I wake up during OR"  . Myocardial infarct ? 1995  . Blood transfusion ?2011    "hemorrhaging out my penis" (07/13/2012)  . Anemia   . Hematuria   . Ulcer     "don't know what kind; it was years ago"  . Stress     "wife's been dx'd w/stage IV bone cancer"  . Recurrent coronary arteriosclerosis following PTCA 06/17/2011  . Pacemaker 2010  . Peripheral vascular disease     "bad in lower legs" (07/13/2012)  . CHF (congestive heart failure)   . Shortness of breath     "all the time" (07/13/2012)  . Obstructive sleep apnea on CPAP   . Type II diabetes mellitus   . GERD  (gastroesophageal reflux disease)   . Stomach ulcer 1980's  . Arthritis     "lower back" (07/13/2012)  . Chronic lower back pain   . Hematuria ? 2011    Past Surgical History  Procedure Laterality Date  . Coronary stent placement      x 8  . Back surgery      x3  . Coronary angioplasty    . Coronary stent placement  10/2010    Has stents in LAD, RCA,LCX  . Coronary angioplasty with stent placement      "? total of 9 stents over the years; 6 at one time"  . Tonsillectomy      "I was a little boy" (07/13/2012)  . Appendectomy  1954  . Cataract extraction w/ intraocular lens  implant, bilateral  ~ 2011  . Insert / replace / remove pacemaker  07/2008  . Lumbar laminectomy  1990's    "had an extra lower lumbar; had it taken out" (07/13/2012)  . Lumbar disc surgery  1990's X    "cleaned up mess from 1st then 2nd OR" (07/13/2012)  . Hemorrhoid surgery      "in the dr's office; don't know if they cut them out or banded them"    Family History  Problem Relation Age of Onset  . Heart  disease Father   . Heart disease Brother   . Uterine cancer Mother   . Diabetes Sister     History  Substance Use Topics  . Smoking status: Former Smoker -- 2.00 packs/day for 52 years    Types: Cigarettes    Quit date: 07/26/2006  . Smokeless tobacco: Never Used  . Alcohol Use: Yes     Comment: 07/13/2012"did drink at one time for 5 years; stopped ~ 1980"      Review of Systems  Constitutional: Negative.   HENT: Negative.   Respiratory: Negative.   Cardiovascular: Negative.   Gastrointestinal: Negative.   Musculoskeletal: Negative.   Skin: Negative.   Neurological: Negative.        Chronic tremor at rest. Walks with cane and walker  Hematological: Bruises/bleeds easily.  Psychiatric/Behavioral: Negative.     Allergies  Penicillins and Chlorhexidine gluconate  Home Medications   Current Outpatient Rx  Name  Route  Sig  Dispense  Refill  . albuterol (PROAIR HFA) 108 (90 BASE)  MCG/ACT inhaler   Inhalation   Inhale 1-2 puffs into the lungs every 6 (six) hours as needed. For shortness of breath         . albuterol (PROVENTIL) (2.5 MG/3ML) 0.083% nebulizer solution   Nebulization   Take 2.5 mg by nebulization 2 (two) times daily.         Marland Kitchen aspirin EC 81 MG tablet   Oral   Take 81 mg by mouth daily.           . budesonide (PULMICORT) 0.5 MG/2ML nebulizer solution   Nebulization   Take 0.5 mg by nebulization 2 (two) times daily.         . carvedilol (COREG) 6.25 MG tablet   Oral   Take 6.25-9.375 mg by mouth 2 (two) times daily with a meal. Take 1 1/2 tabs each morning and 1 tab each evening         . citalopram (CELEXA) 20 MG tablet   Oral   Take 1 tablet (20 mg total) by mouth daily.   30 tablet   1   . insulin lispro protamine-insulin lispro (HUMALOG 75/25) (75-25) 100 UNIT/ML SUSP   Subcutaneous   Inject 64 Units into the skin 2 (two) times daily with a meal.         . isosorbide mononitrate (IMDUR) 30 MG 24 hr tablet   Oral   Take 30 mg by mouth daily.           . Magnesium 250 MG TABS   Oral   Take 250 mg by mouth daily.          . nitroGLYCERIN (NITROSTAT) 0.4 MG SL tablet   Sublingual   Place 0.4 mg under the tongue every 5 (five) minutes as needed. For chest pain         . oxybutynin (DITROPAN) 5 MG tablet   Oral   Take 5 mg by mouth 2 (two) times daily.           . pantoprazole (PROTONIX) 40 MG tablet   Oral   Take 40 mg by mouth daily.           . predniSONE (DELTASONE) 10 MG tablet   Oral   Take 4 tablets (40 mg total) by mouth daily with breakfast.   17 tablet   0     Take 40 mg once daily on Tuesday 07/18/2012 and We .Marland Kitchen.   . primidone (MYSOLINE) 50 MG  tablet   Oral   Take 50-100 mg by mouth 2 (two) times daily. 1 tab in the morning and 2 tabs at night          . simvastatin (ZOCOR) 80 MG tablet   Oral   Take 40 mg by mouth Daily. Take 1/2 tab at night         . warfarin (COUMADIN) 10 MG  tablet   Oral   Take 10 mg by mouth See admin instructions. Takes 10mg  (1 tab) on Tuesday, Wednesday, Thursday, Saturday, Sunday         . warfarin (COUMADIN) 5 MG tablet   Oral   Take 5 mg by mouth See admin instructions. Takes 5mg  (1 tab) on Monday and Friday           BP 142/62  Pulse 88  Temp(Src) 97.7 F (36.5 C) (Oral)  Resp 20  Wt 285 lb (129.275 kg)  BMI 39.2 kg/m2  SpO2 87%  Physical Exam  Nursing note and vitals reviewed. Constitutional: He appears well-developed and well-nourished.  HENT:  Head: Normocephalic and atraumatic.  Eyes: Conjunctivae are normal. Pupils are equal, round, and reactive to light.  Neck: Neck supple. No tracheal deviation present. No thyromegaly present.  Cardiovascular: Normal rate and regular rhythm.   No murmur heard. Pulmonary/Chest: Effort normal and breath sounds normal.  Abdominal: Soft. Bowel sounds are normal. He exhibits no distension. There is no tenderness.  Obese, grapefruit-sized purplish ecchymosis at left upper quadrant with corresponding tenderness  Genitourinary: Rectum normal. Guaiac negative stool.  Normal tone brown stool no gross blood  Musculoskeletal: Normal range of motion. He exhibits no edema and no tenderness.  Neurological: He is alert. Coordination normal.  Skin: Skin is warm and dry. No rash noted.  Left upper extremity with skin tear at all her aspect of forearm approximately 10 cm x 3 cm. No active bleeding  Psychiatric: He has a normal mood and affect.    ED Course  Procedures (including critical care time)  Labs Reviewed  CBC WITH DIFFERENTIAL - Abnormal; Notable for the following:    Hemoglobin 12.7 (*)    HCT 38.6 (*)    Monocytes Relative 13 (*)    All other components within normal limits  BASIC METABOLIC PANEL   No results found.   No diagnosis found. 3:10 PM patient is awake alert not lightheaded on standing.  Results for orders placed during the hospital encounter of 10/24/12  CBC  WITH DIFFERENTIAL      Result Value Range   WBC 7.5  4.0 - 10.5 K/uL   RBC 4.28  4.22 - 5.81 MIL/uL   Hemoglobin 12.7 (*) 13.0 - 17.0 g/dL   HCT 16.1 (*) 09.6 - 04.5 %   MCV 90.2  78.0 - 100.0 fL   MCH 29.7  26.0 - 34.0 pg   MCHC 32.9  30.0 - 36.0 g/dL   RDW 40.9  81.1 - 91.4 %   Platelets 204  150 - 400 K/uL   Neutrophils Relative 63  43 - 77 %   Neutro Abs 4.7  1.7 - 7.7 K/uL   Lymphocytes Relative 20  12 - 46 %   Lymphs Abs 1.5  0.7 - 4.0 K/uL   Monocytes Relative 13 (*) 3 - 12 %   Monocytes Absolute 1.0  0.1 - 1.0 K/uL   Eosinophils Relative 3  0 - 5 %   Eosinophils Absolute 0.2  0.0 - 0.7 K/uL   Basophils Relative  1  0 - 1 %   Basophils Absolute 0.1  0.0 - 0.1 K/uL  BASIC METABOLIC PANEL      Result Value Range   Sodium 137  135 - 145 mEq/L   Potassium 3.7  3.5 - 5.1 mEq/L   Chloride 100  96 - 112 mEq/L   CO2 27  19 - 32 mEq/L   Glucose, Bld 127 (*) 70 - 99 mg/dL   BUN 21  6 - 23 mg/dL   Creatinine, Ser 1.61 (*) 0.50 - 1.35 mg/dL   Calcium 9.2  8.4 - 09.6 mg/dL   GFR calc non Af Amer 48 (*) >90 mL/min   GFR calc Af Amer 56 (*) >90 mL/min  URINALYSIS, ROUTINE W REFLEX MICROSCOPIC      Result Value Range   Color, Urine YELLOW  YELLOW   APPearance CLEAR  CLEAR   Specific Gravity, Urine 1.012  1.005 - 1.030   pH 5.0  5.0 - 8.0   Glucose, UA NEGATIVE  NEGATIVE mg/dL   Hgb urine dipstick TRACE (*) NEGATIVE   Bilirubin Urine NEGATIVE  NEGATIVE   Ketones, ur NEGATIVE  NEGATIVE mg/dL   Protein, ur NEGATIVE  NEGATIVE mg/dL   Urobilinogen, UA 0.2  0.0 - 1.0 mg/dL   Nitrite NEGATIVE  NEGATIVE   Leukocytes, UA NEGATIVE  NEGATIVE  OCCULT BLOOD X 1 CARD TO LAB, STOOL      Result Value Range   Fecal Occult Bld NEGATIVE  NEGATIVE  URINE MICROSCOPIC-ADD ON      Result Value Range   WBC, UA 0-2  <3 WBC/hpf   RBC / HPF 3-6  <3 RBC/hpf   Bacteria, UA RARE  RARE   Urine-Other MUCOUS PRESENT     Ct Abdomen Pelvis W Contrast  10/24/2012  *RADIOLOGY REPORT*  Clinical Data: Fall  last night with left upper quadrant and bruising.  History of diabetes, congestive heart failure, stomach ulcer, reflux, coronary stent and appendectomy.  CT ABDOMEN AND PELVIS WITH CONTRAST  Technique:  Multidetector CT imaging of the abdomen and pelvis was performed following the standard protocol during bolus administration of intravenous contrast.  Contrast: OMNIPAQUE IOHEXOL 300 MG/ML  SOLN  Comparison: 05/19/2011.  Findings: Haziness of fat planes anterior abdominal subcutaneous region to left midline and left pelvic anterior subcutaneous region may reflect changes of hemorrhage from fall.  No evidence of underlying visceral or bowel injury.  No fracture detected.  Pacemaker in place.  Coronary artery calcifications.  Mild cardiomegaly.  Calcified aortic valve.  Atherosclerotic type changes of the abdominal aorta with ectasia measuring up to 2.7 cm.  Atherosclerotic type changes aortic branch vessels as well as iliac and femoral arteries.  No worrisome hepatic, splenic, adrenal, renal or pancreatic lesion. Left renal cyst measuring up to 3.5 cm.  Tiny calcified gallstone.  Small bladder diverticulum.  Calcified prostate gland.  Lumbar spine degenerative changes most notable L3-4.  IMPRESSION: Haziness of fat planes anterior abdominal subcutaneous region to left midline and left pelvic anterior subcutaneous region may reflect changes of hemorrhage from fall.  No evidence of underlying visceral or bowel injury.  No fracture detected.  Please see above for additional findings.   Original Report Authenticated By: Lacy Duverney, M.D.      MDM  Case discussed with HAGER. He is advised to stop Coumadin until further advised by Dr. Tresa Endo. He will have his INR rechecked 10/27/2012. Diagnosis #1 skin tear left forearm #2 Coumadin toxicity #3 blunt abdominal trauma with with  contusion to abdominal wall #4 fall        Doug Sou, MD 10/24/12 4540  Doug Sou, MD 10/24/12 [phone]

## 2012-11-14 ENCOUNTER — Other Ambulatory Visit (HOSPITAL_COMMUNITY): Payer: Self-pay | Admitting: Cardiovascular Disease

## 2012-11-14 DIAGNOSIS — I2510 Atherosclerotic heart disease of native coronary artery without angina pectoris: Secondary | ICD-10-CM

## 2012-11-27 ENCOUNTER — Ambulatory Visit (HOSPITAL_COMMUNITY)
Admission: RE | Admit: 2012-11-27 | Discharge: 2012-11-27 | Disposition: A | Discharge status: Home / Self Care | Payer: Medicare Other | Source: Ambulatory Visit | Attending: Cardiovascular Disease | Admitting: Cardiovascular Disease

## 2012-11-27 DIAGNOSIS — I2510 Atherosclerotic heart disease of native coronary artery without angina pectoris: Secondary | ICD-10-CM

## 2012-11-27 DIAGNOSIS — R42 Dizziness and giddiness: Secondary | ICD-10-CM | POA: Insufficient documentation

## 2012-11-27 DIAGNOSIS — E119 Type 2 diabetes mellitus without complications: Secondary | ICD-10-CM | POA: Insufficient documentation

## 2012-11-27 DIAGNOSIS — Z8249 Family history of ischemic heart disease and other diseases of the circulatory system: Secondary | ICD-10-CM | POA: Insufficient documentation

## 2012-11-27 DIAGNOSIS — R0609 Other forms of dyspnea: Secondary | ICD-10-CM | POA: Insufficient documentation

## 2012-11-27 DIAGNOSIS — I10 Essential (primary) hypertension: Secondary | ICD-10-CM | POA: Insufficient documentation

## 2012-11-27 DIAGNOSIS — F17200 Nicotine dependence, unspecified, uncomplicated: Secondary | ICD-10-CM | POA: Insufficient documentation

## 2012-11-27 DIAGNOSIS — E669 Obesity, unspecified: Secondary | ICD-10-CM | POA: Insufficient documentation

## 2012-11-27 DIAGNOSIS — R002 Palpitations: Secondary | ICD-10-CM | POA: Insufficient documentation

## 2012-11-27 DIAGNOSIS — R0989 Other specified symptoms and signs involving the circulatory and respiratory systems: Secondary | ICD-10-CM | POA: Insufficient documentation

## 2012-11-27 DIAGNOSIS — R079 Chest pain, unspecified: Secondary | ICD-10-CM | POA: Insufficient documentation

## 2012-11-27 MED ORDER — TECHNETIUM TC 99M SESTAMIBI GENERIC - CARDIOLITE
10.4000 | Freq: Once | INTRAVENOUS | Status: AC | PRN
  Administered 2012-11-27: 10 via INTRAVENOUS

## 2012-11-27 MED ORDER — TECHNETIUM TC 99M SESTAMIBI GENERIC - CARDIOLITE
30.1000 | Freq: Once | INTRAVENOUS | Status: AC | PRN
  Administered 2012-11-27: 30.1 via INTRAVENOUS

## 2012-11-27 MED ORDER — AMINOPHYLLINE 25 MG/ML IV SOLN
125.0000 mg | Freq: Once | INTRAVENOUS | Status: AC
  Administered 2012-11-27: 125 mg via INTRAVENOUS

## 2012-11-27 MED ORDER — REGADENOSON 0.4 MG/5ML IV SOLN
0.4000 mg | Freq: Once | INTRAVENOUS | Status: AC
  Administered 2012-11-27: 0.4 mg via INTRAVENOUS

## 2012-11-27 NOTE — Procedures (Addendum)
[facility] [address] [place] [address] [place] [address] [phone]  Cardiology Nuclear Med Study  Bruce Taylor is a 72 y.o. male     MRN : [id number]     DOB: 1940-09-27  Procedure Date: 11/27/2012  Nuclear Med Background Indication for Stress Test:  Evaluation for Ischemia and Stent Patency History:  COPD and PACER;CAD;AF;STENT/PTCA--MULTIPLE Cardiac Risk Factors: Family History - CAD, Hypertension, IDDM Type 2, Lipids, Obesity and Smoker  Symptoms:  Chest Pain, DOE, Fatigue, Light-Headedness, Palpitations and SOB   Nuclear Pre-Procedure Caffeine/Decaff Intake:  10:00pm NPO After: 8:00am   IV Site: R Hand  IV 0.9% NS with Angio Cath:  22g  Chest Size (in):  50"  IV Started by: Emmit Pomfret, RN  Height: 6' (1.829 m)  Cup Size: n/a  BMI:  Body mass index is 37.7 kg/(m^2). Weight:  278 lb (126.1 kg)   Tech Comments:  N/A    Nuclear Med Study 1 or 2 day study: 1 day  Stress Test Type:  Lexiscan  Order Authorizing Provider:  Nicki Guadalajara, MD   Resting Radionuclide: Technetium 50m Sestamibi  Resting Radionuclide Dose: 10.4 mCi   Stress Radionuclide:  Technetium 57m Sestamibi  Stress Radionuclide Dose: 30.1 mCi           Stress Protocol Rest HR: 56 Stress HR: 58  Rest BP: 143/70 Stress BP: 146/72  Exercise Time (min): n/a METS: n/a   Predicted Max HR: 147 bpm % Max HR: 39.46 bpm Rate Pressure Product: 8990  Dose of Adenosine (mg):  n/a Dose of Lexiscan: 0.4 mg  Dose of Atropine (mg): n/a Dose of Dobutamine: n/a mcg/kg/min (at max HR)  Stress Test Technologist: Esperanza Sheets, CCT Nuclear Technologist: Gonzella Lex, CNMT   Rest Procedure:  Myocardial perfusion imaging was performed at rest 45 minutes following the intravenous administration of Technetium 51m Sestamibi. Stress Procedure:  The patient received IV Lexiscan 0.4 mg over 15-seconds.  Technetium 26m Sestamibi injected at 30-seconds.  The patient  experienced increased SOB, 75 mg of IV Aminophylline was administered.  There were no significant changes with Lexiscan.  Quantitative spect images were obtained after a 45 minute delay.  Transient Ischemic Dilatation (Normal <1.22):  1.02 Lung/Heart Ratio (Normal <0.45):  0.27 QGS EDV:  116 ml QGS ESV:  46 ml LV Ejection Fraction: 60%  Rest ECG: Atrial paced rhythm  Stress ECG: Atrial paced rhythm, no lexiscan changes  QPS Raw Data Images:  Normal; no motion artifact; normal heart/lung ratio. Stress Images:  Small fixed apical bowel artifact Rest Images:  Small fixed apical bowel artifact. Subtraction (SDS):  No evidence of ischemia.  Impression Exercise Capacity:  Lexiscan with no exercise. BP Response:  Normal blood pressure response. Clinical Symptoms:  Mild dyspnea ECG Impression:  No significant ECG changes with Lexiscan. Comparison with Prior Nuclear Study: No significant change from previous study  Overall Impression:  Low risk stress nuclear study with small fixed apical bowel artifact. There is probably pacer induced septal dyskinesis. There is prominent RV uptake, which may suggest high pulmonary pressure..  LV Wall Motion:  Normal LV function, pacer induced septal dyskinesis; EF 60%  Chrystie Nose, MD, [facility] Board Certified in Nuclear Cardiology Attending Cardiologist [facility]  Chrystie Nose, MD  11/27/2012 2:59 PM

## 2012-12-08 ENCOUNTER — Encounter: Payer: Self-pay | Admitting: Pulmonary Disease

## 2012-12-08 ENCOUNTER — Ambulatory Visit (INDEPENDENT_AMBULATORY_CARE_PROVIDER_SITE_OTHER): Payer: Medicare Other | Admitting: Pulmonary Disease

## 2012-12-08 VITALS — BP 126/78 | HR 76 | Temp 98.2°F | Ht 68.5 in | Wt 283.8 lb

## 2012-12-08 DIAGNOSIS — J449 Chronic obstructive pulmonary disease, unspecified: Secondary | ICD-10-CM

## 2012-12-08 DIAGNOSIS — G4733 Obstructive sleep apnea (adult) (pediatric): Secondary | ICD-10-CM

## 2012-12-08 DIAGNOSIS — J9610 Chronic respiratory failure, unspecified whether with hypoxia or hypercapnia: Secondary | ICD-10-CM

## 2012-12-08 NOTE — Patient Instructions (Addendum)
Continue on your current inhaled medications and oxygen Keep working on weight loss.  This is the key to you getting better. Continue on cpap, and we will send an order to [facility] to work with you on your mask. followup with me in 6mos

## 2012-12-08 NOTE — Assessment & Plan Note (Signed)
The patient appears to be stable from a COPD standpoint.  He has adequate air flow on exam, and no bronchospasm.  I've asked him to continue on his current medications.  He does have a sore left chest from falls, but there is nothing by exam to suggest a pneumothorax or pleural effusion.  He will need a chest x-ray if his symptoms worsen.

## 2012-12-08 NOTE — Progress Notes (Signed)
  Subjective:    Patient ID: Bruce Taylor, male    DOB: 1941-04-01, 72 y.o.   MRN: [id number]  HPI The patient comes in today for followup of his mild COPD, as well as obstructive sleep apnea.  He apparently was in the hospital last December with a flulike illness, and was discharged to a rehabilitation unit for a few months.  He has since gone home and is getting home physical therapy, but is very debilitated and weak.  This has led to increasing shortness of breath, and he has also had a few falls where he has hurt his back and left side.  He denies any chest congestion, or cough with significant mucus.  He is staying on his nebulizer treatments and also his oxygen.  He is also using his CPAP compliantly, and is due for a new mask.   Review of Systems  Constitutional: Negative for fever and unexpected weight change.  HENT: Positive for congestion, rhinorrhea and sneezing. Negative for ear pain, nosebleeds, sore throat, trouble swallowing, dental problem, postnasal drip and sinus pressure.   Eyes: Negative for redness and itching.  Respiratory: Positive for shortness of breath and wheezing. Negative for cough and chest tightness.   Cardiovascular: Positive for chest pain. Negative for palpitations and leg swelling.  Gastrointestinal: Negative for nausea and vomiting.  Genitourinary: Negative for dysuria.  Musculoskeletal: Negative for joint swelling.  Skin: Negative for rash.  Neurological: Positive for headaches.  Hematological: Does not bruise/bleed easily.  Psychiatric/Behavioral: Positive for dysphoric mood. The patient is nervous/anxious.        Objective:   Physical Exam Obese male in no acute distress Nose without purulent discharge noted Oropharynx clear Neck without lymphadenopathy or thyromegaly Chest with a few basilar crackles, otherwise adequate air flow with no wheezing Cardiac exam with a slightly irregular rhythm, controlled ventricular response. Lower  extremities with 1+ pedal edema, no cyanosis Alert and oriented, moves all 4 extremities.       Assessment & Plan:

## 2012-12-08 NOTE — Assessment & Plan Note (Signed)
The patient is wearing CPAP compliantly, and is having no issues with the pressure.  He is asking about a new mask, and we'll therefore send an order to his medical equipment company.  I have congratulated him on his recent weight loss, and stressed the importance of continuing.

## 2012-12-14 ENCOUNTER — Ambulatory Visit (INDEPENDENT_AMBULATORY_CARE_PROVIDER_SITE_OTHER): Payer: Medicare Other | Admitting: Cardiovascular Disease

## 2012-12-14 ENCOUNTER — Encounter: Payer: Self-pay | Admitting: Cardiovascular Disease

## 2012-12-14 ENCOUNTER — Ambulatory Visit (INDEPENDENT_AMBULATORY_CARE_PROVIDER_SITE_OTHER): Payer: Medicare Other | Admitting: Pharmacist Clinician (PhC)/ Clinical Pharmacy Specialist

## 2012-12-14 ENCOUNTER — Other Ambulatory Visit: Payer: Self-pay | Admitting: *Deleted

## 2012-12-14 VITALS — BP 132/62 | HR 58 | Ht 71.5 in | Wt 284.0 lb

## 2012-12-14 DIAGNOSIS — I482 Chronic atrial fibrillation, unspecified: Secondary | ICD-10-CM

## 2012-12-14 DIAGNOSIS — I4891 Unspecified atrial fibrillation: Secondary | ICD-10-CM

## 2012-12-14 DIAGNOSIS — Z950 Presence of cardiac pacemaker: Secondary | ICD-10-CM

## 2012-12-14 DIAGNOSIS — I10 Essential (primary) hypertension: Secondary | ICD-10-CM

## 2012-12-14 DIAGNOSIS — I2510 Atherosclerotic heart disease of native coronary artery without angina pectoris: Secondary | ICD-10-CM

## 2012-12-14 DIAGNOSIS — Z7901 Long term (current) use of anticoagulants: Secondary | ICD-10-CM

## 2012-12-14 LAB — POCT INR: INR: 4.6

## 2012-12-14 MED ORDER — METOPROLOL TARTRATE 25 MG PO TABS: 25.0000 mg | ORAL_TABLET | Freq: Two times a day (BID) | ORAL | Status: DC

## 2012-12-14 NOTE — Patient Instructions (Signed)
Your physician has recommended you make the following change in your medication: stop your carvedilol.  You will be getting a new prescription for lopressor 25mg  to take twice daily.   Your physician recommends that you schedule a follow-up appointment in: 6 months.

## 2012-12-27 ENCOUNTER — Telehealth: Payer: Self-pay | Admitting: Cardiovascular Disease

## 2012-12-27 MED ORDER — SPIRONOLACTONE 25 MG PO TABS: ORAL_TABLET | ORAL | Status: DC

## 2012-12-27 NOTE — Telephone Encounter (Signed)
Bruce Taylor is needing prescription refill on his Furosemide 40mg , Carvedilol 6.25mg , Warfain 10mg  , Spironolact 25mg , and Simvastain 80mg  needs a 90 day supply...to  [facility] on [address] .Marland Kitchen   Thanks

## 2012-12-27 NOTE — Telephone Encounter (Signed)
Refilled patients spironalactone 25 mg.

## 2012-12-28 ENCOUNTER — Ambulatory Visit: Payer: Medicare Other | Admitting: Pharmacist Clinician (PhC)/ Clinical Pharmacy Specialist

## 2012-12-31 ENCOUNTER — Encounter: Payer: Self-pay | Admitting: Cardiovascular Disease

## 2012-12-31 NOTE — Progress Notes (Signed)
Patient ID: Bruce Taylor, male   DOB: 09/11/1940, 72 y.o.   MRN: [id number]     HPI: Bruce Taylor, is a 72 y.o. male who presents to the office today for cardiology reevaluation. I last saw him in October 2013. He is a former patient of Dr. Caprice Kluver and has established coronary artery disease. He has undergone multiple percutaneous coronary interventions and has stents placed in his LAD, diagonal, circumflex marginal vessels, as well as proximal to mid right coronary artery. In November 2012 I performed successful cutting balloon arthrotomy for diffuse in-stent restenosis in the stents to his right coronary artery. Additional problems include permanent atrial fibrillation, he is status post permanent pacemaker insertion for significant bradycardia. He has been on Coumadin anticoagulation therapy. He has a history of significant tremor right greater than left and this is not felt to be due to Parkinson's disease for which he has been evaluated by Dr. Anne Hahn. Recently, he did have a pacemaker interrogation in April 2014 and at that time when reviewed he did not have any episodes of ventricular arrhythmia. He is set in VVIR mode. Reference to chest that had been evaluated in the emergency room prior to that interrogation with a skin tear in his left after tripping and falling. He is unaware of any tachypalpitations. He underwent a nuclear perfusion study on 11/27/2012. This was a low-risk nuclear study small apical bowel it artifact. Probable pacer-induced septal dyskinesis. Ejection fraction was 60%. He did not have significant ischemia.  His complaint today is that of some continued shortness of breath. He continues to experience his tremor. He denies recent chest pain.  Past Medical History  Diagnosis Date  . Hyperlipidemia   . Hypertension   . Complete heart block     s/p PPM  . Atrial fibrillation 05/09/2012    on chronic coumadin; Lexiscan - normal myocardial perfusion scan with a  post-stress EF of 58%, pharmalogical stress without chest pain or EKG changes for ischemia  . Ischemic cardiomyopathy     EF 45% by echo 2010  . COPD (chronic obstructive pulmonary disease) 02/04/2010    Dobutamine Myocardial Perfusion - normal myocardial perfusion study, EKG negative for ischemia  . On home O2     "24/7"  . Renal insufficiency   . Tremor   . Angina pectoris   . Obesity, morbid 06/15/2011  . Coronary artery disease 05/09/2012    2D Echo - EF >50%, left atrium severely dilated, moderate septal hypokinesis  . Complication of anesthesia     "sometimes I wake up during OR"  . Myocardial infarct ? 1995  . Blood transfusion ?2011    "hemorrhaging out my penis" (07/13/2012)  . Anemia   . Hematuria   . Ulcer     "don't know what kind; it was years ago"  . Stress     "wife's been dx'd w/stage IV bone cancer"  . Recurrent coronary arteriosclerosis following PTCA 06/17/2011  . Pacemaker 2010  . Peripheral vascular disease     "bad in lower legs" (07/13/2012)  . CHF (congestive heart failure) 04/29/2009    2D Echo - EF 45-50%, normal  . Shortness of breath     "all the time" (07/13/2012)  . Obstructive sleep apnea on CPAP   . Type II diabetes mellitus   . GERD (gastroesophageal reflux disease)   . Stomach ulcer 1980's  . Arthritis     "lower back" (07/13/2012)  . Chronic lower back pain   . Hematuria ?  2011  . PAD (peripheral artery disease) 05/09/2012    Lower Arterial Exam - Right and left distal external iliac arteries-equal to or less than 50% diameter reductions, Bilateral SFA-suggestive 50-69% diameter reduction,right posterior tibial artery and left peroneal artery both occluded  . Bruit 07/23/2010    Carotid Duplex - Right and left ICAs demonstrate small amount of fibrous plaque not hemodynamically significant  . Shortness of breath on exertion 12/12/2003    Nuclear stress test - very low risk study and ischemia, if present, is minimal and doesn't reach  statistical significance    Past Surgical History  Procedure Laterality Date  . Coronary stent placement      x 8  . Back surgery      x3  . Coronary angioplasty    . Coronary stent placement  10/2010    Has stents in LAD, RCA,LCX  . Coronary angioplasty with stent placement      "? total of 9 stents over the years; 6 at one time"  . Tonsillectomy      "I was a little boy" (07/13/2012)  . Appendectomy  1954  . Cataract extraction w/ intraocular lens  implant, bilateral  ~ 2011  . Insert / replace / remove pacemaker  07/2008  . Lumbar laminectomy  1990's    "had an extra lower lumbar; had it taken out" (07/13/2012)  . Lumbar disc surgery  1990's X    "cleaned up mess from 1st then 2nd OR" (07/13/2012)  . Hemorrhoid surgery      "in the dr's office; don't know if they cut them out or banded them"  . Transesophageal echocardiogram  11/13/2004    EF 45-50%, no evidence of intracardiac mass or thrombus, does appear to be a small AST with mild left-to-right and right-to-left shunting  . Cardiac catheterization  06/16/2011    RCA 90% stenosis 3.5x56mm noncompliant Trek balloon was used for high-pressure noncompliant dilation up to approximately 3.51 proximally and 3.4 distally entire segment was reduced to less than 5%  . Cardiac catheterization  10/27/2010    95% stenosis of the distal RCA, 3x53mm bare-metal non-DES Medtronic Integrity stent to overlap stenosis, post-dilated with a 3.5x21 [place] Sprinter balloon to 3.57mm. As balloon was pulled back, there was progressive increase in dilation to 3.76mm  . Cardiac catheterization  10/26/2010    RCA - discrete 95% proximal in-stent restenosis, mid vessel 80%discrete stenosis;   . Cardiac catheterization  07/30/2008    2x60mm angioscope balloon cuts 3 to 5 atmospheric pressure    Allergies  Allergen Reactions  . Penicillins Anaphylaxis and Swelling    "swell up all over my body; throat mainly, I think" (07/13/2012)  . Chlorhexidine Gluconate  Itching    Chg cloth "don't remember how bad it was" (07/13/2012)    Current Outpatient Prescriptions  Medication Sig Dispense Refill  . albuterol (PROAIR HFA) 108 (90 BASE) MCG/ACT inhaler Inhale 1-2 puffs into the lungs every 6 (six) hours as needed. For shortness of breath      . albuterol (PROVENTIL) (2.5 MG/3ML) 0.083% nebulizer solution Take 2.5 mg by nebulization 2 (two) times daily.      Marland Kitchen aspirin EC 81 MG tablet Take 81 mg by mouth daily.        . budesonide (PULMICORT) 0.5 MG/2ML nebulizer solution Take 0.5 mg by nebulization 2 (two) times daily.      . citalopram (CELEXA) 20 MG tablet Take 1 tablet (20 mg total) by mouth daily.  30 tablet  1  . diphenoxylate-atropine (LOMOTIL) 2.5-0.025 MG per tablet Take 1 tablet by mouth 4 (four) times daily as needed for diarrhea or loose stools.      . insulin NPH-regular (RELION 70/30) (70-30) 100 UNIT/ML injection Inject 42-46 Units into the skin 2 (two) times daily with a meal.      . isosorbide mononitrate (IMDUR) 30 MG 24 hr tablet Take 30 mg by mouth daily.        . Magnesium 250 MG TABS Take 250 mg by mouth daily.       . metoprolol tartrate (LOPRESSOR) 25 MG tablet Take 1 tablet (25 mg total) by mouth 2 (two) times daily.  180 tablet  3  . nitroGLYCERIN (NITROSTAT) 0.4 MG SL tablet Place 0.4 mg under the tongue every 5 (five) minutes as needed. For chest pain      . pantoprazole (PROTONIX) 40 MG tablet Take 40 mg by mouth daily.        . primidone (MYSOLINE) 50 MG tablet Take 50-100 mg by mouth 2 (two) times daily. 1 tab in the morning and 2 tabs at night       . simvastatin (ZOCOR) 80 MG tablet Take 40 mg by mouth Daily. Take 1/2 tab at night      . spironolactone (ALDACTONE) 25 MG tablet Take 1/2 tablet po twice daily  90 tablet  3  . warfarin (COUMADIN) 10 MG tablet Take 10 mg by mouth See admin instructions. Takes 10mg  (1 tab) on Tuesday, Wednesday, Thursday, Saturday, Sunday      . warfarin (COUMADIN) 5 MG tablet Take 5 mg by mouth  See admin instructions. Takes 5mg  (1 tab) on Monday and Friday       No current facility-administered medications for this visit.    Socially he is married has 2 children and 3 grandchildren. Rare alcohol. There is no tobacco use. He is unable to exercise.  ROS he denies fevers chills or night sweats. He has discontinued tremor not felt to be parkinsonian in etiology. He does note some mild shortness of breath. He does note some mild leg edema. He denies bleeding. He does have obesity. He denies changes in GU symptoms. He denies constipation.   PE BP 132/62  Pulse 58  Ht 5' 11.5" (1.816 m)  Wt 284 lb (128.822 kg)  BMI 39.06 kg/m2  General: Alert, oriented, no distress.  HEENT: Normocephalic, atraumatic. Pupils round and reactive; sclera anicteric;no lid lag,  Nose without nasal septal hypertrophy Mouth/Parynx benign; Mallinpatti scale 3 Neck: No JVD, no carotid briuts Lungs: clear to ausculatation and percussion; no wheezing or rales Heart: Atrial fibrillation  underlying his paced rhythm s1 s2 normal 1-2/6 systolic murmur, unchanged. Abdomen: Moderate central adiposity, soft, nontender; no hepatosplenomehaly, BS+; abdominal aorta nontender and not dilated by palpation. Pulses 2+ Extremities: no clubbing cyanosis or edema, Homan's sign negative  Neurologic: grossly nonfocal  ECG: Atrial fibrillation with ventricular pacing, rate 59  LABS:  BMET    Component Value Date/Time   NA 137 10/24/2012 1310   K 3.7 10/24/2012 1310   CL 100 10/24/2012 1310   CO2 27 10/24/2012 1310   GLUCOSE 127* 10/24/2012 1310   BUN 21 10/24/2012 1310   CREATININE 1.40* 10/24/2012 1310   CALCIUM 9.2 10/24/2012 1310   GFRNONAA 48* 10/24/2012 1310   GFRAA 56* 10/24/2012 1310     Hepatic Function Panel     Component Value Date/Time   PROT 7.8 07/13/2012 1049   ALBUMIN 3.6 07/13/2012 1049  AST 19 07/13/2012 1049   ALT 11 07/13/2012 1049   ALKPHOS 57 07/13/2012 1049   BILITOT 0.7 07/13/2012 1049   BILIDIR 0.1  04/28/2009 1830   IBILI 0.6 04/28/2009 1830     CBC    Component Value Date/Time   WBC 7.5 10/24/2012 1310   RBC 4.28 10/24/2012 1310   HGB 12.7* 10/24/2012 1310   HCT 38.6* 10/24/2012 1310   PLT 204 10/24/2012 1310   MCV 90.2 10/24/2012 1310   MCH 29.7 10/24/2012 1310   MCHC 32.9 10/24/2012 1310   RDW 15.3 10/24/2012 1310   LYMPHSABS 1.5 10/24/2012 1310   MONOABS 1.0 10/24/2012 1310   EOSABS 0.2 10/24/2012 1310   BASOSABS 0.1 10/24/2012 1310     BNP    Component Value Date/Time   PROBNP 477.5* 06/15/2011 1233    Lipid Panel     Component Value Date/Time   CHOL 131 06/16/2011 0720   TRIG 144 06/16/2011 0720   HDL 26* 06/16/2011 0720   CHOLHDL 5.0 06/16/2011 0720   VLDL 29 06/16/2011 0720   LDLCALC 76 06/16/2011 0720     RADIOLOGY: No results found.    ASSESSMENT AND PLAN: [name] is any recent chest pain. His most recent nuclear perfusion study continues to show fairly normal perfusion he most likely has pacemaker-induced septal dyskinesis. Ejection fraction is 60%. His pacemaker is functioning appropriately. Interrogation in April did not show any ventricular arrhythmias. Presently, he still has shortness of breath. I will change his carvedilol to a more cardioselective metoprolol tartrate 25 mg twice a day. His INR was elevated and adjustments have been made at him in the therapeutic range you will be taking his isosorbide 60 mg daily. I will see him in 6 months for followup evaluation.     Lennette Bihari, MD, [facility]  12/31/2012 11:52 AM

## 2013-01-01 ENCOUNTER — Ambulatory Visit (INDEPENDENT_AMBULATORY_CARE_PROVIDER_SITE_OTHER): Payer: Medicare Other | Admitting: Pharmacist Clinician (PhC)/ Clinical Pharmacy Specialist

## 2013-01-01 VITALS — BP 116/68 | HR 72

## 2013-01-01 DIAGNOSIS — Z7901 Long term (current) use of anticoagulants: Secondary | ICD-10-CM

## 2013-01-01 DIAGNOSIS — I482 Chronic atrial fibrillation, unspecified: Secondary | ICD-10-CM

## 2013-01-01 DIAGNOSIS — I4891 Unspecified atrial fibrillation: Secondary | ICD-10-CM

## 2013-01-01 LAB — POCT INR: INR: 4

## 2013-01-01 MED ORDER — SIMVASTATIN 80 MG PO TABS: ORAL_TABLET | ORAL | Status: DC

## 2013-01-02 ENCOUNTER — Other Ambulatory Visit: Payer: Self-pay

## 2013-01-05 ENCOUNTER — Telehealth: Payer: Self-pay | Admitting: *Deleted

## 2013-01-05 NOTE — Telephone Encounter (Signed)
Faxed signed PT/INR VO back.

## 2013-01-15 ENCOUNTER — Ambulatory Visit: Payer: Medicare Other | Admitting: Pharmacist Clinician (PhC)/ Clinical Pharmacy Specialist

## 2013-01-17 ENCOUNTER — Telehealth: Payer: Self-pay | Admitting: Cardiovascular Disease

## 2013-01-17 NOTE — Telephone Encounter (Signed)
Having chest pain on right side-pain its making him jerk-Should he take a nitroglycerin or should he be seen?

## 2013-01-17 NOTE — Telephone Encounter (Signed)
Returned call and spoke w/ [name], pt's wife.  Stated pt took a NTG and about 5 mins afterwards he said he felt fine.  Stated pt denied pain right now.  Stated pt has jerks and the jerk made him more aware of the pain in his chest.  Pt talking in background and stated he feels fine now.  Wife stated pt will go to ER if CP continues.  Advised ER if CP continues after 3 NTG and to call office for any changes.  Agreed.

## 2013-02-05 ENCOUNTER — Ambulatory Visit (INDEPENDENT_AMBULATORY_CARE_PROVIDER_SITE_OTHER): Payer: Medicare Other | Admitting: Cardiovascular Disease

## 2013-02-05 ENCOUNTER — Other Ambulatory Visit: Payer: Self-pay | Admitting: Cardiovascular Disease

## 2013-02-05 ENCOUNTER — Ambulatory Visit (INDEPENDENT_AMBULATORY_CARE_PROVIDER_SITE_OTHER): Payer: Medicare Other | Admitting: Pharmacist Clinician (PhC)/ Clinical Pharmacy Specialist

## 2013-02-05 ENCOUNTER — Encounter: Payer: Self-pay | Admitting: Cardiovascular Disease

## 2013-02-05 VITALS — BP 134/70 | HR 58 | Resp 16 | Ht 71.5 in | Wt 274.0 lb

## 2013-02-05 DIAGNOSIS — I482 Chronic atrial fibrillation, unspecified: Secondary | ICD-10-CM

## 2013-02-05 DIAGNOSIS — I4891 Unspecified atrial fibrillation: Secondary | ICD-10-CM

## 2013-02-05 DIAGNOSIS — G250 Essential tremor: Secondary | ICD-10-CM | POA: Insufficient documentation

## 2013-02-05 DIAGNOSIS — G252 Other specified forms of tremor: Secondary | ICD-10-CM

## 2013-02-05 DIAGNOSIS — I2510 Atherosclerotic heart disease of native coronary artery without angina pectoris: Secondary | ICD-10-CM

## 2013-02-05 DIAGNOSIS — Z7901 Long term (current) use of anticoagulants: Secondary | ICD-10-CM

## 2013-02-05 DIAGNOSIS — Z950 Presence of cardiac pacemaker: Secondary | ICD-10-CM

## 2013-02-05 LAB — PACEMAKER DEVICE OBSERVATION
BRDY-0002RV: 55 {beats}/min
BRDY-0004RV: 100 {beats}/min
RV LEAD AMPLITUDE: 15.68 mv
RV LEAD THRESHOLD: 0.5 V

## 2013-02-05 LAB — POCT INR: INR: 2.6

## 2013-02-05 MED ORDER — PRIMIDONE 50 MG PO TABS: 50.0000 mg | ORAL_TABLET | Freq: Two times a day (BID) | ORAL | Status: AC

## 2013-02-05 NOTE — Progress Notes (Signed)
In office pacemaker interrogation. Normal device function. No changes made this session. 

## 2013-02-05 NOTE — Assessment & Plan Note (Signed)
He has been out of his prescription for primidone since she has not been able to see Dr. Willis recently. I gave him a couple of months of this medication but he needs to make a followup appointment with his neurologist. 

## 2013-02-05 NOTE — Progress Notes (Signed)
Patient ID: Bruce Taylor, male   DOB: [birth year], 72 y.o.   MRN: [id number]     Reason for office visit Atrial fibrillation, pacemaker, CAD  Has been a year since his previous appointment and has not had any new cardiovascular events during that time. He has had a lot of problem with progression of his neurological and urological problems. His hand tremor has progressed to the point that he cannot use a spoon or a glass without spilling everything. Things have been even worse over the last 3 months since he has been out of his prescription for primidone which did help partially. He has not seen Dr. Anne Hahn in over a year. He has had serous problems with urinary retention and hematuria. He has not had recurrent GI bleeding. He takes only aspirin for stroke prevention because of history of serious gastrointestinal hemorrhage. He has not had any symptoms to suggest a transient ischemic attack or stroke or other embolic events. His functional status is mediocre to poor.  Allergies  Allergen Reactions  . Penicillins Anaphylaxis and Swelling    "swell up all over my body; throat mainly, I think" (07/13/2012)  . Chlorhexidine Gluconate Itching    Chg cloth "don't remember how bad it was" (07/13/2012)    Current Outpatient Prescriptions  Medication Sig Dispense Refill  . albuterol (PROAIR HFA) 108 (90 BASE) MCG/ACT inhaler Inhale 1-2 puffs into the lungs every 6 (six) hours as needed. For shortness of breath      . albuterol (PROVENTIL) (2.5 MG/3ML) 0.083% nebulizer solution Take 2.5 mg by nebulization as needed.       Marland Kitchen aspirin EC 81 MG tablet Take 81 mg by mouth daily.        . budesonide (PULMICORT) 0.5 MG/2ML nebulizer solution Take 0.5 mg by nebulization as needed.       . diphenoxylate-atropine (LOMOTIL) 2.5-0.025 MG per tablet Take 1 tablet by mouth 4 (four) times daily as needed for diarrhea or loose stools.      . insulin NPH-regular (RELION 70/30) (70-30) 100 UNIT/ML injection Inject  42-46 Units into the skin 2 (two) times daily with a meal.      . isosorbide mononitrate (IMDUR) 30 MG 24 hr tablet Take 30 mg by mouth daily.        . Magnesium 250 MG TABS Take 250 mg by mouth daily.       . metoprolol tartrate (LOPRESSOR) 25 MG tablet Take 1 tablet (25 mg total) by mouth 2 (two) times daily.  180 tablet  3  . nitroGLYCERIN (NITROSTAT) 0.4 MG SL tablet Place 0.4 mg under the tongue every 5 (five) minutes as needed. For chest pain      . primidone (MYSOLINE) 50 MG tablet Take 1-2 tablets (50-100 mg total) by mouth 2 (two) times daily. 1 tab in the morning and 2 tabs at night  90 tablet  2  . spironolactone (ALDACTONE) 25 MG tablet Take 1/2 tablet po twice daily  90 tablet  3  . warfarin (COUMADIN) 10 MG tablet Take 10 mg by mouth See admin instructions. Takes 10mg  (1 tab) on Tuesday, Wednesday, Thursday, Saturday, Sunday      . citalopram (CELEXA) 40 MG tablet Take 40 mg by mouth daily.      . furosemide (LASIX) 40 MG tablet Take 40 mg by mouth.      . simvastatin (ZOCOR) 80 MG tablet Take 1/2 tab (40mg ) each night  45 tablet  2   No current facility-administered  medications for this visit.    Past Medical History  Diagnosis Date  . Hyperlipidemia   . Hypertension   . Complete heart block     s/p PPM  . Atrial fibrillation 05/09/2012    on chronic coumadin; Lexiscan - normal myocardial perfusion scan with a post-stress EF of 58%, pharmalogical stress without chest pain or EKG changes for ischemia  . Ischemic cardiomyopathy     EF 45% by echo 2010  . COPD (chronic obstructive pulmonary disease) 02/04/2010    Dobutamine Myocardial Perfusion - normal myocardial perfusion study, EKG negative for ischemia  . On home O2     "24/7"  . Renal insufficiency   . Tremor   . Angina pectoris   . Obesity, morbid 06/15/2011  . Coronary artery disease 05/09/2012    2D Echo - EF >50%, left atrium severely dilated, moderate septal hypokinesis  . Complication of anesthesia      "sometimes I wake up during OR"  . Myocardial infarct ? 1995  . Blood transfusion ?2011    "hemorrhaging out my penis" (07/13/2012)  . Anemia   . Hematuria   . Ulcer     "don't know what kind; it was years ago"  . Stress     "wife's been dx'd w/stage IV bone cancer"  . Recurrent coronary arteriosclerosis following PTCA 06/17/2011  . Pacemaker 2010  . Peripheral vascular disease     "bad in lower legs" (07/13/2012)  . CHF (congestive heart failure) 04/29/2009    2D Echo - EF 45-50%, normal  . Shortness of breath     "all the time" (07/13/2012)  . Obstructive sleep apnea on CPAP   . Type II diabetes mellitus   . GERD (gastroesophageal reflux disease)   . Stomach ulcer 1980's  . Arthritis     "lower back" (07/13/2012)  . Chronic lower back pain   . Hematuria ? 2011  . PAD (peripheral artery disease) 05/09/2012    Lower Arterial Exam - Right and left distal external iliac arteries-equal to or less than 50% diameter reductions, Bilateral SFA-suggestive 50-69% diameter reduction,right posterior tibial artery and left peroneal artery both occluded  . Bruit 07/23/2010    Carotid Duplex - Right and left ICAs demonstrate small amount of fibrous plaque not hemodynamically significant  . Shortness of breath on exertion 12/12/2003    Nuclear stress test - very low risk study and ischemia, if present, is minimal and doesn't reach statistical significance    Past Surgical History  Procedure Laterality Date  . Coronary stent placement      x 8  . Back surgery      x3  . Coronary angioplasty    . Coronary stent placement  10/2010    Has stents in LAD, RCA,LCX  . Coronary angioplasty with stent placement      "? total of 9 stents over the years; 6 at one time"  . Tonsillectomy      "I was a little boy" (07/13/2012)  . Appendectomy  1954  . Cataract extraction w/ intraocular lens  implant, bilateral  ~ 2011  . Insert / replace / remove pacemaker  07/2008  . Lumbar laminectomy  1990's     "had an extra lower lumbar; had it taken out" (07/13/2012)  . Lumbar disc surgery  1990's X    "cleaned up mess from 1st then 2nd OR" (07/13/2012)  . Hemorrhoid surgery      "in the dr's office; don't know if they cut them  out or banded them"  . Transesophageal echocardiogram  11/13/2004    EF 45-50%, no evidence of intracardiac mass or thrombus, does appear to be a small AST with mild left-to-right and right-to-left shunting  . Cardiac catheterization  06/16/2011    RCA 90% stenosis 3.5x24mm noncompliant Trek balloon was used for high-pressure noncompliant dilation up to approximately 3.51 proximally and 3.4 distally entire segment was reduced to less than 5%  . Cardiac catheterization  10/27/2010    95% stenosis of the distal RCA, 3x75mm bare-metal non-DES Medtronic Integrity stent to overlap stenosis, post-dilated with a 3.5x21 [place] Sprinter balloon to 3.44mm. As balloon was pulled back, there was progressive increase in dilation to 3.81mm  . Cardiac catheterization  10/26/2010    RCA - discrete 95% proximal in-stent restenosis, mid vessel 80%discrete stenosis;   . Cardiac catheterization  07/30/2008    2x91mm angioscope balloon cuts 3 to 5 atmospheric pressure    Family History  Problem Relation Age of Onset  . Heart disease Father   . Heart disease Brother   . Uterine cancer Mother   . Diabetes Sister     History   Social History  . Marital Status: Married    Spouse Name: N/A    Number of Children: N/A  . Years of Education: N/A   Occupational History  . retired [employer] architect    Social History Main Topics  . Smoking status: Former Smoker -- 2.00 packs/day for 52 years    Types: Cigarettes    Quit date: 07/26/2006  . Smokeless tobacco: Never Used  . Alcohol Use: Yes     Comment: 07/13/2012"did drink at one time for 5 years; stopped ~ 1980"  . Drug Use: No  . Sexually Active: No   Other Topics Concern  . Not on file   Social History Narrative  . No narrative on file     Review of systems:  He denies problems with dyspnea at rest, orthopnea, paroxysmal: Dyspnea, lower extremity edema, chest pain at rest or with exertion, syncope, palpitations, bleeding (other than hematuria). He has problems both with urinary retention and leakage. He is wearing a diaper. Most of his complaints are related to his inability to hold objects in his hand due to the profound tremor. Ironically the tremor is worse in his dominant right hand    PHYSICAL EXAM BP 134/70  Pulse 58  Resp 16  Ht 5' 11.5" (1.816 m)  Wt 274 lb (124.286 kg)  BMI 37.69 kg/m2 He is in a wheelchair, he is alert oriented in no distress  His exam is unremarkable pupils equal and reactive to light extraocular movements were intact the mouth ears nose and oropharynx are within normal limits He does not have evidence of jugular venous distention, hepatojugular reflux or carotid bruit or delay. He started his not enlarged his trachea is midline. His chest is clear to auscultation bilaterally although the breath sounds are diminished throughout. He does not have any wheezes or rales. He does not have signs of consultation the percussion and auscultation. Cardiac exam shows regular rate and rhythm normal S1 and a widely split S2 without any murmurs or gallops. An occasional ectopic beat is heard. A left subclavian pacemaker site looks healthy. The pulses are 2+ and symmetrical in the radials Hall-Morris brachials femorals and dorsalis pedis bilaterally the posterior tibials cannot be located His abdomen is soft nontender nondistended masses pulsatility or bruits. Bowel sounds are normal Extremities did not show clubbing cyanosis or edema. Has  prominent superficial veins in the right antecubital area.   his neurological exam is significant for dramatic resting tremor in the right upper extremity and mild to moderate resting tremor in the left upper extremity. Otherwise his neurological exam is nonfocal   EKG:  100% ventricular paced. Probable background atrial fibrillation but there is severe artifact from central tremor urine  Lipid Panel     Component Value Date/Time   CHOL 131 06/16/2011 0720   TRIG 144 06/16/2011 0720   HDL 26* 06/16/2011 0720   CHOLHDL 5.0 06/16/2011 0720   VLDL 29 06/16/2011 0720   LDLCALC 76 06/16/2011 0720    BMET    Component Value Date/Time   NA 137 10/24/2012 1310   K 3.7 10/24/2012 1310   CL 100 10/24/2012 1310   CO2 27 10/24/2012 1310   GLUCOSE 127* 10/24/2012 1310   BUN 21 10/24/2012 1310   CREATININE 1.40* 10/24/2012 1310   CALCIUM 9.2 10/24/2012 1310   GFRNONAA 48* 10/24/2012 1310   GFRAA 56* 10/24/2012 1310     ASSESSMENT AND PLAN Tremor, essential He has been out of his prescription for primidone since she has not been able to see Dr. Anne Hahn recently. I gave him a couple of months of this medication but he needs to make a followup appointment with his neurologist.  CAD (coronary artery disease) Numerous previous stents most recently bare-metal stent to the right coronary artery in April 2012. Currently asymptomatic without any signs or symptoms of coronary insufficiency. He has preserved left ventricular systolic function  Chronic a-fib He is on appropriate anticoagulation with warfarin. He has been off primidone for 3 months. His INR today is 2.6. It is possible that when primidone is restarted he may need a slight increase in warfarin dosage. He has virtually 100% ventricular pacing, but is not truly pacemaker dependent. He has a ventricular rate of about 45 bpm;   despite the high percentage of ventricular pacing he does not have signs of congestive heart failure  Pacemaker Medtronic Adapta single-chamber pacemaker implanted January 2010, for paroxysmal complete heart block. Normal device function monitored in office only. He is not due her to try remote monitoring   Orders Placed This Encounter  Procedures  . EKG 12-Lead   Meds ordered this encounter   Medications  . primidone (MYSOLINE) 50 MG tablet    Sig: Take 1-2 tablets (50-100 mg total) by mouth 2 (two) times daily. 1 tab in the morning and 2 tabs at night    Dispense:  90 tablet    Refill:  2    Kegan Shepardson  Thurmon Fair, MD, [facility] [phone] office [phone] pager

## 2013-02-05 NOTE — Assessment & Plan Note (Signed)
Medtronic Adapta single-chamber pacemaker implanted January 2010, for paroxysmal complete heart block. Normal device function monitored in office only. He is not due her to try remote monitoring

## 2013-02-05 NOTE — Assessment & Plan Note (Signed)
He is on appropriate anticoagulation with warfarin. He has been off primidone for 3 months. His INR today is 2.6. It is possible that when primidone is restarted he may need a slight increase in warfarin dosage. He has virtually 100% ventricular pacing, but is not truly pacemaker dependent. He has a ventricular rate of about 45 bpm;   despite the high percentage of ventricular pacing he does not have signs of congestive heart failure

## 2013-02-05 NOTE — Patient Instructions (Addendum)
Your physician recommends that you schedule a follow-up appointment in: 1 year with Dr.Croitoru, and with Dr. Tresa Endo in November. Your pacemaker will be checked at your OV with Dr.Kelly.

## 2013-02-05 NOTE — Assessment & Plan Note (Signed)
Numerous previous stents most recently bare-metal stent to the right coronary artery in April 2012. Currently asymptomatic without any signs or symptoms of coronary insufficiency. He has preserved left ventricular systolic function

## 2013-03-06 ENCOUNTER — Ambulatory Visit (INDEPENDENT_AMBULATORY_CARE_PROVIDER_SITE_OTHER): Payer: Medicare Other | Admitting: Pharmacist Clinician (PhC)/ Clinical Pharmacy Specialist

## 2013-03-06 VITALS — BP 132/60 | HR 76

## 2013-03-06 DIAGNOSIS — I4891 Unspecified atrial fibrillation: Secondary | ICD-10-CM

## 2013-03-06 DIAGNOSIS — Z7901 Long term (current) use of anticoagulants: Secondary | ICD-10-CM

## 2013-03-06 DIAGNOSIS — I482 Chronic atrial fibrillation, unspecified: Secondary | ICD-10-CM

## 2013-03-06 LAB — POCT INR: INR: 1.6

## 2013-03-23 ENCOUNTER — Telehealth: Payer: Self-pay | Admitting: Oncology

## 2013-03-27 NOTE — Telephone Encounter (Signed)
Open in error

## 2013-04-03 ENCOUNTER — Other Ambulatory Visit: Payer: Self-pay

## 2013-04-03 ENCOUNTER — Inpatient Hospital Stay (HOSPITAL_COMMUNITY)
Admission: EM | Admit: 2013-04-03 | Discharge: 2013-04-12 | DRG: 308 | Disposition: A | Discharge status: Skilled Nursing Facility | Payer: Medicare Other | Attending: Internal Medicine | Admitting: Internal Medicine

## 2013-04-03 ENCOUNTER — Telehealth: Payer: Self-pay | Admitting: Cardiovascular Disease

## 2013-04-03 ENCOUNTER — Encounter (HOSPITAL_COMMUNITY): Payer: Self-pay | Admitting: *Deleted

## 2013-04-03 ENCOUNTER — Ambulatory Visit: Payer: Medicare Other | Admitting: Pharmacist Clinician (PhC)/ Clinical Pharmacy Specialist

## 2013-04-03 ENCOUNTER — Emergency Department (HOSPITAL_COMMUNITY): Payer: Medicare Other

## 2013-04-03 DIAGNOSIS — K648 Other hemorrhoids: Secondary | ICD-10-CM | POA: Diagnosis present

## 2013-04-03 DIAGNOSIS — K5900 Constipation, unspecified: Secondary | ICD-10-CM | POA: Diagnosis present

## 2013-04-03 DIAGNOSIS — R0789 Other chest pain: Secondary | ICD-10-CM | POA: Diagnosis present

## 2013-04-03 DIAGNOSIS — M129 Arthropathy, unspecified: Secondary | ICD-10-CM | POA: Diagnosis present

## 2013-04-03 DIAGNOSIS — I482 Chronic atrial fibrillation, unspecified: Secondary | ICD-10-CM | POA: Diagnosis present

## 2013-04-03 DIAGNOSIS — G8929 Other chronic pain: Secondary | ICD-10-CM | POA: Diagnosis present

## 2013-04-03 DIAGNOSIS — I2589 Other forms of chronic ischemic heart disease: Secondary | ICD-10-CM | POA: Diagnosis present

## 2013-04-03 DIAGNOSIS — I509 Heart failure, unspecified: Secondary | ICD-10-CM | POA: Diagnosis present

## 2013-04-03 DIAGNOSIS — M722 Plantar fascial fibromatosis: Secondary | ICD-10-CM

## 2013-04-03 DIAGNOSIS — K5909 Other constipation: Secondary | ICD-10-CM | POA: Diagnosis present

## 2013-04-03 DIAGNOSIS — G4733 Obstructive sleep apnea (adult) (pediatric): Secondary | ICD-10-CM

## 2013-04-03 DIAGNOSIS — N39490 Overflow incontinence: Secondary | ICD-10-CM | POA: Diagnosis present

## 2013-04-03 DIAGNOSIS — E871 Hypo-osmolality and hyponatremia: Secondary | ICD-10-CM

## 2013-04-03 DIAGNOSIS — K633 Ulcer of intestine: Secondary | ICD-10-CM | POA: Diagnosis present

## 2013-04-03 DIAGNOSIS — M545 Low back pain, unspecified: Secondary | ICD-10-CM | POA: Diagnosis present

## 2013-04-03 DIAGNOSIS — E785 Hyperlipidemia, unspecified: Secondary | ICD-10-CM

## 2013-04-03 DIAGNOSIS — Z7901 Long term (current) use of anticoagulants: Secondary | ICD-10-CM

## 2013-04-03 DIAGNOSIS — K219 Gastro-esophageal reflux disease without esophagitis: Secondary | ICD-10-CM | POA: Diagnosis present

## 2013-04-03 DIAGNOSIS — E1165 Type 2 diabetes mellitus with hyperglycemia: Secondary | ICD-10-CM | POA: Diagnosis present

## 2013-04-03 DIAGNOSIS — Z794 Long term (current) use of insulin: Secondary | ICD-10-CM

## 2013-04-03 DIAGNOSIS — K529 Noninfective gastroenteritis and colitis, unspecified: Secondary | ICD-10-CM

## 2013-04-03 DIAGNOSIS — G250 Essential tremor: Secondary | ICD-10-CM | POA: Diagnosis present

## 2013-04-03 DIAGNOSIS — E43 Unspecified severe protein-calorie malnutrition: Secondary | ICD-10-CM

## 2013-04-03 DIAGNOSIS — R5381 Other malaise: Secondary | ICD-10-CM

## 2013-04-03 DIAGNOSIS — R197 Diarrhea, unspecified: Secondary | ICD-10-CM

## 2013-04-03 DIAGNOSIS — I2510 Atherosclerotic heart disease of native coronary artery without angina pectoris: Secondary | ICD-10-CM

## 2013-04-03 DIAGNOSIS — L02219 Cutaneous abscess of trunk, unspecified: Secondary | ICD-10-CM

## 2013-04-03 DIAGNOSIS — G20A1 Parkinson's disease without dyskinesia, without mention of fluctuations: Secondary | ICD-10-CM | POA: Diagnosis present

## 2013-04-03 DIAGNOSIS — Z950 Presence of cardiac pacemaker: Secondary | ICD-10-CM

## 2013-04-03 DIAGNOSIS — I10 Essential (primary) hypertension: Secondary | ICD-10-CM

## 2013-04-03 DIAGNOSIS — Z79899 Other long term (current) drug therapy: Secondary | ICD-10-CM

## 2013-04-03 DIAGNOSIS — R079 Chest pain, unspecified: Secondary | ICD-10-CM

## 2013-04-03 DIAGNOSIS — R140 Abdominal distension (gaseous): Secondary | ICD-10-CM

## 2013-04-03 DIAGNOSIS — R112 Nausea with vomiting, unspecified: Secondary | ICD-10-CM

## 2013-04-03 DIAGNOSIS — Z6834 Body mass index (BMI) 34.0-34.9, adult: Secondary | ICD-10-CM

## 2013-04-03 DIAGNOSIS — IMO0002 Reserved for concepts with insufficient information to code with codable children: Secondary | ICD-10-CM | POA: Diagnosis present

## 2013-04-03 DIAGNOSIS — E118 Type 2 diabetes mellitus with unspecified complications: Secondary | ICD-10-CM

## 2013-04-03 DIAGNOSIS — J9610 Chronic respiratory failure, unspecified whether with hypoxia or hypercapnia: Secondary | ICD-10-CM

## 2013-04-03 DIAGNOSIS — J449 Chronic obstructive pulmonary disease, unspecified: Secondary | ICD-10-CM

## 2013-04-03 DIAGNOSIS — I4891 Unspecified atrial fibrillation: Principal | ICD-10-CM

## 2013-04-03 DIAGNOSIS — I200 Unstable angina: Secondary | ICD-10-CM

## 2013-04-03 DIAGNOSIS — Z87891 Personal history of nicotine dependence: Secondary | ICD-10-CM

## 2013-04-03 DIAGNOSIS — Z9981 Dependence on supplemental oxygen: Secondary | ICD-10-CM

## 2013-04-03 DIAGNOSIS — I252 Old myocardial infarction: Secondary | ICD-10-CM

## 2013-04-03 DIAGNOSIS — J439 Emphysema, unspecified: Secondary | ICD-10-CM | POA: Diagnosis present

## 2013-04-03 DIAGNOSIS — I442 Atrioventricular block, complete: Secondary | ICD-10-CM | POA: Diagnosis present

## 2013-04-03 DIAGNOSIS — I739 Peripheral vascular disease, unspecified: Secondary | ICD-10-CM

## 2013-04-03 DIAGNOSIS — G20 Parkinson's disease: Secondary | ICD-10-CM | POA: Diagnosis present

## 2013-04-03 DIAGNOSIS — J4489 Other specified chronic obstructive pulmonary disease: Secondary | ICD-10-CM

## 2013-04-03 HISTORY — DX: Chronic atrial fibrillation, unspecified: I48.20

## 2013-04-03 LAB — PRO B NATRIURETIC PEPTIDE: Pro B Natriuretic peptide (BNP): 2587 pg/mL — ABNORMAL HIGH (ref 0–125)

## 2013-04-03 LAB — HEMOGLOBIN A1C
Hgb A1c MFr Bld: 8.3 % — ABNORMAL HIGH (ref <5.7)
Mean Plasma Glucose: 192 mg/dL — ABNORMAL HIGH (ref <117)

## 2013-04-03 LAB — CBC
Hemoglobin: 13.2 g/dL (ref 13.0–17.0)
MCHC: 33.7 g/dL (ref 30.0–36.0)
RDW: 16.4 % — ABNORMAL HIGH (ref 11.5–15.5)
WBC: 10.6 10*3/uL — ABNORMAL HIGH (ref 4.0–10.5)

## 2013-04-03 LAB — PROTIME-INR
INR: 2.53 — ABNORMAL HIGH (ref 0.00–1.49)
Prothrombin Time: 26.4 seconds — ABNORMAL HIGH (ref 11.6–15.2)

## 2013-04-03 LAB — TROPONIN I: Troponin I: <0.3 ng/mL (ref <0.30)

## 2013-04-03 LAB — BASIC METABOLIC PANEL
BUN: 20 mg/dL (ref 6–23)
GFR calc Af Amer: 75 mL/min — ABNORMAL LOW (ref >90)
GFR calc non Af Amer: 65 mL/min — ABNORMAL LOW (ref >90)
Potassium: 4.3 mEq/L (ref 3.5–5.1)

## 2013-04-03 LAB — MRSA PCR SCREENING: MRSA by PCR: NEGATIVE

## 2013-04-03 LAB — POCT I-STAT TROPONIN I: Troponin i, poc: 0.03 ng/mL (ref 0.00–0.08)

## 2013-04-03 MED ORDER — WARFARIN - PHARMACIST DOSING INPATIENT
Freq: Every day | Status: DC
  Administered 2013-04-04: 18:00:00

## 2013-04-03 MED ORDER — INFLUENZA VAC SPLIT QUAD 0.5 ML IM SUSP
0.5000 mL | INTRAMUSCULAR | Status: AC
  Filled 2013-04-03 (×2): qty 0.5

## 2013-04-03 MED ORDER — ATORVASTATIN CALCIUM 40 MG PO TABS
40.0000 mg | ORAL_TABLET | Freq: Every day | ORAL | Status: DC
  Administered 2013-04-03 – 2013-04-11 (×8): 40 mg via ORAL
  Filled 2013-04-03 (×11): qty 1

## 2013-04-03 MED ORDER — FUROSEMIDE 10 MG/ML IJ SOLN
40.0000 mg | Freq: Once | INTRAMUSCULAR | Status: AC
  Administered 2013-04-03: 40 mg via INTRAVENOUS
  Filled 2013-04-03: qty 4

## 2013-04-03 MED ORDER — FUROSEMIDE 10 MG/ML IJ SOLN: 40.0000 mg | Freq: Once | INTRAMUSCULAR | Status: DC

## 2013-04-03 MED ORDER — DIPHENOXYLATE-ATROPINE 2.5-0.025 MG PO TABS: 1.0000 | ORAL_TABLET | Freq: Four times a day (QID) | ORAL | Status: DC | PRN

## 2013-04-03 MED ORDER — ALBUTEROL SULFATE (5 MG/ML) 0.5% IN NEBU: 2.5000 mg | INHALATION_SOLUTION | Freq: Four times a day (QID) | RESPIRATORY_TRACT | Status: DC | PRN

## 2013-04-03 MED ORDER — NITROGLYCERIN 0.4 MG SL SUBL
0.4000 mg | SUBLINGUAL_TABLET | SUBLINGUAL | Status: DC | PRN
  Administered 2013-04-06 (×2): 0.4 mg via SUBLINGUAL
  Filled 2013-04-03: qty 25

## 2013-04-03 MED ORDER — MAGNESIUM OXIDE 400 (241.3 MG) MG PO TABS
200.0000 mg | ORAL_TABLET | Freq: Every day | ORAL | Status: DC
  Administered 2013-04-04 – 2013-04-12 (×8): 200 mg via ORAL
  Filled 2013-04-03 (×9): qty 0.5

## 2013-04-03 MED ORDER — NITROGLYCERIN IN D5W 200-5 MCG/ML-% IV SOLN
5.0000 ug/min | INTRAVENOUS | Status: DC
  Administered 2013-04-03: 5 ug/min via INTRAVENOUS
  Administered 2013-04-03: 10 ug/min via INTRAVENOUS
  Filled 2013-04-03: qty 250

## 2013-04-03 MED ORDER — PRIMIDONE 50 MG PO TABS
50.0000 mg | ORAL_TABLET | Freq: Two times a day (BID) | ORAL | Status: DC
  Administered 2013-04-03 – 2013-04-04 (×2): 50 mg via ORAL
  Filled 2013-04-03 (×3): qty 2

## 2013-04-03 MED ORDER — MAGNESIUM 250 MG PO TABS: 250.0000 mg | ORAL_TABLET | Freq: Every day | ORAL | Status: DC

## 2013-04-03 MED ORDER — METOPROLOL TARTRATE 25 MG PO TABS
25.0000 mg | ORAL_TABLET | Freq: Two times a day (BID) | ORAL | Status: DC
  Administered 2013-04-03 – 2013-04-12 (×14): 25 mg via ORAL
  Filled 2013-04-03 (×20): qty 1

## 2013-04-03 MED ORDER — ALBUTEROL SULFATE HFA 108 (90 BASE) MCG/ACT IN AERS
1.0000 | INHALATION_SPRAY | Freq: Four times a day (QID) | RESPIRATORY_TRACT | Status: DC | PRN
  Filled 2013-04-03: qty 6.7

## 2013-04-03 MED ORDER — BUDESONIDE 0.5 MG/2ML IN SUSP
0.5000 mg | Freq: Two times a day (BID) | RESPIRATORY_TRACT | Status: DC | PRN
  Filled 2013-04-03: qty 2

## 2013-04-03 MED ORDER — INSULIN ASPART PROT & ASPART (70-30 MIX) 100 UNIT/ML ~~LOC~~ SUSP
30.0000 [IU] | Freq: Two times a day (BID) | SUBCUTANEOUS | Status: DC
  Administered 2013-04-03 – 2013-04-07 (×6): 30 [IU] via SUBCUTANEOUS
  Filled 2013-04-03 (×2): qty 10

## 2013-04-03 MED ORDER — INSULIN ASPART 100 UNIT/ML ~~LOC~~ SOLN: 0.0000 [IU] | Freq: Every day | SUBCUTANEOUS | Status: DC

## 2013-04-03 MED ORDER — SODIUM CHLORIDE 0.9 % IV SOLN
INTRAVENOUS | Status: DC
  Administered 2013-04-08: 16:00:00 via INTRAVENOUS

## 2013-04-03 MED ORDER — SPIRONOLACTONE 12.5 MG HALF TABLET
12.5000 mg | ORAL_TABLET | Freq: Two times a day (BID) | ORAL | Status: DC
  Administered 2013-04-03 – 2013-04-12 (×15): 12.5 mg via ORAL
  Filled 2013-04-03 (×21): qty 1

## 2013-04-03 MED ORDER — CITALOPRAM HYDROBROMIDE 40 MG PO TABS
40.0000 mg | ORAL_TABLET | Freq: Every day | ORAL | Status: DC
  Administered 2013-04-04 – 2013-04-12 (×9): 40 mg via ORAL
  Filled 2013-04-03 (×9): qty 1

## 2013-04-03 MED ORDER — NITROGLYCERIN 0.4 MG SL SUBL
0.4000 mg | SUBLINGUAL_TABLET | SUBLINGUAL | Status: AC | PRN
  Administered 2013-04-06 (×3): 0.4 mg via SUBLINGUAL

## 2013-04-03 MED ORDER — ASPIRIN EC 81 MG PO TBEC
81.0000 mg | DELAYED_RELEASE_TABLET | Freq: Every day | ORAL | Status: DC
  Administered 2013-04-03 – 2013-04-12 (×10): 81 mg via ORAL
  Filled 2013-04-03 (×10): qty 1

## 2013-04-03 MED ORDER — INSULIN ASPART 100 UNIT/ML ~~LOC~~ SOLN
0.0000 [IU] | Freq: Three times a day (TID) | SUBCUTANEOUS | Status: DC
  Administered 2013-04-05 – 2013-04-08 (×2): 2 [IU] via SUBCUTANEOUS
  Administered 2013-04-09: 3 [IU] via SUBCUTANEOUS
  Administered 2013-04-09 (×2): 2 [IU] via SUBCUTANEOUS
  Administered 2013-04-10 (×3): 3 [IU] via SUBCUTANEOUS
  Administered 2013-04-11: 8 [IU] via SUBCUTANEOUS
  Administered 2013-04-11: 09:00:00 3 [IU] via SUBCUTANEOUS
  Administered 2013-04-12: 09:00:00 2 [IU] via SUBCUTANEOUS
  Administered 2013-04-12: 12:00:00 5 [IU] via SUBCUTANEOUS

## 2013-04-03 MED ORDER — ONDANSETRON HCL 4 MG/2ML IJ SOLN
8.0000 mg | Freq: Three times a day (TID) | INTRAMUSCULAR | Status: DC | PRN
  Administered 2013-04-04 – 2013-04-08 (×4): 8 mg via INTRAVENOUS
  Filled 2013-04-03 (×5): qty 4

## 2013-04-03 MED ORDER — WARFARIN SODIUM 5 MG PO TABS: 5.0000 mg | ORAL_TABLET | Freq: Every day | ORAL | Status: DC

## 2013-04-03 MED ORDER — FUROSEMIDE 40 MG PO TABS
40.0000 mg | ORAL_TABLET | Freq: Every day | ORAL | Status: DC
  Administered 2013-04-04 – 2013-04-07 (×4): 40 mg via ORAL
  Filled 2013-04-03 (×4): qty 1

## 2013-04-03 MED ORDER — PROMETHAZINE HCL 25 MG/ML IJ SOLN
25.0000 mg | Freq: Four times a day (QID) | INTRAMUSCULAR | Status: DC | PRN
  Administered 2013-04-04: 25 mg via INTRAVENOUS
  Filled 2013-04-03: qty 1

## 2013-04-03 NOTE — ED Notes (Signed)
Chest Pain started 2 hours ago.  Pt. Has a Pacemaker.  Pt. Has had ASA 325mg  and 3 Nitro Sprays.  Current pain is 3/10 down from 10/10

## 2013-04-03 NOTE — ED Notes (Signed)
Will hold Nitro at this time due to receiving 3 doses by EMS.

## 2013-04-03 NOTE — Progress Notes (Signed)
ANTICOAGULATION CONSULT NOTE - Follow-Up Consult  Pharmacy Consult:  Heparin Indication:  [place]  Patient Measurements: Height: 5' 11.5" (181.6 cm) Weight: 266 lb 12.1 oz (121 kg) IBW/kg (Calculated) : 76.45 Heparin Dosing Weight: 103 kg  Labs:  Recent Labs  04/03/13 1204 04/03/13 1737  HGB 13.2  --   HCT 39.2  --   PLT 198  --   LABPROT  --  26.4*  INR  --  2.53*  CREATININE 1.10  --   TROPONINI  --  <0.30   Assessment: 48 YOM with complaint of chest pain to start IV heparin for [place].  Of note, patient is on Coumadin PTA for history of Afib and he last took his dose yesterday 04/02/13.   INR this evening is 26.4/2.53.  I spoke with Dr. Royann Shivers regarding anticoagulation.  Will hold both Heparin and Warfarin this evening.  PLAN: 1.  Hold both Heparin and Warfarin tonight 2.  F/U plans for cardiac cath tomorrow or continuation of Warfarin therapy.  Nadara Mustard, PharmD., MS Clinical Pharmacist Pager:  [phone] Thank you for allowing pharmacy to be part of this patients care team. 04/03/2013, 7:01 PM

## 2013-04-03 NOTE — Telephone Encounter (Signed)
Returned call to [place], pt's daughter.  Stated pt with complaints below.  Also c/o feeling heaviness in chest now and has taken 1 NTG.  Asked her to have pt take 2nd NTG now as pt also c/o numbness and tingling in L arm and call EMS to [facility].  Verbalized understanding and agreed w/ plan.  RN was able to hear pt talking in background.   Hinda Glatter, PA-C notified pt sent to ER.  Message forwarded to Dr. Tresa Endo [facility]).

## 2013-04-03 NOTE — H&P (Signed)
Patient ID: Bruce Taylor MRN: [id number], DOB/AGE: [birth year]   Admit date: 04/03/2013   Primary Physician: Daisy Floro, MD Primary Cardiologist: Dr Tresa Endo  HPI:   72 y/o former patient of Dr. Caprice Kluver who has established coronary artery disease. In the past, he has undergone multiple interventions and has stents placed in his LAD, diagonal and circumflex marginal vessel, as well as in the proximal to mid right coronary artery. In November 2012 Dr Tresa Endo performed a successful cutting balloon atherotomy for diffuse in-stent restenosis in the stents to his right coronary artery. He also has a history of chronic atrial fibrillation, and a history of permanent pacemaker for bradycardia. He has been on chronic Coumadin therapy. He also has history of significant tremor, right arm greater than left, not felt to be due to Parkinson disease for which he is seen by Dr. Anne Hahn. He has not benefitted from medical therapy. He has significant COPD and is chronic O2. He is morbidly obese and deconditioned. He has chronic urinar incontinence and wears a diaper at home. He has had periods of SNF placement. He had been living at home until recntly when he had to move into his daughter's house because his wife was admitted to [facility] with "stage 4 breast cancer".            Today he complained to his family of some chest pain and took some NTG.  His history to Dr Allyson Sabal and I was disjointed and confusion. It does sound like he had some type of chest pain though now it sounds atypical, ""crawling, fluttering on my chest". He is admitted for further observation and to rule out MI.  I suspect he will need a Social Work consult for evaluation of his new home situation.    Problem List: Past Medical History  Diagnosis Date  . Hyperlipidemia   . Hypertension   . 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 Chronic atrial fibrillation   . Ischemic cardiomyopathy     EF 45% by echo 2010  . COPD (chronic  obstructive pulmonary disease) 02/04/2010  . On home O2     "24/7"  . Renal insufficiency   . Tremor   . Angina pectoris   . Obesity, morbid 06/15/2011  . Coronary artery disease     Multiple PCIs  . Myocardial infarct ? 1995  . Blood transfusion ?2011    "hemorrhaging out my penis" (07/13/2012)  . Anemia   . Ulcer     "don't know what kind; it was years ago"  . Pacemaker 2010  . CHF (congestive heart failure) 04/29/2009    2D Echo - EF 45-50%, normal  . Shortness of breath     "all the time" (07/13/2012)  . Obstructive sleep apnea on CPAP   . Type II diabetes mellitus   . GERD (gastroesophageal reflux disease)   . Stomach ulcer 1980's  . Arthritis     "lower back" (07/13/2012)  . Chronic lower back pain   . Hematuria ? 2011  . PAD (peripheral artery disease) 05/09/2012    Lower Arterial Exam - Right and left distal external iliac arteries-equal to or less than 50% diameter reductions, Bilateral SFA-suggestive 50-69% diameter reduction,right posterior tibial artery and left peroneal artery both occluded  . Bruit 07/23/2010    Carotid Duplex - Right and left ICAs demonstrate small amount of fibrous plaque not hemodynamically significant    Past Surgical History  Procedure Laterality Date  . Coronary  stent placement      x 8  . Back surgery      x3  . Coronary angioplasty    . Coronary stent placement  10/2010    Has stents in LAD, RCA,LCX  . Coronary angioplasty with stent placement      "? total of 9 stents over the years; 6 at one time"  . Tonsillectomy      "I was a little boy" (07/13/2012)  . Appendectomy  1954  . Cataract extraction w/ intraocular lens  implant, bilateral  ~ 2011  . Insert / replace / remove pacemaker  07/2008  . Lumbar laminectomy  1990's    "had an extra lower lumbar; had it taken out" (07/13/2012)  . Lumbar disc surgery  1990's X    "cleaned up mess from 1st then 2nd OR" (07/13/2012)  . Hemorrhoid surgery      "in the dr's office; don't know  if they cut them out or banded them"  . Transesophageal echocardiogram  11/13/2004    EF 45-50%, no evidence of intracardiac mass or thrombus, does appear to be a small AST with mild left-to-right and right-to-left shunting  . Cardiac catheterization  06/16/2011    RCA 90% stenosis 3.5x70mm noncompliant Trek balloon was used for high-pressure noncompliant dilation up to approximately 3.51 proximally and 3.4 distally entire segment was reduced to less than 5%  . Cardiac catheterization  10/27/2010    95% stenosis of the distal RCA, 3x38mm bare-metal non-DES Medtronic Integrity stent to overlap stenosis, post-dilated with a 3.5x21 [place] Sprinter balloon to 3.61mm. As balloon was pulled back, there was progressive increase in dilation to 3.71mm  . Cardiac catheterization  10/26/2010    RCA - discrete 95% proximal in-stent restenosis, mid vessel 80%discrete stenosis;   . Cardiac catheterization  07/30/2008    2x33mm angioscope balloon cuts 3 to 5 atmospheric pressure     Allergies:  Allergies  Allergen Reactions  . Penicillins Anaphylaxis and Swelling    "swell up all over my body; throat mainly, I think" (07/13/2012)  . Chlorhexidine Gluconate Itching    Chg cloth "don't remember how bad it was" (07/13/2012)     Home Medications Current Facility-Administered Medications  Medication Dose Route Frequency Provider Last Rate Last Dose  . nitroGLYCERIN (NITROSTAT) SL tablet 0.4 mg  0.4 mg Sublingual Q5 min PRN Kristen N Ward, DO      . nitroGLYCERIN 0.2 mg/mL in dextrose 5 % infusion  5 mcg/min Intravenous Titrated Kristen N Ward, DO       Current Outpatient Prescriptions  Medication Sig Dispense Refill  . albuterol (PROAIR HFA) 108 (90 BASE) MCG/ACT inhaler Inhale 1-2 puffs into the lungs every 6 (six) hours as needed. For shortness of breath      . albuterol (PROVENTIL) (2.5 MG/3ML) 0.083% nebulizer solution Take 2.5 mg by nebulization as needed.       Marland Kitchen aspirin EC 81 MG tablet Take 81 mg by mouth  daily.        . budesonide (PULMICORT) 0.5 MG/2ML nebulizer solution Take 0.5 mg by nebulization as needed.       . citalopram (CELEXA) 40 MG tablet Take 40 mg by mouth daily.      . diphenoxylate-atropine (LOMOTIL) 2.5-0.025 MG per tablet Take 1 tablet by mouth 4 (four) times daily as needed for diarrhea or loose stools.      . furosemide (LASIX) 40 MG tablet Take 40 mg by mouth.      . insulin  NPH-regular (RELION 70/30) (70-30) 100 UNIT/ML injection Inject 42-46 Units into the skin 2 (two) times daily with a meal.      . isosorbide mononitrate (IMDUR) 30 MG 24 hr tablet Take 30 mg by mouth daily.        . Magnesium 250 MG TABS Take 250 mg by mouth daily.       . metoprolol tartrate (LOPRESSOR) 25 MG tablet Take 1 tablet (25 mg total) by mouth 2 (two) times daily.  180 tablet  3  . nitroGLYCERIN (NITROSTAT) 0.4 MG SL tablet Place 0.4 mg under the tongue every 5 (five) minutes as needed. For chest pain      . primidone (MYSOLINE) 50 MG tablet Take 1-2 tablets (50-100 mg total) by mouth 2 (two) times daily. 1 tab in the morning and 2 tabs at night  90 tablet  2  . simvastatin (ZOCOR) 80 MG tablet Take 1/2 tab (40mg ) each night  45 tablet  2  . spironolactone (ALDACTONE) 25 MG tablet Take 1/2 tablet po twice daily  90 tablet  3  . warfarin (COUMADIN) 10 MG tablet Take 5-10 mg by mouth daily. Takes 10mg  (1 tab) on Tuesday, Wednesday, Thursday, Saturday, Sunday, 5 mg mon, fri         Family History  Problem Relation Age of Onset  . Heart disease Father   . Heart disease Brother   . Uterine cancer Mother   . Diabetes Sister      History   Social History  . Marital Status: Married    Spouse Name: N/A    Number of Children: N/A  . Years of Education: N/A   Occupational History  . retired Naval architect    Social History Main Topics  . Smoking status: Former Smoker -- 2.00 packs/day for 52 years    Types: Cigarettes    Quit date: 07/26/2006  . Smokeless tobacco: Never Used  . Alcohol  Use: Yes     Comment: 07/13/2012"did drink at one time for 5 years; stopped ~ 1980"  . Drug Use: No  . Sexual Activity: No   Other Topics Concern  . Not on file   Social History Narrative  . No narrative on file     Review of Systems: General: negative for chills, fever, night sweats or weight changes.  Cardiovascular: negative for chest pain, dyspnea on exertion, edema, orthopnea, palpitations, paroxysmal nocturnal dyspnea or shortness of breath Dermatological: negative for rash Respiratory: negative for cough or wheezing Urologic: negative for hematuria Abdominal: negative for nausea, vomiting, diarrhea, bright red blood per rectum, melena, or hematemesis Neurologic: negative for visual changes, syncope, or dizziness All other systems reviewed and are otherwise negative except as noted above.  Physical Exam: Blood pressure 114/80, pulse 62, temperature 97.7 F (36.5 C), temperature source Oral, resp. rate 18, height 5' 11.65" (1.82 m), weight 274 lb 0.5 oz (124.3 kg), SpO2 95.00%.  General appearance: alert, cooperative, no distress and morbidly obese Neck: no carotid bruit and no JVD Lungs: clear to auscultation bilaterally Heart: irregularly irregular rhythm Abdomen: obese, non tender Extremities: no edema, faint distal pulses Pulses: diminnished Skin: pale, cool, and dry Neurologic: Grossly normal, he has a significant resting tremor of both upper extremities.    Labs:   Results for orders placed during the hospital encounter of 04/03/13 (from the past 24 hour(s))  BASIC METABOLIC PANEL     Status: Abnormal   Collection Time    04/03/13 12:04 PM  Result Value Range   Sodium 134 (*) 135 - 145 mEq/L   Potassium 4.3  3.5 - 5.1 mEq/L   Chloride 100  96 - 112 mEq/L   CO2 21  19 - 32 mEq/L   Glucose, Bld 109 (*) 70 - 99 mg/dL   BUN 20  6 - 23 mg/dL   Creatinine, Ser 1.61  0.50 - 1.35 mg/dL   Calcium 9.1  8.4 - 09.6 mg/dL   GFR calc non Af Amer 65 (*) >90 mL/min    GFR calc Af Amer 75 (*) >90 mL/min  CBC     Status: Abnormal   Collection Time    04/03/13 12:04 PM      Result Value Range   WBC 10.6 (*) 4.0 - 10.5 K/uL   RBC 4.45  4.22 - 5.81 MIL/uL   Hemoglobin 13.2  13.0 - 17.0 g/dL   HCT 04.5  40.9 - 81.1 %   MCV 88.1  78.0 - 100.0 fL   MCH 29.7  26.0 - 34.0 pg   MCHC 33.7  30.0 - 36.0 g/dL   RDW 91.4 (*) 78.2 - 95.6 %   Platelets 198  150 - 400 K/uL  PRO B NATRIURETIC PEPTIDE     Status: Abnormal   Collection Time    04/03/13 12:04 PM      Result Value Range   Pro B Natriuretic peptide (BNP) 2587.0 (*) 0 - 125 pg/mL  POCT I-STAT TROPONIN I     Status: None   Collection Time    04/03/13 12:42 PM      Result Value Range   Troponin i, poc 0.03  0.00 - 0.08 ng/mL   Comment 3              Radiology/Studies: Dg Chest 2 View  04/03/2013   *RADIOLOGY REPORT*  Clinical Data: Chest pain and shortness of breath.  Hypertension.  CHEST - 2 VIEW  Comparison: 07/13/2012  Findings: The cardiac silhouette is mildly enlarged.  No mediastinal or hilar masses are appreciated.  There is central vascular congestion with mild interstitial thickening and minimal pleural effusions.  The combination is consistent with congestive heart failure.  IMPRESSION: Mild congestive heart failure.   Original Report Authenticated By: Amie Portland, M.D.    EKG: AF/RBBB  ASSESSMENT AND PLAN:  Principal Problem:   Unstable angina Active Problems:   CAD, multiple PCIs, last PCI Nov 2012 to his RCA for ISR. Myoview low risk May 2014   DM (diabetes mellitus), type 2 with complications   Long term (current) use of anticoagulants- Coumadin   HYPERLIPIDEMIA   HYPERTENSION   COPD- chronic O2   Chronic respiratory failure   OSA (obstructive sleep apnea)- on C-pap   Obesity, morbid   Chronic a-fib   Pacemaker- MDT placed Jan 2010. Just checked by Dr Royann Shivers   Tremor, essential   PLAN: Pt seen by Dr Allyson Sabal and myself in the ER, He is on Coumadin so will not anticoagulate  till his INR is back, but OK to start IV NTG and cycle Troponin X3. NPO after midnight, if Troponin negative will consider a repeat Myoview, if positive consider cath. Lasix IV X 1 now as his BMP is slightly elevated, (2587), and CXR suggests mild CHF. Will cut his daily 70/30 insulin back to 30 units BID and use sliding scale coverage now that he is on hospital food.    Deland Pretty, PA-C 04/03/2013, 2:57 PM Agree with note written by Franky Macho  [place] PAC  Hx difficult to obtain and disjointed. H/O CAD s/p multiple PCI/Stent procedures in past. CAF on coumadin A/C. Pt admitted with ? CP and back pain. Currently pain free. He has a pronounced R>LUE tremor. CXR c/w CHF and BNP mildly elevated. EKF CAF with paced rhythm but no acute changes. Enz neg. Not really sure what brought him in but with ROMI and possibly perform an MPI to risk stratify.   Runell Gess 04/03/2013 4:40 PM

## 2013-04-03 NOTE — Progress Notes (Signed)
Nurse called to room. Patient c/o 7/10 chest pain. EKG obtained. Nitro titrated. Patient now 4/10 chest pain which has been his baseline upon admission to [facility]. Will continue to monitor.

## 2013-04-03 NOTE — ED Provider Notes (Signed)
TIME SEEN: 11:42 AM  CHIEF COMPLAINT: Chest pain, shortness of breath  HPI: Patient is a 72 year old male with a history of hypertension, diabetes, hyperlipidemia, atrial fibrillation on aspirin, chronic kidney disease, CAD with multiple stents, ischemic cardiomyopathy status post pacemaker who presents to the emergency department with chest pain and shortness of breath. He reports his chest pain has been progressively get worse over the last several weeks.  He reports it normally comes on with exertion and recently has been coming on with less and less exertion. Today it came on at rest. He reports some improvement in his pain with nitroglycerin. He describes as a pressure pain that radiates into his left arm. He's also had shortness of breath and dizziness. No nausea or diaphoresis. He states this feels like his prior pain when he has had to have stents placed. He reports his last cardiac catheterization was between 1-2 years ago. He denies any fever or cough. No prior history of PE or DVT. No increased lower extremity swelling or pain.  ROS: See HPI Constitutional: no fever  Eyes: no drainage  ENT: no runny nose   Cardiovascular:   chest pain  Resp:  SOB  GI: no vomiting GU: no dysuria Integumentary: no rash  Allergy: no hives  Musculoskeletal: no leg swelling  Neurological: no slurred speech ROS otherwise negative  PAST MEDICAL HISTORY/PAST SURGICAL HISTORY:  Past Medical History  Diagnosis Date  . Hyperlipidemia   . Hypertension   . Complete heart block     s/p PPM  . Atrial fibrillation 05/09/2012    on chronic coumadin; Lexiscan - normal myocardial perfusion scan with a post-stress EF of 58%, pharmalogical stress without chest pain or EKG changes for ischemia  . Ischemic cardiomyopathy     EF 45% by echo 2010  . COPD (chronic obstructive pulmonary disease) 02/04/2010    Dobutamine Myocardial Perfusion - normal myocardial perfusion study, EKG negative for ischemia  . On home O2      "24/7"  . Renal insufficiency   . Tremor   . Angina pectoris   . Obesity, morbid 06/15/2011  . Coronary artery disease 05/09/2012    2D Echo - EF >50%, left atrium severely dilated, moderate septal hypokinesis  . Complication of anesthesia     "sometimes I wake up during OR"  . Myocardial infarct ? 1995  . Blood transfusion ?2011    "hemorrhaging out my penis" (07/13/2012)  . Anemia   . Hematuria   . Ulcer     "don't know what kind; it was years ago"  . Stress     "wife's been dx'd w/stage IV bone cancer"  . Recurrent coronary arteriosclerosis following PTCA 06/17/2011  . Pacemaker 2010  . Peripheral vascular disease     "bad in lower legs" (07/13/2012)  . CHF (congestive heart failure) 04/29/2009    2D Echo - EF 45-50%, normal  . Shortness of breath     "all the time" (07/13/2012)  . Obstructive sleep apnea on CPAP   . Type II diabetes mellitus   . GERD (gastroesophageal reflux disease)   . Stomach ulcer 1980's  . Arthritis     "lower back" (07/13/2012)  . Chronic lower back pain   . Hematuria ? 2011  . PAD (peripheral artery disease) 05/09/2012    Lower Arterial Exam - Right and left distal external iliac arteries-equal to or less than 50% diameter reductions, Bilateral SFA-suggestive 50-69% diameter reduction,right posterior tibial artery and left peroneal artery both occluded  .  Bruit 07/23/2010    Carotid Duplex - Right and left ICAs demonstrate small amount of fibrous plaque not hemodynamically significant  . Shortness of breath on exertion 12/12/2003    Nuclear stress test - very low risk study and ischemia, if present, is minimal and doesn't reach statistical significance    MEDICATIONS:  Prior to Admission medications   Medication Sig Start Date End Date Taking? Authorizing Provider  albuterol (PROAIR HFA) 108 (90 BASE) MCG/ACT inhaler Inhale 1-2 puffs into the lungs every 6 (six) hours as needed. For shortness of breath    Historical Provider, MD  albuterol  (PROVENTIL) (2.5 MG/3ML) 0.083% nebulizer solution Take 2.5 mg by nebulization as needed.     Barbaraann Share, MD  aspirin EC 81 MG tablet Take 81 mg by mouth daily.      Historical Provider, MD  budesonide (PULMICORT) 0.5 MG/2ML nebulizer solution Take 0.5 mg by nebulization as needed.     Barbaraann Share, MD  citalopram (CELEXA) 40 MG tablet Take 40 mg by mouth daily.    Historical Provider, MD  diphenoxylate-atropine (LOMOTIL) 2.5-0.025 MG per tablet Take 1 tablet by mouth 4 (four) times daily as needed for diarrhea or loose stools.    Historical Provider, MD  furosemide (LASIX) 40 MG tablet Take 40 mg by mouth.    Historical Provider, MD  insulin NPH-regular (RELION 70/30) (70-30) 100 UNIT/ML injection Inject 42-46 Units into the skin 2 (two) times daily with a meal.    Historical Provider, MD  isosorbide mononitrate (IMDUR) 30 MG 24 hr tablet Take 30 mg by mouth daily.      Historical Provider, MD  Magnesium 250 MG TABS Take 250 mg by mouth daily.     Historical Provider, MD  metoprolol tartrate (LOPRESSOR) 25 MG tablet Take 1 tablet (25 mg total) by mouth 2 (two) times daily. 12/14/12   Lennette Bihari, MD  nitroGLYCERIN (NITROSTAT) 0.4 MG SL tablet Place 0.4 mg under the tongue every 5 (five) minutes as needed. For chest pain    Historical Provider, MD  primidone (MYSOLINE) 50 MG tablet Take 1-2 tablets (50-100 mg total) by mouth 2 (two) times daily. 1 tab in the morning and 2 tabs at night 02/05/13   Mihai Croitoru, MD  simvastatin (ZOCOR) 80 MG tablet Take 1/2 tab (40mg ) each night 01/01/13   Lennette Bihari, MD  spironolactone (ALDACTONE) 25 MG tablet Take 1/2 tablet po twice daily 12/27/12   Lennette Bihari, MD  warfarin (COUMADIN) 10 MG tablet Take 10 mg by mouth See admin instructions. Takes 10mg  (1 tab) on Tuesday, Wednesday, Thursday, Saturday, Sunday    Historical Provider, MD    ALLERGIES:  Allergies  Allergen Reactions  . Penicillins Anaphylaxis and Swelling    "swell up all over my body;  throat mainly, I think" (07/13/2012)  . Chlorhexidine Gluconate Itching    Chg cloth "don't remember how bad it was" (07/13/2012)    SOCIAL HISTORY:  History  Substance Use Topics  . Smoking status: Former Smoker -- 2.00 packs/day for 52 years    Types: Cigarettes    Quit date: 07/26/2006  . Smokeless tobacco: Never Used  . Alcohol Use: Yes     Comment: 07/13/2012"did drink at one time for 5 years; stopped ~ 1980"    FAMILY HISTORY: Family History  Problem Relation Age of Onset  . Heart disease Father   . Heart disease Brother   . Uterine cancer Mother   . Diabetes Sister  EXAM: There were no vitals taken for this visit. CONSTITUTIONAL: Alert and oriented and responds appropriately to questions. Well-appearing; no apparent distress, morbidly obese HEAD: Normocephalic EYES: Conjunctivae clear, PERRL ENT: normal nose; no rhinorrhea; moist mucous membranes; pharynx without lesions noted NECK: Supple, no meningismus, no LAD  CARD: RRR; S1 and S2 appreciated; no murmurs, no clicks, no rubs, no gallops RESP: Normal chest excursion without splinting or tachypnea; breath sounds are equal bilaterally, crackles at bases bilaterally, no wheezing or rhonchi ABD/GI: Normal bowel sounds; non-distended; soft, non-tender, no rebound, no guarding BACK:  The back appears normal and is non-tender to palpation, there is no CVA tenderness EXT: Normal ROM in all joints; non-tender to palpation; mild bilateral nonpitting peripheral edema; normal capillary refill; no cyanosis    SKIN: Normal color for age and race; warm NEURO: Moves all extremities equally PSYCH: The patient's mood and manner are appropriate. Grooming and personal hygiene are appropriate.  MEDICAL DECISION MAKING: Patient with history of coronary artery disease who presents emergency department with chest pain at rest. Patient has a concerning story for unstable angina. Will attempt to get patient chest pain-free and discuss  with cardiology. Cardiac labs, chest x-ray pending.   Date: 04/03/2013 11:54 AM  Rate: 65  Rhythm:  Possible underlying atrial flutter  QRS Axis: normal  Intervals: Intraventricular conduction delay  ST/T Wave abnormalities: normal  Conduction Disutrbances: none  Narrative Interpretation: Intraventricular conduction delay, possible underlying atrial flutter, Q waves in inferior and lateral leads, unchanged compared to prior EKG, patient may be in a paced rhythm but is difficult to tell secondary to his essential tremor; no new ischemic changes      ED PROGRESS: Patient's BNP is elevated to 2587. His chest x-ray shows mild CHF. We'll give IV Lasix. His troponin is negative. EKG shows no new ischemic changes. Will discuss with [facility] for admission for possible unstable angina. Will start nitroglycerin drip and heparin drip.  2:07 PM  D/w Diona Fanti, PA with [facility] he will see the patient in the emergency department. Patient is still hemodynamically stable.  3:39 PM  Cards to admit.  Layla Maw Ward, DO 04/03/13 [id number]

## 2013-04-03 NOTE — ED Notes (Signed)
Pt.s home paperwork and medication list given to son.

## 2013-04-03 NOTE — Telephone Encounter (Signed)
Bruce Millet is calling regarding her father. He is having a mild chest pain and it feels like spiders are in his chest. She wants to know if he needs to go to the hospital.  The pain is not constant, he has back pain and he can't feel his feet hard time standing.

## 2013-04-03 NOTE — Progress Notes (Signed)
ANTICOAGULATION CONSULT NOTE - Initial Consult  Pharmacy Consult:  Heparin Indication:  [place]  Allergies  Allergen Reactions  . Penicillins Anaphylaxis and Swelling    "swell up all over my body; throat mainly, I think" (07/13/2012)  . Chlorhexidine Gluconate Itching    Chg cloth "don't remember how bad it was" (07/13/2012)    Patient Measurements: Height: 5' 11.65" (182 cm) Weight: 274 lb 0.5 oz (124.3 kg) IBW/kg (Calculated) : 76.8 Heparin Dosing Weight: 103 kg  Vital Signs: Temp: 97.7 F (36.5 C) (09/09 1204) Temp src: Oral (09/09 1204) BP: 114/80 mmHg (09/09 1204) Pulse Rate: 62 (09/09 1204)  Labs:  Recent Labs  04/03/13 1204  HGB 13.2  HCT 39.2  PLT 198  CREATININE 1.10    Estimated Creatinine Clearance: 81 ml/min (by C-G formula based on Cr of 1.1).   Medical History: Past Medical History  Diagnosis Date  . Hyperlipidemia   . Hypertension   . Complete heart block     s/p PPM  . Atrial fibrillation 05/09/2012    on chronic coumadin; Lexiscan - normal myocardial perfusion scan with a post-stress EF of 58%, pharmalogical stress without chest pain or EKG changes for ischemia  . Ischemic cardiomyopathy     EF 45% by echo 2010  . COPD (chronic obstructive pulmonary disease) 02/04/2010    Dobutamine Myocardial Perfusion - normal myocardial perfusion study, EKG negative for ischemia  . On home O2     "24/7"  . Renal insufficiency   . Tremor   . Angina pectoris   . Obesity, morbid 06/15/2011  . Coronary artery disease 05/09/2012    2D Echo - EF >50%, left atrium severely dilated, moderate septal hypokinesis  . Complication of anesthesia     "sometimes I wake up during OR"  . Myocardial infarct ? 1995  . Blood transfusion ?2011    "hemorrhaging out my penis" (07/13/2012)  . Anemia   . Hematuria   . Ulcer     "don't know what kind; it was years ago"  . Stress     "wife's been dx'd w/stage IV bone cancer"  . Recurrent coronary arteriosclerosis following  PTCA 06/17/2011  . Pacemaker 2010  . Peripheral vascular disease     "bad in lower legs" (07/13/2012)  . CHF (congestive heart failure) 04/29/2009    2D Echo - EF 45-50%, normal  . Shortness of breath     "all the time" (07/13/2012)  . Obstructive sleep apnea on CPAP   . Type II diabetes mellitus   . GERD (gastroesophageal reflux disease)   . Stomach ulcer 1980's  . Arthritis     "lower back" (07/13/2012)  . Chronic lower back pain   . Hematuria ? 2011  . PAD (peripheral artery disease) 05/09/2012    Lower Arterial Exam - Right and left distal external iliac arteries-equal to or less than 50% diameter reductions, Bilateral SFA-suggestive 50-69% diameter reduction,right posterior tibial artery and left peroneal artery both occluded  . Bruit 07/23/2010    Carotid Duplex - Right and left ICAs demonstrate small amount of fibrous plaque not hemodynamically significant  . Shortness of breath on exertion 12/12/2003    Nuclear stress test - very low risk study and ischemia, if present, is minimal and doesn't reach statistical significance       Assessment: 8 YOM with complaint of chest pain to start IV heparin for [place].  Of note, patient is on Coumadin PTA for history of Afib and he last took his dose  yesterday 04/02/13.   Available labs reviewed.   Goal of Therapy:  Heparin level 0.3-0.7 units/ml Monitor platelets by anticoagulation protocol: Yes    Plan:  - await INR prior to heparin initiation    Bruce Taylor D. Laney Potash, PharmD, BCPS Pager:  [phone] 04/03/2013, 2:33 PM

## 2013-04-03 NOTE — ED Notes (Signed)
Report given to RN

## 2013-04-04 ENCOUNTER — Inpatient Hospital Stay (HOSPITAL_COMMUNITY): Payer: Medicare Other

## 2013-04-04 ENCOUNTER — Encounter (HOSPITAL_COMMUNITY): Payer: Self-pay | Admitting: Radiology

## 2013-04-04 DIAGNOSIS — E785 Hyperlipidemia, unspecified: Secondary | ICD-10-CM

## 2013-04-04 DIAGNOSIS — E118 Type 2 diabetes mellitus with unspecified complications: Secondary | ICD-10-CM

## 2013-04-04 DIAGNOSIS — Z7901 Long term (current) use of anticoagulants: Secondary | ICD-10-CM

## 2013-04-04 LAB — CBC
HCT: 40.2 % (ref 39.0–52.0)
Hemoglobin: 13.5 g/dL (ref 13.0–17.0)
MCH: 29.3 pg (ref 26.0–34.0)
MCHC: 33.6 g/dL (ref 30.0–36.0)
MCV: 87.2 fL (ref 78.0–100.0)
Platelets: 195 10*3/uL (ref 150–400)
RBC: 4.61 MIL/uL (ref 4.22–5.81)
RDW: 16.3 % — ABNORMAL HIGH (ref 11.5–15.5)
WBC: 10.4 10*3/uL (ref 4.0–10.5)

## 2013-04-04 LAB — BASIC METABOLIC PANEL
BUN: 23 mg/dL (ref 6–23)
CO2: 24 mEq/L (ref 19–32)
Calcium: 9.2 mg/dL (ref 8.4–10.5)
Chloride: 96 mEq/L (ref 96–112)
Creatinine, Ser: 1.22 mg/dL (ref 0.50–1.35)
GFR calc Af Amer: 66 mL/min — ABNORMAL LOW (ref >90)
GFR calc non Af Amer: 57 mL/min — ABNORMAL LOW (ref >90)
Glucose, Bld: 117 mg/dL — ABNORMAL HIGH (ref 70–99)
Potassium: 4.3 mEq/L (ref 3.5–5.1)
Sodium: 132 mEq/L — ABNORMAL LOW (ref 135–145)

## 2013-04-04 LAB — PROTIME-INR
INR: 2.15 — ABNORMAL HIGH (ref 0.00–1.49)
Prothrombin Time: 23.3 seconds — ABNORMAL HIGH (ref 11.6–15.2)

## 2013-04-04 LAB — TROPONIN I
Troponin I: <0.3 ng/mL (ref <0.30)
Troponin I: <0.3 ng/mL (ref <0.30)

## 2013-04-04 LAB — GLUCOSE, CAPILLARY
Glucose-Capillary: 106 mg/dL — ABNORMAL HIGH (ref 70–99)
Glucose-Capillary: 117 mg/dL — ABNORMAL HIGH (ref 70–99)
Glucose-Capillary: 65 mg/dL — ABNORMAL LOW (ref 70–99)

## 2013-04-04 MED ORDER — ONDANSETRON HCL 4 MG/2ML IJ SOLN
INTRAMUSCULAR | Status: AC
  Administered 2013-04-04: 8 mg via INTRAVENOUS
  Filled 2013-04-04: qty 4

## 2013-04-04 MED ORDER — PROMETHAZINE HCL 25 MG/ML IJ SOLN
12.5000 mg | Freq: Four times a day (QID) | INTRAMUSCULAR | Status: DC | PRN
  Administered 2013-04-06 – 2013-04-08 (×3): 12.5 mg via INTRAVENOUS
  Filled 2013-04-04 (×3): qty 1

## 2013-04-04 MED ORDER — FUROSEMIDE 10 MG/ML IJ SOLN: 40.0000 mg | Freq: Once | INTRAMUSCULAR | Status: DC

## 2013-04-04 MED ORDER — TECHNETIUM TC 99M SESTAMIBI GENERIC - CARDIOLITE
10.0000 | Freq: Once | INTRAVENOUS | Status: AC | PRN
  Administered 2013-04-04: 10 via INTRAVENOUS

## 2013-04-04 MED ORDER — REGADENOSON 0.4 MG/5ML IV SOLN
0.4000 mg | Freq: Once | INTRAVENOUS | Status: AC
  Administered 2013-04-04: 0.4 mg via INTRAVENOUS
  Filled 2013-04-04: qty 5

## 2013-04-04 MED ORDER — PRIMIDONE 50 MG PO TABS
50.0000 mg | ORAL_TABLET | Freq: Every morning | ORAL | Status: DC
Start: 2013-04-05 — End: 2013-04-12
  Administered 2013-04-05 – 2013-04-12 (×8): 50 mg via ORAL
  Filled 2013-04-04 (×8): qty 1

## 2013-04-04 MED ORDER — REGADENOSON 0.4 MG/5ML IV SOLN
INTRAVENOUS | Status: AC
  Filled 2013-04-04: qty 5

## 2013-04-04 MED ORDER — NITROGLYCERIN IN D5W 200-5 MCG/ML-% IV SOLN: 5.0000 ug/min | INTRAVENOUS | Status: DC

## 2013-04-04 MED ORDER — TECHNETIUM TC 99M SESTAMIBI GENERIC - CARDIOLITE
30.0000 | Freq: Once | INTRAVENOUS | Status: AC | PRN
  Administered 2013-04-04: 30 via INTRAVENOUS

## 2013-04-04 MED ORDER — PRIMIDONE 50 MG PO TABS
100.0000 mg | ORAL_TABLET | Freq: Every day | ORAL | Status: DC
  Administered 2013-04-04 – 2013-04-11 (×7): 100 mg via ORAL
  Filled 2013-04-04 (×10): qty 2

## 2013-04-04 NOTE — Progress Notes (Signed)
Pt was agitated restless and complaining of nausea. Stated "Something just doesn't feel right". Meds given for nausea and MD on the floor was notified. Later came and assessed. Orders placed. Will continue to monitor.

## 2013-04-04 NOTE — Progress Notes (Signed)
Pt was complaining of nausea and chest pain. Nitro titrated up EKG obtained and MD paged. Pt vomited x2. MD returned page and ordered zofran. Gave medication to pt. Pt stated feeling better. Will continue to monitor

## 2013-04-04 NOTE — Progress Notes (Signed)
[facility]  Subjective: Still has 7/10 substernal chest pain this AM, described as a dull achy pain. Was nauseated last night.   Objective: Vital signs in last 24 hours: Temp:  [97.7 F (36.5 C)-98.6 F (37 C)] 98.6 F (37 C) (09/10 0700) Pulse Rate:  [53-65] 53 (09/10 0420) Resp:  [14-32] 25 (09/10 0420) BP: (86-265)/(39-226) 136/63 mmHg (09/10 0700) SpO2:  [87 %-100 %] 92 % (09/10 0700) FiO2 (%):  [50 %] 50 % (09/10 0420) Weight:  [266 lb 12.1 oz (121 kg)-275 lb 9.2 oz (125 kg)] 275 lb 9.2 oz (125 kg) (09/10 0500) Last BM Date: 04/03/13  Intake/Output from previous day: 09/09 0701 - 09/10 0700 In: 568.4 [P.O.:360; I.V.:208.4] Out: 1200 [Urine:1200] Intake/Output this shift:    Medications Current Facility-Administered Medications  Medication Dose Route Frequency Provider Last Rate Last Dose  . 0.9 %  sodium chloride infusion   Intravenous Continuous Runell Gess, MD      . albuterol (PROVENTIL HFA;VENTOLIN HFA) 108 (90 BASE) MCG/ACT inhaler 1-2 puff  1-2 puff Inhalation Q6H PRN Abelino Derrick, PA-C      . albuterol (PROVENTIL) (5 MG/ML) 0.5% nebulizer solution 2.5 mg  2.5 mg Nebulization Q6H PRN Eda Paschal Kilroy, PA-C      . aspirin EC tablet 81 mg  81 mg Oral Daily Abelino Derrick, PA-C   81 mg at 04/03/13 1829  . atorvastatin (LIPITOR) tablet 40 mg  40 mg Oral q1800 Abelino Derrick, PA-C   40 mg at 04/03/13 1835  . budesonide (PULMICORT) nebulizer solution 0.5 mg  0.5 mg Nebulization BID PRN Eda Paschal Kilroy, PA-C      . citalopram (CELEXA) tablet 40 mg  40 mg Oral Daily Luke K Kilroy, PA-C      . diphenoxylate-atropine (LOMOTIL) 2.5-0.025 MG per tablet 1 tablet  1 tablet Oral QID PRN Abelino Derrick, PA-C      . furosemide (LASIX) tablet 40 mg  40 mg Oral Daily Luke K Kilroy, PA-C      . influenza vac split quadrivalent PF (FLUARIX) injection 0.5 mL  0.5 mL Intramuscular Tomorrow-1000 Runell Gess, MD      . insulin aspart (novoLOG) injection  0-15 Units  0-15 Units Subcutaneous TID WC Luke K Kilroy, PA-C      . insulin aspart (novoLOG) injection 0-5 Units  0-5 Units Subcutaneous QHS Luke K Kilroy, PA-C      . insulin aspart protamine- aspart (NOVOLOG MIX 70/30) injection 30 Units  30 Units Subcutaneous BID WC Abelino Derrick, PA-C   30 Units at 04/03/13 1829  . magnesium oxide (MAG-OX) tablet 200 mg  200 mg Oral Daily Runell Gess, MD      . metoprolol tartrate (LOPRESSOR) tablet 25 mg  25 mg Oral BID Abelino Derrick, PA-C   25 mg at 04/03/13 2202  . nitroGLYCERIN (NITROSTAT) SL tablet 0.4 mg  0.4 mg Sublingual Q5 min PRN Kristen N Ward, DO      . nitroGLYCERIN (NITROSTAT) SL tablet 0.4 mg  0.4 mg Sublingual Q5 min PRN Abelino Derrick, PA-C      . nitroGLYCERIN 0.2 mg/mL in dextrose 5 % infusion  5 mcg/min Intravenous Titrated Kristen N Ward, DO 6 mL/hr at 04/03/13 2330 20 mcg/min at 04/03/13 2330  . ondansetron (ZOFRAN) injection 8 mg  8 mg Intravenous Q8H PRN Mihai Croitoru, MD   8 mg at 04/04/13 0005  . primidone (MYSOLINE) tablet 50-100 mg  50-100 mg Oral BID Abelino Derrick, PA-C   50 mg at 04/03/13 2202  . promethazine (PHENERGAN) injection 25 mg  25 mg Intravenous Q6H PRN Mihai Croitoru, MD   25 mg at 04/04/13 0326  . regadenoson (LEXISCAN) injection SOLN 0.4 mg  0.4 mg Intravenous Once Brittainy Simmons, PA-C      . spironolactone (ALDACTONE) tablet 12.5 mg  12.5 mg Oral BID Eda Paschal Kilroy, PA-C   12.5 mg at 04/03/13 1835  . Warfarin - Pharmacist Dosing Inpatient   Does not apply q1800 Chinita Green[place], [facility]        PE: General appearance: alert, cooperative and no distress Lungs: clear to auscultation bilaterally Heart: irregularly irregular rhythm Extremities: no LEE Pulses: 2+ and symmetric Skin: warm and dry Neurologic: Grossly normal  Lab Results:   Recent Labs  04/03/13 1204 04/04/13 0558  WBC 10.6* 10.4  HGB 13.2 13.5  HCT 39.2 40.2  PLT 198 195   BMET  Recent Labs  04/03/13 1204 04/04/13 0558  NA 134*  132*  K 4.3 4.3  CL 100 96  CO2 21 24  GLUCOSE 109* 117*  BUN 20 23  CREATININE 1.10 1.22  CALCIUM 9.1 9.2   PT/INR  Recent Labs  04/03/13 1737 04/04/13 0558  LABPROT 26.4* 23.3*  INR 2.53* 2.15*   Cardiac Panel (last 3 results)  Recent Labs  04/03/13 1737 04/04/13 0026 04/04/13 0558  TROPONINI <0.30 <0.30 <0.30   BNP (last 3 results)  Recent Labs  04/03/13 1204  PROBNP 2587.0*    Assessment/Plan  Principal Problem:   Unstable angina Active Problems:   HYPERLIPIDEMIA   HYPERTENSION   COPD- chronic O2   Chronic respiratory failure   OSA (obstructive sleep apnea)- on C-pap   CAD, multiple PCIs, last PCI Nov 2012 to his RCA for ISR. 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 Just checked by Dr Royann Shivers   Long term (current) use of anticoagulants- Coumadin   Tremor, essential  Plan: He has ruled out for MI with negative troponins x 3. He continues to have 7/10 chest discomfort with mild radiation to the left arm. Continue on IV NTG. No heparin with current INR. He has been NPO since midnight. Will discuss with MD cath vs NST. If cath is to be considered will need to postpone until INR is <1.7. INR today is 2.15. Atrial fib/?a flutter is rate controlled in the upper 50s. Pt is hyponatremic with Na+ of 132. ? withholding aldactone dose today.     LOS: 1 day    Brittainy M. Sharol Harness, PA-C 04/04/2013 8:00 AM  I have seen and evaluated the patient this AM along with Boyce Medici, PA. I agree with her findings, examination as well as impression recommendations.  Had extensive nausea last PM - was given Phenergan & now is quite sedated.  I cannot make much sense of his symptoms.  He has had 3 different symptoms.  He is profoundly weak & cannot move due to back pain.   His exam is pretty limited due to body habitus, but no rales are noted.  At his point,  with an INR still > 2.0 - & no acute indication with negative biomarkers, I think the best COA is to proceed with Myoview ST today (Lexiscan) for Risk Stratification.  At least some of his symptom description does sound anginal.  --  this would allow [place] to potentially avoid unnecessarily letting his INR drift to subtherapeutic.  BP & HR are stable - paced @ 55 with Afib.  Agree with holding Aldactone today -- hopefully his sodium levels will normalize.  With elevated proBNP - may actually consider low dose Lasix IV x 1. Then convert back to PO.  Glycemic control looks good on current regimen -- he has been NPO, so difficult to determine how he will do once he is eating.  MD Time with pt: 15 min  HARDING,DAVID W, M.D., M.S. [facility] [address] [place], [place]  [address]  [phone] Pager # [phone] 04/04/2013 10:03 AM

## 2013-04-04 NOTE — Progress Notes (Signed)
ANTICOAGULATION CONSULT NOTE - Follow Up Consult  Pharmacy Consult for Heparin Indication: chest pain/ACS  Allergies  Allergen Reactions  . Penicillins Anaphylaxis and Swelling    "swell up all over my body; throat mainly, I think" (07/13/2012)  . Chlorhexidine Gluconate Itching    Chg cloth "don't remember how bad it was" (07/13/2012)    Patient Measurements: Height: 5' 11.5" (181.6 cm) Weight: 275 lb 9.2 oz (125 kg) IBW/kg (Calculated) : 76.45 Heparin Dosing Weight: 103 kg  Vital Signs: Temp: 98.6 F (37 C) (09/10 0700) Temp src: Oral (09/10 0700) BP: 136/63 mmHg (09/10 0700) Pulse Rate: 53 (09/10 0420)  Labs:  Recent Labs  04/03/13 1204 04/03/13 1737 04/04/13 0026 04/04/13 0558  HGB 13.2  --   --  13.5  HCT 39.2  --   --  40.2  PLT 198  --   --  195  LABPROT  --  26.4*  --  23.3*  INR  --  2.53*  --  2.15*  CREATININE 1.10  --   --  1.22  TROPONINI  --  <0.30 <0.30 <0.30    Estimated Creatinine Clearance: 73.1 ml/min (by C-G formula based on Cr of 1.22).   Assessment: CP  Anticoagulation: Afib on Coumadin PTA (last dose 9/8, admit INR    On 10mg  daily except 5mg  Mon / Fri). INR down to 2.15 this am. Enzymes negative x 3. Had N/V and CP overnight  Infectious Disease: WBC 10.4. Afebrile  Cardiovascular: hx HLD / HTN / AFib / ICM (EF >50%) / CAD / +pacemaker / PAD. CAHDS2=3. BP stable but HR 53. Meds: ASa 81mg , Lipitor, Lasix, Magox, metoprolol, spironolactone  Endocrinology: hx DM on SSI and 70/30. CBGs 82-113  Gastrointestinal / Nutrition: hx GERD, morbid obesity  Neurology: primidone  Nephrology: SCr 1.22, CrCL 73. K=4.3  Pulmonary: COPD, hx OSA on CPAP, 92%  Hematology / Oncology: H/H/plts WNL  PTA Medication Issues  Best Practices: heparin gtt  Goal of Therapy:  Heparin level 0.3-0.7 units/ml Monitor platelets by anticoagulation protocol: Yes   Plan:  Lexiscan myoview today Will f/u heparin vs resume Coumadin  Bruce Taylor S. Merilynn Finland,  PharmD, [facility] Clinical Staff Pharmacist Pager [phone]  Misty Stanley Stillinger 04/04/2013,11:10 AM

## 2013-04-04 NOTE — Progress Notes (Signed)
Chaplain received a spiritual consult to talk with patient.  Upon arriving, patient was alert but seemed tired, closing his eyes and yawning from time to time.  Chaplain and patient talked about his previous encounter with a [employer] and how that was uplifting for him, his wife's illness, and his son in [place].  Chaplain provided spiritual support, a pastoral presence, and prayer with the patient.  Patient asked chaplain to return at a later time.  Chaplain will follow up.    04/04/13 1000  Clinical Encounter Type  Visited With Patient  Visit Type Spiritual support  Referral From Nurse  Consult/Referral To Chaplain  Spiritual Encounters  Spiritual Needs Grief support;Prayer  Stress Factors  Patient Stress Factors Exhausted;Family relationships;Health changes;Loss  Family Stress Factors None identified  CH Sheral Apley

## 2013-04-04 NOTE — Progress Notes (Signed)
Nutrition Brief Note  Patient identified on the Malnutrition Screening Tool (MST) Report  Wt Readings from Last 15 Encounters:  04/04/13 275 lb 9.2 oz (125 kg)  02/05/13 274 lb (124.286 kg)  12/14/12 284 lb (128.822 kg)  12/08/12 283 lb 12.8 oz (128.731 kg)  11/27/12 278 lb (126.1 kg)  10/24/12 285 lb (129.275 kg)  07/18/12 297 lb 2.9 oz (134.8 kg)  06/09/12 307 lb 12.8 oz (139.617 kg)  12/08/11 308 lb 6.4 oz (139.889 kg)  07/14/11 315 lb 3.2 oz (142.974 kg)  06/17/11 317 lb 3.9 oz (143.9 kg)  06/17/11 317 lb 3.9 oz (143.9 kg)  03/10/11 311 lb 6.4 oz (141.25 kg)  02/10/11 311 lb (141.069 kg)  10/14/09 311 lb (141.069 kg)    Body mass index is 37.9 kg/(m^2). Patient meets criteria for obesity, class 2 based on current BMI.   Current diet order is CHO Mod Medium, however held for CT. Labs and medications reviewed.   Pt reports wt loss.  Per chart review, pt has lost 32 lbs in the past 1 year (10.4% of his usual wt of 310 lbs, 4% occuring over the last 6 months).  Pt typically lives at home with his wife, but is currently living with daughter. No nutrition interventions warranted at this time. If nutrition, issues arise, please consult RD. Will monitor for adequacy of PO intake.   Loyce Dys, MS RD LDN Clinical Inpatient Dietitian Pager: [phone] Weekend/After hours pager: [phone]

## 2013-04-04 NOTE — Progress Notes (Signed)
Paged fellow on call x3 with no response about pt status. Pt was nauseous and vomiting. Had to page other MD that was on call finally received response from second MD paged. Orders received will continue to monitor.

## 2013-04-05 DIAGNOSIS — I739 Peripheral vascular disease, unspecified: Secondary | ICD-10-CM

## 2013-04-05 DIAGNOSIS — J9610 Chronic respiratory failure, unspecified whether with hypoxia or hypercapnia: Secondary | ICD-10-CM

## 2013-04-05 DIAGNOSIS — G4733 Obstructive sleep apnea (adult) (pediatric): Secondary | ICD-10-CM

## 2013-04-05 LAB — BASIC METABOLIC PANEL
BUN: 25 mg/dL — ABNORMAL HIGH (ref 6–23)
Calcium: 9.2 mg/dL (ref 8.4–10.5)
GFR calc Af Amer: 72 mL/min — ABNORMAL LOW (ref >90)
GFR calc non Af Amer: 62 mL/min — ABNORMAL LOW (ref >90)
Glucose, Bld: 89 mg/dL (ref 70–99)
Potassium: 3.9 mEq/L (ref 3.5–5.1)
Sodium: 134 mEq/L — ABNORMAL LOW (ref 135–145)

## 2013-04-05 LAB — PROTIME-INR
INR: 1.91 — ABNORMAL HIGH (ref 0.00–1.49)
Prothrombin Time: 21.3 seconds — ABNORMAL HIGH (ref 11.6–15.2)

## 2013-04-05 LAB — HEPARIN LEVEL (UNFRACTIONATED): Heparin Unfractionated: 0.31 IU/mL (ref 0.30–0.70)

## 2013-04-05 LAB — CLOSTRIDIUM DIFFICILE BY PCR: Toxigenic C. Difficile by PCR: NEGATIVE

## 2013-04-05 LAB — GLUCOSE, CAPILLARY: Glucose-Capillary: 69 mg/dL — ABNORMAL LOW (ref 70–99)

## 2013-04-05 MED ORDER — WARFARIN SODIUM 10 MG PO TABS
10.0000 mg | ORAL_TABLET | Freq: Once | ORAL | Status: AC
  Administered 2013-04-05: 18:00:00 10 mg via ORAL
  Filled 2013-04-05: qty 1

## 2013-04-05 MED ORDER — WARFARIN SODIUM 2.5 MG PO TABS
12.5000 mg | ORAL_TABLET | Freq: Once | ORAL | Status: DC
  Filled 2013-04-05: qty 1

## 2013-04-05 MED ORDER — LORAZEPAM 0.5 MG PO TABS
0.5000 mg | ORAL_TABLET | Freq: Four times a day (QID) | ORAL | Status: DC | PRN
  Administered 2013-04-05: 0.5 mg via ORAL
  Filled 2013-04-05: qty 1

## 2013-04-05 MED ORDER — HEPARIN (PORCINE) IN NACL 100-0.45 UNIT/ML-% IJ SOLN
1500.0000 [IU]/h | INTRAMUSCULAR | Status: DC
  Administered 2013-04-05 – 2013-04-07 (×4): 1500 [IU]/h via INTRAVENOUS
  Filled 2013-04-05 (×5): qty 250

## 2013-04-05 NOTE — Progress Notes (Signed)
Utilization review completed. Taylen Wendland, RN, BSN. 

## 2013-04-05 NOTE — Progress Notes (Signed)
CBGs on 9/10: 106-99-117-65 mg/dl      2/13: 69 mg/dl Noted that CBGs less than 70 mg/dl.  Had received 70/30 insulin 30 units last night at 1738.  Recommend decreasing 70/30 insulin to 25 units BID if CBGs continue to be less than 70 m/gld.  Patient needs to be eating when taking 70/30 insulin.  Will continue to follow while in hospital.  Smith Mince RN BSN CDE

## 2013-04-05 NOTE — Progress Notes (Signed)
Report called to 4 west at [facility]. [facility] at bedside to transport patient. Pt's family will meet [facility] at wife's bedside and then pt will be transported to pt's room. All belongings with pt. Pt denies chest pain and heparin drip started prior to leaving facility.

## 2013-04-05 NOTE — Progress Notes (Signed)
ANTICOAGULATION CONSULT NOTE - Initial Consult  Pharmacy Consult for Heparin/Coumadin Indication: atrial fibrillation  Allergies  Allergen Reactions  . Penicillins Anaphylaxis and Swelling    "swell up all over my body; throat mainly, I think" (07/13/2012)  . Chlorhexidine Gluconate Itching    Chg cloth "don't remember how bad it was" (07/13/2012)    Patient Measurements: Height: 5' 11.5" (181.6 cm) Weight: 260 lb 2.3 oz (118 kg) IBW/kg (Calculated) : 76.45 Heparin Dosing Weight: 101.2  Vital Signs: Temp: 98.7 F (37.1 C) (09/11 0400) Temp src: Oral (09/11 0400) BP: 135/61 mmHg (09/11 0800) Pulse Rate: 54 (09/11 0300)  Labs:  Recent Labs  04/03/13 1204 04/03/13 1737 04/04/13 0026 04/04/13 0558 04/05/13 0905  HGB 13.2  --   --  13.5  --   HCT 39.2  --   --  40.2  --   PLT 198  --   --  195  --   LABPROT  --  26.4*  --  23.3* 21.3*  INR  --  2.53*  --  2.15* 1.91*  CREATININE 1.10  --   --  1.22 1.14  TROPONINI  --  <0.30 <0.30 <0.30  --     Estimated Creatinine Clearance: 76 ml/min (by C-G formula based on Cr of 1.14).   Medical History: Past Medical History  Diagnosis Date  . Hyperlipidemia   . Hypertension   . 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 Chronic atrial fibrillation   . Ischemic cardiomyopathy     EF 45% by echo 2010  . COPD (chronic obstructive pulmonary disease) 02/04/2010  . On home O2     "24/7"  . Renal insufficiency   . Tremor   . Angina pectoris   . Obesity, morbid 06/15/2011  . Coronary artery disease     Multiple PCIs  . Myocardial infarct ? 1995  . Blood transfusion ?2011    "hemorrhaging out my penis" (07/13/2012)  . Anemia   . Ulcer     "don't know what kind; it was years ago"  . Pacemaker 2010  . CHF (congestive heart failure) 04/29/2009    2D Echo - EF 45-50%, normal  . Shortness of breath     "all the time" (07/13/2012)  . Obstructive sleep apnea on CPAP   . Type II diabetes mellitus   . GERD  (gastroesophageal reflux disease)   . Stomach ulcer 1980's  . Arthritis     "lower back" (07/13/2012)  . Chronic lower back pain   . Hematuria ? 2011  . PAD (peripheral artery disease) 05/09/2012    Lower Arterial Exam - Right and left distal external iliac arteries-equal to or less than 50% diameter reductions, Bilateral SFA-suggestive 50-69% diameter reduction,right posterior tibial artery and left peroneal artery both occluded  . Bruit 07/23/2010    Carotid Duplex - Right and left ICAs demonstrate small amount of fibrous plaque not hemodynamically significant    Assessment: CP  Anticoagulation: Afib on Coumadin PTA (last dose 9/8, admit INR 2.53 on 10mg  daily except 5mg  Mon / Fri). INR down to 1.91this am. Enzymes negative x 3. Spoke with [place] PA this am and ok to resume Coumadin and start heparin until INR>2.  Infectious Disease: WBC 10.4. Afebrile  Cardiovascular: hx HLD / HTN / AFib / ICM (EF >50%) / CAD / +pacemaker / PAD. CAHDS2=3. BP stable but HR 54. Meds: ASa 81mg , Lipitor, Lasix, Magox, metoprolol, spironolactone  Endocrinology: hx DM on SSI and 70/30.  CBGs 65-117  Gastrointestinal / Nutrition: hx GERD, morbid obesity  Neurology: primidone  Nephrology: SCr 1.14, CrCL 76. K=3.9  Pulmonary: COPD, hx OSA on CPAP, 96%  Hematology / Oncology: H/H/plts WNL  PTA Medication Issues  Best Practices: heparin gtt, resume Coumadin  Goal of Therapy:  Heparin level 0.3-0.7 units/ml INR 2-3 Monitor platelets by anticoagulation protocol: Yes   Plan:  Heparin (no bolus) at 1500 units/hr. Heparin level in 6 hrs and daily Coumadin 12.5mg  po x 1 tonight. Daily INR   Teyah Rossy S. Merilynn Finland, PharmD, [facility] Clinical Staff Pharmacist Pager [phone]  Misty Stanley Stillinger 04/05/2013,10:50 AM

## 2013-04-05 NOTE — Progress Notes (Signed)
[facility]  Subjective: Still has a mild dull ache in center of his chest. No other complaints.   Objective: Vital signs in last 24 hours: Temp:  [98.4 F (36.9 C)-98.7 F (37.1 C)] 98.7 F (37.1 C) (09/11 0400) Pulse Rate:  [52-56] 54 (09/11 0300) Resp:  [14-23] 23 (09/11 0800) BP: (85-144)/(49-74) 135/61 mmHg (09/11 0800) SpO2:  [89 %-97 %] 96 % (09/11 0800) Weight:  [260 lb 2.3 oz (118 kg)] 260 lb 2.3 oz (118 kg) (09/11 0500) Last BM Date: 04/04/13  Intake/Output from previous day: 09/10 0701 - 09/11 0700 In: 529 [P.O.:240; I.V.:289] Out: 850 [Urine:850] Intake/Output this shift: Total I/O In: 13 [I.V.:13] Out: -   Medications Current Facility-Administered Medications  Medication Dose Route Frequency Provider Last Rate Last Dose  . 0.9 %  sodium chloride infusion   Intravenous Continuous Runell Gess, MD 10 mL/hr at 04/04/13 1900    . albuterol (PROVENTIL HFA;VENTOLIN HFA) 108 (90 BASE) MCG/ACT inhaler 1-2 puff  1-2 puff Inhalation Q6H PRN Abelino Derrick, PA-C      . albuterol (PROVENTIL) (5 MG/ML) 0.5% nebulizer solution 2.5 mg  2.5 mg Nebulization Q6H PRN Eda Paschal Kilroy, PA-C      . aspirin EC tablet 81 mg  81 mg Oral Daily Luke K Kilroy, PA-C   81 mg at 04/04/13 1357  . atorvastatin (LIPITOR) tablet 40 mg  40 mg Oral q1800 Abelino Derrick, PA-C   40 mg at 04/04/13 1738  . budesonide (PULMICORT) nebulizer solution 0.5 mg  0.5 mg Nebulization BID PRN Eda Paschal Kilroy, PA-C      . citalopram (CELEXA) tablet 40 mg  40 mg Oral Daily Abelino Derrick, PA-C   40 mg at 04/04/13 1356  . diphenoxylate-atropine (LOMOTIL) 2.5-0.025 MG per tablet 1 tablet  1 tablet Oral QID PRN Abelino Derrick, PA-C      . furosemide (LASIX) injection 40 mg  40 mg Intravenous Once Marykay Lex, MD      . furosemide (LASIX) tablet 40 mg  40 mg Oral Daily Abelino Derrick, PA-C   40 mg at 04/04/13 1357  . influenza vac split quadrivalent PF (FLUARIX) injection 0.5 mL  0.5 mL  Intramuscular Tomorrow-1000 Runell Gess, MD      . insulin aspart (novoLOG) injection 0-15 Units  0-15 Units Subcutaneous TID WC Luke K Kilroy, PA-C      . insulin aspart (novoLOG) injection 0-5 Units  0-5 Units Subcutaneous QHS Luke K Kilroy, PA-C      . insulin aspart protamine- aspart (NOVOLOG MIX 70/30) injection 30 Units  30 Units Subcutaneous BID WC Abelino Derrick, PA-C   30 Units at 04/04/13 1738  . magnesium oxide (MAG-OX) tablet 200 mg  200 mg Oral Daily Runell Gess, MD   200 mg at 04/04/13 1356  . metoprolol tartrate (LOPRESSOR) tablet 25 mg  25 mg Oral BID Eda Paschal Kilroy, PA-C   25 mg at 04/04/13 1357  . nitroGLYCERIN (NITROSTAT) SL tablet 0.4 mg  0.4 mg Sublingual Q5 min PRN Kristen N Ward, DO      . nitroGLYCERIN (NITROSTAT) SL tablet 0.4 mg  0.4 mg Sublingual Q5 min PRN Abelino Derrick, PA-C      . nitroGLYCERIN 0.2 mg/mL in dextrose 5 % infusion  5 mcg/min Intravenous Titrated Marykay Lex, MD 3 mL/hr at 04/04/13 1900 10 mcg/min at 04/04/13 1900  . ondansetron (ZOFRAN) injection 8 mg  8 mg  Intravenous Q8H PRN Thurmon Fair, MD   8 mg at 04/04/13 0005  . primidone (MYSOLINE) tablet 100 mg  100 mg Oral QHS Brittainy Simmons, PA-C   100 mg at 04/04/13 2213  . primidone (MYSOLINE) tablet 50 mg  50 mg Oral q morning - 10a Brittainy Simmons, PA-C      . promethazine (PHENERGAN) injection 12.5 mg  12.5 mg Intravenous Q6H PRN Brittainy Simmons, PA-C      . spironolactone (ALDACTONE) tablet 12.5 mg  12.5 mg Oral BID Eda Paschal Kilroy, PA-C   12.5 mg at 04/03/13 1835  . Warfarin - Pharmacist Dosing Inpatient   Does not apply q1800 Chinita Greenland, [facility]        PE: General appearance: alert, cooperative, no distress and moderately obese Lungs: clear to auscultation bilaterally Heart: regular rate and rhythm Extremities: no LEE Pulses: 2+ and symmetric Skin: warm and dry' Neurologic: Grossly normal  Lab Results:   Recent Labs  04/03/13 1204 04/04/13 0558  WBC 10.6* 10.4   HGB 13.2 13.5  HCT 39.2 40.2  PLT 198 195   BMET  Recent Labs  04/03/13 1204 04/04/13 0558  NA 134* 132*  K 4.3 4.3  CL 100 96  CO2 21 24  GLUCOSE 109* 117*  BUN 20 23  CREATININE 1.10 1.22  CALCIUM 9.1 9.2   PT/INR  Recent Labs  04/03/13 1737 04/04/13 0558  LABPROT 26.4* 23.3*  INR 2.53* 2.15*   Cardiac Panel (last 3 results)  Recent Labs  04/03/13 1737 04/04/13 0026 04/04/13 0558  TROPONINI <0.30 <0.30 <0.30    Studies/Results:  NST 04/04/13 FINDINGS:  Myocardial perfusion study:No significant perfusion defect to  suggest scar or inducible ischemia.  Ejection fraction calculation: End-diastolic volume is 104 mL. End  systolic volume is 40 mL. Derived left ventricular ejection fraction  of 62%.  Wall motion analysis: Hypokinetic septal wall. Otherwise normal.  IMPRESSION:  1. Hypokinetic septal wall.  2. No inducible ischemia. Ejection fraction calculated at 62%.    Assessment/Plan  Principal Problem:   Unstable angina Active Problems:   HYPERLIPIDEMIA   HYPERTENSION   COPD- chronic O2   Chronic respiratory failure   OSA (obstructive sleep apnea)- on C-pap   CAD, multiple PCIs, last PCI Nov 2012 to his RCA for ISR. 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 Just checked by Dr Royann Shivers   Long term (current) use of anticoagulants- Coumadin   Tremor, essential  Plan: NST yesterday showed no significant perfusion defect to suggest scar or inducible ischemia. EF was calculated at 62%. BMP pending. Will need to reassess sodium levels. If hyponatremia is resolved, can resume Aldactone. With low risk NST can likely discharge soon. Will likely need SNF.     LOS: 2 days    Brittainy M. Sharol Harness, PA-C 04/05/2013 8:11 AM  I have seen and examined the patient along with Brittainy M. Sharol Harness, PA-C.  I have reviewed the chart, notes and new data.  I  agree with PA's note.  Key new complaints: the chest discomfort is mild and highly atypical ('spider crawling in my chest), bigger complaint appears to be right leg discomfort and tenderness; liquid diarrhea Key examination changes: there is no evidence of pericardial/pleural rub, abnormal lung findings on auscultation or percussion or murmurs. Paradoxically split S2. Excellent right dorsalis pedis pulse/absent posterior tibial pulse and no edema or objective  tenderness in right calf to suggest DVT. No erythema or other signs of cellulitis. Key new findings / data: Na pending (132 yesterday); normal nuclear perfusion scan  PLAN: DC NTG iv Telemetry Check for c.diff Agree that he is severely deconditioned and will need skilled nursing care. Neuropathic leg pain? Known PAD, but no signs of acute limb ischemia   Thurmon Fair, MD, [facility] [phone] 04/05/2013, 8:34 AM

## 2013-04-05 NOTE — Progress Notes (Addendum)
PHARMACY BRIEF NOTE -- Anticoagulation (follow-up heparin level)   Consult for: IV Heparin  Indication: atrial fibrillation/Chest pain (nuclear stress test OK)  Heparin gtt started this morning at [facility] for INR = 1.91 this am, warfarin also resumed tonight.  First heparin level = 0.31(lower end goal). Goal 0.3-0.7  Home coumadin dose: Coumadin 10mg  daily except 5mg  Mon / Fri  Plan:  Continue heparin at current rate of 1500 units/hr as level at lower end goal range Daily heparin level and CBC Coumadin 10mg  given tonight Daily INR  Juliette Alcide, PharmD, BCPS.   Pager: [phone] 04/05/2013 7:51 PM

## 2013-04-05 NOTE — Progress Notes (Signed)
Patient transferred from 2900([facility]). Alert and oriented denies any pain/distress. Oriented patient to room/unit and reviewed plan of care with patient. Will continue to assess patient.

## 2013-04-05 NOTE — Telephone Encounter (Signed)
Pt is hospital; had negative nuclear study

## 2013-04-06 ENCOUNTER — Other Ambulatory Visit: Payer: Self-pay

## 2013-04-06 DIAGNOSIS — E871 Hypo-osmolality and hyponatremia: Secondary | ICD-10-CM

## 2013-04-06 DIAGNOSIS — R197 Diarrhea, unspecified: Secondary | ICD-10-CM

## 2013-04-06 DIAGNOSIS — R5381 Other malaise: Secondary | ICD-10-CM

## 2013-04-06 DIAGNOSIS — R079 Chest pain, unspecified: Secondary | ICD-10-CM | POA: Diagnosis present

## 2013-04-06 LAB — CBC
HCT: 40.6 % (ref 39.0–52.0)
Hemoglobin: 13.2 g/dL (ref 13.0–17.0)
MCHC: 32.5 g/dL (ref 30.0–36.0)
MCV: 87.9 fL (ref 78.0–100.0)
RDW: 15.8 % — ABNORMAL HIGH (ref 11.5–15.5)

## 2013-04-06 LAB — GLUCOSE, CAPILLARY
Glucose-Capillary: 131 mg/dL — ABNORMAL HIGH (ref 70–99)
Glucose-Capillary: 108 mg/dL — ABNORMAL HIGH (ref 70–99)
Glucose-Capillary: 109 mg/dL — ABNORMAL HIGH (ref 70–99)

## 2013-04-06 LAB — BASIC METABOLIC PANEL
BUN: 26 mg/dL — ABNORMAL HIGH (ref 6–23)
CO2: 27 mEq/L (ref 19–32)
Calcium: 9.4 mg/dL (ref 8.4–10.5)
Chloride: 99 mEq/L (ref 96–112)
Creatinine, Ser: 1.23 mg/dL (ref 0.50–1.35)
Glucose, Bld: 84 mg/dL (ref 70–99)

## 2013-04-06 LAB — PROTIME-INR: INR: 1.91 — ABNORMAL HIGH (ref 0.00–1.49)

## 2013-04-06 LAB — HEPARIN LEVEL (UNFRACTIONATED): Heparin Unfractionated: 0.51 IU/mL (ref 0.30–0.70)

## 2013-04-06 MED ORDER — WARFARIN SODIUM 10 MG PO TABS
10.0000 mg | ORAL_TABLET | Freq: Once | ORAL | Status: AC
  Administered 2013-04-06: 21:00:00 10 mg via ORAL
  Filled 2013-04-06 (×2): qty 1

## 2013-04-06 MED ORDER — DEXTROSE 50 % IV SOLN
INTRAVENOUS | Status: AC
  Administered 2013-04-06: 21:00:00 25 mL
  Filled 2013-04-06: qty 50

## 2013-04-06 NOTE — Progress Notes (Signed)
Pt continued to complain of chest pain, repeated SL NTG. Maeola Harman

## 2013-04-06 NOTE — Progress Notes (Signed)
Called to pt room by nurse tech due to pt c/o chest pain.  Pt describes chest pain as a dull ache along with shortness of breath. Pt given SL nitroglycerin, vital signs stable. EKG obtained. Will continue to monitor. Maeola Harman

## 2013-04-06 NOTE — Progress Notes (Signed)
ANTICOAGULATION CONSULT NOTE - Follow Up Consult  Pharmacy Consult for Heparin Indication: chest pain/ACS  Allergies  Allergen Reactions  . Penicillins Anaphylaxis and Swelling    "swell up all over my body; throat mainly, I think" (07/13/2012)  . Chlorhexidine Gluconate Itching    Chg cloth "don't remember how bad it was" (07/13/2012)    Patient Measurements: Height: 5' 11.5" (181.6 cm) Weight: 258 lb 9.6 oz (117.3 kg) IBW/kg (Calculated) : 76.45 Heparin Dosing Weight: 103 kg  Vital Signs: Temp: 98.3 F (36.8 C) (09/12 0505) Temp src: Oral (09/12 0505) BP: 107/66 mmHg (09/12 0601) Pulse Rate: 54 (09/12 0601)  Labs:  Recent Labs  04/03/13 1204  04/03/13 1737 04/04/13 0026 04/04/13 0558 04/05/13 0905 04/05/13 1858 04/06/13 0412  HGB 13.2  --   --   --  13.5  --   --  13.2  HCT 39.2  --   --   --  40.2  --   --  40.6  PLT 198  --   --   --  195  --   --  213  LABPROT  --   < > 26.4*  --  23.3* 21.3*  --  21.3*  INR  --   < > 2.53*  --  2.15* 1.91*  --  1.91*  HEPARINUNFRC  --   --   --   --   --   --  0.31 0.51  CREATININE 1.10  --   --   --  1.22 1.14  --  1.23  TROPONINI  --   --  <0.30 <0.30 <0.30  --   --   --   < > = values in this interval not displayed.  Estimated Creatinine Clearance: 70.2 ml/min (by C-G formula based on Cr of 1.23).   Assessment:  18 YOF presented to [facility] with CP on 9/9 and was started on heparin for unstable angina. Pt also has a h/o Afib on Coumadin PTA (last dose 9/8, admit INR 2.53 on 10mg  daily except 5mg  Mon / Fri). Patient has ruled out for MI and will continue on heparin until INR is back in therapeutic range of 2-3.  INR this AM slightly below therapeutic range at 1.91 after 10mg  x1 last night  Heparin level this AM in therapeutic range at 0.51 on 1500 units/h  CBC is stable and there is no bleeding noted  Goal of Therapy:  INR 2-3 Heparin level 0.3-0.7 units/ml Monitor platelets by anticoagulation protocol: Yes   Plan:   - continue heparin gtt at 1500 units/h - Coumadin 10mg  PO x1 tonight - heparin to continue until INR > 2 - follow-up daily heparin level, INR, and CBC - monitor for bleeding  Thank you for the consult.  Tomi Bamberger, PharmD Clinical Pharmacist Pager: [phone] Pharmacy: [phone] 04/06/2013 10:20 AM

## 2013-04-06 NOTE — Progress Notes (Signed)
Pt c/o worsening chest pain. Pt states pain is sharp, comes on quick, and then goes away. Vital signs WNL. Nitro tab given x2, pt reports relief. Will notify MD.

## 2013-04-06 NOTE — Progress Notes (Signed)
Pt placed on Auto-CPAP via FFM with 3 LPM O2 bleed in, pt tolerating well at this time, RT to monitor and assess as needed.

## 2013-04-06 NOTE — Consult Note (Signed)
[facility] Medical Consultation  Bruce Taylor ZOX:[id number] DOB: 07-06-1941 DOA: 04/03/2013 PCP: Daisy Floro, MD   Requesting physician: Dr. Herbie Baltimore Date of consultation: 04/06/2013 Reason for consultation: diarrhea  Impression/Recommendations Principal Problem:   Chest pain with minimal risk of acute coronary syndrome Active Problems:   HYPERLIPIDEMIA   HYPERTENSION   COPD- chronic O2   Chronic respiratory failure   OSA (obstructive sleep apnea)- on C-pap   CAD, multiple PCIs, last PCI Nov 2012 to his RCA for ISR. 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 Just checked by Dr Royann Shivers   Long term (current) use of anticoagulants- Coumadin   Tremor, essential   Diarrhea - seems that this is somewhat chronic and persistent for the past 2 months.  - will check fecal lactoferrin, TSH, GI pathogen panel, FOBT - recommend GI consultation in the morning given given the fact that has a rectal tube now with apparent significant output, and associated weight loss over the past few months. May benefit from colonoscopy but would defer to GI whether this needs to be done as an outpatient.  - will ask the nurses to record rectal tube output as it is not being charted to determine if rectal tube is needed.   I will followup again tomorrow. Please contact me if I can be of assistance in the meanwhile. Thank you for this consultation.  Chief Complaint: Patient was admitted to the cardiology service with a chief complaint of chest pain  HPI:  72 year old gentleman with coronary artery disease status post multiple stent placement in the past, admitted to [facility] long hospital on 04/03/2013 with a chief complaint of chest pain. Patient underwent a stress test on 9/10 without any significant evidence of perfusion defects suggesting scars or inducible ischemia. [facility] was  consulted by cardiology due to the diarrhea and abdominal pain.   Patient tells me today that he's been having diarrhea for the past 2 months. He describes his diarrhea as watery stools (5-6 times a day). His diarrhea is non bloody and denies dark tarry characteristics. He also describes crampy abdominal pain bilateral lower quadrants on and off. His abdominal pain does not seem to have significant relationship with eating. He also has nausea and vomiting and decreased po intake due to that as well for the past 2 months. He denies any recent antibiotic use. He endorses weight loss and states that he was surprised to see his current weight. Chart review shows that in May 2014 at an office visit he was 283 lbs and now weighs 258 lbs. Last November he was over 300 lbs. He denies sick contacts, any new foods. He denies fever or chills. -  Chart review shows only one medication change in May, his Coreg was switched to Metoprolol.  - Dr. Ewing Schlein is his gastroenterologist and he has regular colonoscopies with him, last one 5 years ago. He denies a diagnosis of diverticulitis.   Review of Systems:  As per HPI.  Past Medical History  Diagnosis Date  . Hyperlipidemia   . Hypertension   . 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 Chronic atrial fibrillation   . Ischemic cardiomyopathy     EF 45% by echo 2010  . COPD (chronic obstructive pulmonary disease) 02/04/2010  . On home O2     "24/7"  . Renal insufficiency   .  Tremor   . Angina pectoris   . Obesity, morbid 06/15/2011  . Coronary artery disease     Multiple PCIs  . Myocardial infarct ? 1995  . Blood transfusion ?2011    "hemorrhaging out my penis" (07/13/2012)  . Anemia   . Ulcer     "don't know what kind; it was years ago"  . Pacemaker 2010  . CHF (congestive heart failure) 04/29/2009    2D Echo - EF 45-50%, normal  . Shortness of breath     "all the time" (07/13/2012)  . Obstructive sleep apnea on CPAP   . Type II diabetes  mellitus   . GERD (gastroesophageal reflux disease)   . Stomach ulcer 1980's  . Arthritis     "lower back" (07/13/2012)  . Chronic lower back pain   . Hematuria ? 2011  . PAD (peripheral artery disease) 05/09/2012    Lower Arterial Exam - Right and left distal external iliac arteries-equal to or less than 50% diameter reductions, Bilateral SFA-suggestive 50-69% diameter reduction,right posterior tibial artery and left peroneal artery both occluded  . Bruit 07/23/2010    Carotid Duplex - Right and left ICAs demonstrate small amount of fibrous plaque not hemodynamically significant   Past Surgical History  Procedure Laterality Date  . Coronary stent placement      x 8  . Back surgery      x3  . Coronary angioplasty    . Coronary stent placement  10/2010    Has stents in LAD, RCA,LCX  . Coronary angioplasty with stent placement      "? total of 9 stents over the years; 6 at one time"  . Tonsillectomy      "I was a little boy" (07/13/2012)  . Appendectomy  1954  . Cataract extraction w/ intraocular lens  implant, bilateral  ~ 2011  . Insert / replace / remove pacemaker  07/2008  . Lumbar laminectomy  1990's    "had an extra lower lumbar; had it taken out" (07/13/2012)  . Lumbar disc surgery  1990's X    "cleaned up mess from 1st then 2nd OR" (07/13/2012)  . Hemorrhoid surgery      "in the dr's office; don't know if they cut them out or banded them"  . Transesophageal echocardiogram  11/13/2004    EF 45-50%, no evidence of intracardiac mass or thrombus, does appear to be a small AST with mild left-to-right and right-to-left shunting  . Cardiac catheterization  06/16/2011    RCA 90% stenosis 3.5x35mm noncompliant Trek balloon was used for high-pressure noncompliant dilation up to approximately 3.51 proximally and 3.4 distally entire segment was reduced to less than 5%  . Cardiac catheterization  10/27/2010    95% stenosis of the distal RCA, 3x83mm bare-metal non-DES Medtronic Integrity  stent to overlap stenosis, post-dilated with a 3.5x21  Sprinter balloon to 3.10mm. As balloon was pulled back, there was progressive increase in dilation to 3.60mm  . Cardiac catheterization  10/26/2010    RCA - discrete 95% proximal in-stent restenosis, mid vessel 80%discrete stenosis;   . Cardiac catheterization  07/30/2008    2x89mm angioscope balloon cuts 3 to 5 atmospheric pressure   Social History:  reports that he quit smoking about 6 years ago. His smoking use included Cigarettes. He has a 104 pack-year smoking history. He has never used smokeless tobacco. He reports that  drinks alcohol. He reports that he does not use illicit drugs.  Allergies  Allergen Reactions  . Penicillins Anaphylaxis  and Swelling    "swell up all over my body; throat mainly, I think" (07/13/2012)  . Chlorhexidine Gluconate Itching    Chg cloth "don't remember how bad it was" (07/13/2012)   Family History  Problem Relation Age of Onset  . Heart disease Father   . Heart disease Brother   . Uterine cancer Mother   . Diabetes Sister     Prior to Admission medications   Medication Sig Start Date End Date Taking? Authorizing Provider  albuterol (PROAIR HFA) 108 (90 BASE) MCG/ACT inhaler Inhale 1-2 puffs into the lungs every 6 (six) hours as needed. For shortness of breath   Yes Historical Provider, MD  aspirin EC 81 MG tablet Take 81 mg by mouth daily.     Yes Historical Provider, MD  citalopram (CELEXA) 40 MG tablet Take 40 mg by mouth daily.   Yes Historical Provider, MD  diphenoxylate-atropine (LOMOTIL) 2.5-0.025 MG per tablet Take 1 tablet by mouth 4 (four) times daily as needed for diarrhea or loose stools.   Yes Historical Provider, MD  furosemide (LASIX) 40 MG tablet Take 40 mg by mouth daily.   Yes Historical Provider, MD  insulin NPH-regular (RELION 70/30) (70-30) 100 UNIT/ML injection Inject 42-46 Units into the skin 2 (two) times daily with a meal.   Yes Historical Provider, MD  isosorbide  mononitrate (IMDUR) 30 MG 24 hr tablet Take 30 mg by mouth daily.     Yes Historical Provider, MD  Magnesium 250 MG TABS Take 250 mg by mouth daily.    Yes Historical Provider, MD  metoprolol tartrate (LOPRESSOR) 25 MG tablet Take 1 tablet (25 mg total) by mouth 2 (two) times daily. 12/14/12  Yes Lennette Bihari, MD  nitroGLYCERIN (NITROSTAT) 0.4 MG SL tablet Place 0.4 mg under the tongue every 5 (five) minutes as needed. For chest pain   Yes Historical Provider, MD  oxybutynin (DITROPAN) 5 MG tablet Take 5 mg by mouth 3 (three) times daily as needed (for urinary incontinence).    Yes Historical Provider, MD  primidone (MYSOLINE) 50 MG tablet Take 1-2 tablets (50-100 mg total) by mouth 2 (two) times daily. 1 tab in the morning and 2 tabs at night 02/05/13  Yes Mihai Croitoru, MD  simvastatin (ZOCOR) 80 MG tablet Take 40 mg by mouth at bedtime.   Yes Historical Provider, MD  spironolactone (ALDACTONE) 25 MG tablet Take 12.5 mg by mouth 2 (two) times daily.   Yes Historical Provider, MD  warfarin (COUMADIN) 10 MG tablet Take 5-10 mg by mouth daily. Takes 10mg  (1 tab) on Tuesday, Wednesday, Thursday, Saturday, Sunday, 5 mg mon, fri   Yes Historical Provider, MD   Physical Exam: Blood pressure 122/51, pulse 107, temperature 97.4 F (36.3 C), temperature source Oral, resp. rate 20, height 5' 11.5" (1.816 m), weight 117.3 kg (258 lb 9.6 oz), SpO2 93.00%. Filed Vitals:   04/06/13 1500  BP: 122/51  Pulse: 107  Temp: 97.4 F (36.3 C)  Resp: 20     General:  NAD, obese caucasian male  Eyes: no scleral icterus  ENT: mmm  Cardiovascular: regular rhythm  Respiratory: no wheezing/crackles  Abdomen: soft, tender to palpation mid-epigastric area, no tenderness in any of the 4 quadrants, no rebound, no guarding. Mild ventral hernia present.  Skin: no rashes  Musculoskeletal: no edema  Labs on Admission:  Basic Metabolic Panel:  Recent Labs Lab 04/03/13 1204 04/04/13 0558 04/05/13 0905  04/06/13 0412  NA 134* 132* 134* 134*  K 4.3  4.3 3.9 3.8  CL 100 96 98 99  CO2 21 24 24 27   GLUCOSE 109* 117* 89 84  BUN 20 23 25* 26*  CREATININE 1.10 1.22 1.14 1.23  CALCIUM 9.1 9.2 9.2 9.4   CBC:  Recent Labs Lab 04/03/13 1204 04/04/13 0558 04/06/13 0412  WBC 10.6* 10.4 11.0*  HGB 13.2 13.5 13.2  HCT 39.2 40.2 40.6  MCV 88.1 87.2 87.9  PLT 198 195 213   Cardiac Enzymes:  Recent Labs Lab 04/03/13 1737 04/04/13 0026 04/04/13 0558  TROPONINI <0.30 <0.30 <0.30   CBG:  Recent Labs Lab 04/05/13 1643 04/05/13 2112 04/06/13 0815 04/06/13 1223 04/06/13 1714  GLUCAP 149* 131* 115* 108* 76   EKG: Independently reviewed. Paced rhythm  Time spent: 50  Pamella Pert [facility] Pager [phone]  If 7PM-7AM, please contact night-coverage [email or web address] Password [facility] 04/06/2013, 8:33 PM

## 2013-04-06 NOTE — Progress Notes (Signed)
Clinical Social Work Department CLINICAL SOCIAL WORK PLACEMENT NOTE 04/06/2013  Patient:  KENTON, FORTIN  Account Number:  [id number] Admit date:  04/03/2013  Clinical Social Worker:  Orpah Greek  Date/time:  04/06/2013 04:13 PM  Clinical Social Work is seeking post-discharge placement for this patient at the following level of care:   SKILLED NURSING   (*CSW will update this form in Epic as items are completed)   04/06/2013  Patient/family provided with [facility] Department of Clinical Social Work's list of facilities offering this level of care within the geographic area requested by the patient (or if unable, by the patient's family).  04/06/2013  Patient/family informed of their freedom to choose among providers that offer the needed level of care, that participate in Medicare, Medicaid or managed care program needed by the patient, have an available bed and are willing to accept the patient.  04/06/2013  Patient/family informed of [facility]' ownership interest in [facility], as well as of the fact that they are under no obligation to receive care at this facility.  PASARR submitted to EDS on 04/06/2013 PASARR number received from EDS on 04/06/2013  FL2 transmitted to all facilities in geographic area requested by pt/family on  04/06/2013 FL2 transmitted to all facilities within larger geographic area on   Patient informed that his/her managed care company has contracts with or will negotiate with  certain facilities, including the following:     Patient/family informed of bed offers received:  04/06/2013 Patient chooses bed at [facility] Physician recommends and patient chooses bed at    Patient to be transferred to [facility] on   Patient to be transferred to facility by   The following physician request were entered in Epic:   Additional Comments:   Unice Bailey, LCSW [facility] Clinical Social Worker cell #: [phone]

## 2013-04-06 NOTE — Progress Notes (Signed)
[name],PA with [facility] notified of events. No orders received. Continue current plan of care. Continue to monitor pt. Bruce Taylor

## 2013-04-06 NOTE — Progress Notes (Signed)
Pt still complaining of mild chest pain. Given 3rd dose of NTG SL.  Notifying MD of events. Will continue to monitor.Bruce Taylor

## 2013-04-06 NOTE — Progress Notes (Signed)
Patient is in hospital for his own medical issues, but visited him for the impending death of his wife, elsewhere in the hospital. He has been married 35 years, and he spoke of her and their relationship freely, but not without tears anytime he did speak of her.  He verbalized that he knows she will die very soon.  He shared his own understanding of his own medical issues, and spoke of his own potential loneliness (though not using the word) and potential move to a facility after the hospital.  Verbalized that he was at a facility previously.  He is also aware that he is likely to die soon and "hinted" that he'd rather to e with his wife.  Discussed the closeness of his wife's death.  Chaplain offered empathy, understanding affirmation and encouragement.  Rema Jasmine, Chaplain Pager: [phone]

## 2013-04-06 NOTE — Progress Notes (Signed)
Clinical Social Work Department BRIEF PSYCHOSOCIAL ASSESSMENT 04/06/2013  Patient:  IVIE, MAESE     Account Number:  [id number]     Admit date:  04/03/2013  Clinical Social Worker:  Orpah Greek  Date/Time:  04/06/2013 04:06 PM  Referred by:  Physician  Date Referred:  04/06/2013 Referred for  SNF Placement   Other Referral:   Interview type:  Patient Other interview type:   and sister, Beryle Flock    PSYCHOSOCIAL DATA Living Status:  WIFE Admitted from facility:   Level of care:   Primary support name:  Davan Nawabi III (son) c#: [phone] Primary support relationship to patient:  CHILD, ADULT Degree of support available:   good    CURRENT CONCERNS Current Concerns  Post-Acute Placement   Other Concerns:    SOCIAL WORK ASSESSMENT / PLAN CSW received consult from MD that patient would need SNF placement @ discharge.   Assessment/plan status:  Information/Referral to [facility] Other assessment/ plan:   Patient's wife, [name] was a patient in 50 (admitted 9/7) expired last night, patient moved from [facility] to [place] to be able to be with her during her final hours. Spiritual Care has been visiting with patient. When CSW met at bedside, patient was laying with his eyes closed. Per his sister, patient has been taking her passing hard.   Information/referral to community resources:   CSW completed FL2 and faxed information out to [employer] SNFs - provided bed offers to patient & sister.    PATIENT'S/FAMILY'S RESPONSE TO PLAN OF CARE: Patient's sister states that he's been to [facility] in the past and would prefer to go there at discharge. CSW confirmed with Tresa Endo @ [facility] that they would be able to take patient when stable.       Unice Bailey, LCSW [facility] Clinical Social Worker cell #: [phone]

## 2013-04-06 NOTE — Progress Notes (Signed)
Follow up this morning since patient's wife died on 3E last night. Patient's sister and niece were also in room. Offered support and condolences. I have a picture from the last time the spouse and patient were together that the family gave me permission to take. I showed this to family and sent them a copy per their request. Presence; grief support.

## 2013-04-06 NOTE — Progress Notes (Addendum)
Patient ID: Bruce Taylor, male   DOB: 26-Oct-1940, 72 y.o.   MRN: [id number]  [facility]  Subjective: Still has a mild dull ache in center of his chest. No other complaints.   Objective: Vital signs in last 24 hours: Temp:  [97.4 F (36.3 C)-98.7 F (37.1 C)] 97.4 F (36.3 C) (09/12 1500) Pulse Rate:  [54-107] 107 (09/12 1500) Resp:  [20-22] 20 (09/12 1500) BP: (107-151)/(51-77) 122/51 mmHg (09/12 1500) SpO2:  [93 %-96 %] 93 % (09/12 1500) Weight:  [258 lb 9.6 oz (117.3 kg)] 258 lb 9.6 oz (117.3 kg) (09/12 0505) Last BM Date: 04/05/13  Intake/Output from previous day: 09/11 0701 - 09/12 0700 In: 599.5 [P.O.:360; I.V.:214.5] Out: 1150 [Urine:1150] Intake/Output this shift: Total I/O In: 240 [P.O.:240] Out: -   Medications Current Facility-Administered Medications  Medication Dose Route Frequency Provider Last Rate Last Dose  . 0.9 %  sodium chloride infusion   Intravenous Continuous Runell Gess, MD 10 mL/hr at 04/04/13 1900    . albuterol (PROVENTIL HFA;VENTOLIN HFA) 108 (90 BASE) MCG/ACT inhaler 1-2 puff  1-2 puff Inhalation Q6H PRN Abelino Derrick, PA-C      . albuterol (PROVENTIL) (5 MG/ML) 0.5% nebulizer solution 2.5 mg  2.5 mg Nebulization Q6H PRN Abelino Derrick, PA-C      . aspirin EC tablet 81 mg  81 mg Oral Daily Abelino Derrick, PA-C   81 mg at 04/06/13 1209  . atorvastatin (LIPITOR) tablet 40 mg  40 mg Oral q1800 Abelino Derrick, PA-C   40 mg at 04/05/13 1817  . budesonide (PULMICORT) nebulizer solution 0.5 mg  0.5 mg Nebulization BID PRN Eda Paschal Kilroy, PA-C      . citalopram (CELEXA) tablet 40 mg  40 mg Oral Daily Abelino Derrick, PA-C   40 mg at 04/06/13 1209  . diphenoxylate-atropine (LOMOTIL) 2.5-0.025 MG per tablet 1 tablet  1 tablet Oral QID PRN Abelino Derrick, PA-C      . furosemide (LASIX) injection 40 mg  40 mg Intravenous Once Marykay Lex, MD      . furosemide (LASIX) tablet 40 mg  40 mg Oral Daily Abelino Derrick, PA-C   40  mg at 04/06/13 1210  . heparin ADULT infusion 100 units/mL (25000 units/250 mL)  1,500 Units/hr Intravenous Continuous [place], RPH 15 mL/hr at 04/06/13 0357 1,500 Units/hr at 04/06/13 0357  . insulin aspart (novoLOG) injection 0-15 Units  0-15 Units Subcutaneous TID WC Abelino Derrick, PA-C   2 Units at 04/05/13 1818  . insulin aspart (novoLOG) injection 0-5 Units  0-5 Units Subcutaneous QHS Luke K Kilroy, PA-C      . insulin aspart protamine- aspart (NOVOLOG MIX 70/30) injection 30 Units  30 Units Subcutaneous BID WC Abelino Derrick, PA-C   30 Units at 04/06/13 0835  . magnesium oxide (MAG-OX) tablet 200 mg  200 mg Oral Daily Runell Gess, MD   200 mg at 04/06/13 1209  . metoprolol tartrate (LOPRESSOR) tablet 25 mg  25 mg Oral BID Abelino Derrick, PA-C   25 mg at 04/06/13 1209  . nitroGLYCERIN (NITROSTAT) SL tablet 0.4 mg  0.4 mg Sublingual Q5 min PRN Abelino Derrick, PA-C   0.4 mg at 04/06/13 1222  . ondansetron (ZOFRAN) injection 8 mg  8 mg Intravenous Q8H PRN Mihai Croitoru, MD   8 mg at 04/06/13 1235  . primidone (MYSOLINE) tablet 100 mg  100 mg Oral  QHS Brittainy Simmons, PA-C   100 mg at 04/05/13 2115  . primidone (MYSOLINE) tablet 50 mg  50 mg Oral q morning - 10a Brittainy Simmons, PA-C   50 mg at 04/06/13 1209  . promethazine (PHENERGAN) injection 12.5 mg  12.5 mg Intravenous Q6H PRN Brittainy Simmons, PA-C   12.5 mg at 04/06/13 1610  . spironolactone (ALDACTONE) tablet 12.5 mg  12.5 mg Oral BID Eda Paschal Kilroy, PA-C   12.5 mg at 04/06/13 1210  . warfarin (COUMADIN) tablet 10 mg  10 mg Oral ONCE-1800 Laurence Slate, [facility]      . Warfarin - Pharmacist Dosing Inpatient   Does not apply q1800 Chinita Greenland, [facility]        PE: General appearance: alert, cooperative, no distress and moderately obese Lungs: clear to auscultation bilaterally Heart: regular rate and rhythm Extremities: no LEE Pulses: 2+ and symmetric Skin: warm and dry' Neurologic: Grossly normal  Lab  Results:   Recent Labs  04/04/13 0558 04/06/13 0412  WBC 10.4 11.0*  HGB 13.5 13.2  HCT 40.2 40.6  PLT 195 213   BMET  Recent Labs  04/04/13 0558 04/05/13 0905 04/06/13 0412  NA 132* 134* 134*  K 4.3 3.9 3.8  CL 96 98 99  CO2 24 24 27   GLUCOSE 117* 89 84  BUN 23 25* 26*  CREATININE 1.22 1.14 1.23  CALCIUM 9.2 9.2 9.4   PT/INR  Recent Labs  04/04/13 0558 04/05/13 0905 04/06/13 0412  LABPROT 23.3* 21.3* 21.3*  INR 2.15* 1.91* 1.91*   Cardiac Panel (last 3 results)  Recent Labs  04/04/13 0026 04/04/13 0558  TROPONINI <0.30 <0.30    Studies/Results:  NST 04/04/13 FINDINGS:  Myocardial perfusion study:No significant perfusion defect to  suggest scar or inducible ischemia.  Ejection fraction calculation: End-diastolic volume is 104 mL. End  systolic volume is 40 mL. Derived left ventricular ejection fraction  of 62%.  Wall motion analysis: Hypokinetic septal wall. Otherwise normal.  IMPRESSION:  1. Hypokinetic septal wall.  2. No inducible ischemia. Ejection fraction calculated at 62%.   Assessment/Plan  Principal Problem:   Chest pain with minimal risk of acute coronary syndrome Active Problems:   HYPERLIPIDEMIA   HYPERTENSION   COPD- chronic O2   Chronic respiratory failure   OSA (obstructive sleep apnea)- on C-pap   CAD, multiple PCIs, last PCI Nov 2012 to his RCA for ISR. 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 Just checked by Dr Royann Shivers   Long term (current) use of anticoagulants- Coumadin   Tremor, essential  NST 9/10 showed no significant perfusion defect to suggest scar or inducible ischemia. EF was calculated at 62%. BMP with Na 134.   Relatively stable today, Na back to 134, stable.  Hold off on increasing Aldactone for now to allow for stabilization.  Keep current PO lasix dosing.  Very atypical chest discomfort with negative  myoview & no evidence to suggest pericarditis.    He seems much clearer mentally now.  BP is stable.  Has had some HR > 100,but stable.  With PM in place, if HR stays high, may increase BB dose.  Stable glycemic control. C-diff negative. INR stable, but subtherapeutic today 1.9.  Back on warfarin as no plan for invasive procedures.  Unfortunately, he has been complaining of GI - Abdominal pain.  Had rectal tube placed yesterday -- C-diff  negative --> will ask TRH to consult for a different perspective re? Abdominal Xray vs. Other imaging.  ? Any other studyies.     Due to deconditioning - SW has been working on SNF  Placement & appears to have a bed available.  I anticipate that he may be ready to d/c to SNF as early as tomorrow AM form a cardiology standpoint.  Will await opinion of TRH.  Marykay Lex, MD

## 2013-04-07 DIAGNOSIS — K5289 Other specified noninfective gastroenteritis and colitis: Secondary | ICD-10-CM

## 2013-04-07 LAB — BASIC METABOLIC PANEL
BUN: 19 mg/dL (ref 6–23)
CO2: 24 mEq/L (ref 19–32)
Calcium: 9.3 mg/dL (ref 8.4–10.5)
Chloride: 98 mEq/L (ref 96–112)
Creatinine, Ser: 1.17 mg/dL (ref 0.50–1.35)
GFR calc Af Amer: 70 mL/min — ABNORMAL LOW (ref >90)

## 2013-04-07 LAB — GLUCOSE, CAPILLARY
Glucose-Capillary: 90 mg/dL (ref 70–99)
Glucose-Capillary: 67 mg/dL — ABNORMAL LOW (ref 70–99)
Glucose-Capillary: 74 mg/dL (ref 70–99)
Glucose-Capillary: 54 mg/dL — ABNORMAL LOW (ref 70–99)
Glucose-Capillary: 69 mg/dL — ABNORMAL LOW (ref 70–99)

## 2013-04-07 LAB — CBC
HCT: 40.4 % (ref 39.0–52.0)
Hemoglobin: 13.2 g/dL (ref 13.0–17.0)
MCH: 28.8 pg (ref 26.0–34.0)
MCHC: 32.7 g/dL (ref 30.0–36.0)
RDW: 15.8 % — ABNORMAL HIGH (ref 11.5–15.5)

## 2013-04-07 LAB — FECAL LACTOFERRIN, QUANT

## 2013-04-07 LAB — TSH: TSH: 1.885 u[IU]/mL (ref 0.350–4.500)

## 2013-04-07 MED ORDER — WARFARIN SODIUM 10 MG PO TABS
10.0000 mg | ORAL_TABLET | Freq: Once | ORAL | Status: AC
  Administered 2013-04-07: 20:00:00 10 mg via ORAL
  Filled 2013-04-07: qty 1

## 2013-04-07 MED ORDER — ACETAMINOPHEN 325 MG PO TABS
650.0000 mg | ORAL_TABLET | ORAL | Status: DC | PRN
  Filled 2013-04-07: qty 2

## 2013-04-07 MED ORDER — FUROSEMIDE 40 MG PO TABS
40.0000 mg | ORAL_TABLET | Freq: Every day | ORAL | Status: DC
  Administered 2013-04-09 – 2013-04-12 (×3): 40 mg via ORAL
  Filled 2013-04-07 (×4): qty 1

## 2013-04-07 MED ORDER — INSULIN ASPART PROT & ASPART (70-30 MIX) 100 UNIT/ML ~~LOC~~ SUSP
15.0000 [IU] | Freq: Two times a day (BID) | SUBCUTANEOUS | Status: DC
  Administered 2013-04-07 – 2013-04-08 (×3): 15 [IU] via SUBCUTANEOUS
  Filled 2013-04-07: qty 10

## 2013-04-07 NOTE — Progress Notes (Signed)
Hypoglycemic Event  CBG: 54  Treatment: 15 GM carbohydrate snack  Symptoms: Pale and Nervous/irritable  Follow-up CBG: Time:2255  CBG Result:69 Possible Reasons for Event: Inadequate meal intake  Comments/MD notified:yes   Alic Hilburn, Bari Mantis  Remember to initiate Hypoglycemia Order Set & complete

## 2013-04-07 NOTE — Progress Notes (Signed)
[facility] PROGRESS NOTE/CONSULT Note  Bruce Taylor HQI:[id number] DOB: Oct 07, 1940 DOA: 04/03/2013 PCP: Daisy Floro, MD  Assessment/Plan: #1 diarrhea Ongoing greater than 2 months likely chronic in nature. C. difficile PCR is negative. Stool studies are pending. Patient has been seen by gastroenterology and following. Per GI.  #2 chest pain Per primary team.  #3 hypertension Continue Lasix, Lopressor, spironolactone.  #4. diabetes mellitus Hemoglobin A1c is 8.3. CBGs have ranged from 78-109. Continue 7030. Sliding scale insulin.  #5 COPD/chronic respiratory failure on chronic O2 Stable.  #6 obstructive sleep apnea CPAP. And  Code Status: Full Family Communication: Updated patient's son at bedside Disposition Plan: Per primary service   Consultants:  Gastroenterology: Dr. Randa Evens 04/07/2013  Procedures:  CT head without contrast 04/04/2013  Chest x-ray 04/03/2013  Antibiotics:  None  HPI/Subjective: Patient with loose stool in rectal pouch,  Objective: Filed Vitals:   04/07/13 1529  BP: 129/59  Pulse: 55  Temp: 98.6 F (37 C)  Resp: 18    Intake/Output Summary (Last 24 hours) at 04/07/13 1623 Last data filed at 04/07/13 1500  Gross per 24 hour  Intake    910 ml  Output    850 ml  Net     60 ml   Filed Weights   04/05/13 0500 04/06/13 0505 04/07/13 0500  Weight: 118 kg (260 lb 2.3 oz) 117.3 kg (258 lb 9.6 oz) 119.4 kg (263 lb 3.7 oz)    Exam:   General:  NAD  Cardiovascular: RRR  Respiratory: CTAB  Abdomen: Soft/NT/ND/+BS  Data Reviewed: Basic Metabolic Panel:  Recent Labs Lab 04/03/13 1204 04/04/13 0558 04/05/13 0905 04/06/13 0412 04/07/13 0515  NA 134* 132* 134* 134* 132*  K 4.3 4.3 3.9 3.8 3.9  CL 100 96 98 99 98  CO2 21 24 24 27 24   GLUCOSE 109* 117* 89 84 76  BUN 20 23 25* 26* 19  CREATININE 1.10 1.22 1.14 1.23 1.17  CALCIUM 9.1 9.2 9.2 9.4 9.3  MG  --   --   --   --  2.2   Liver Function  Tests: No results found for this basename: AST, ALT, ALKPHOS, BILITOT, PROT, ALBUMIN,  in the last 168 hours No results found for this basename: LIPASE, AMYLASE,  in the last 168 hours No results found for this basename: AMMONIA,  in the last 168 hours CBC:  Recent Labs Lab 04/03/13 1204 04/04/13 0558 04/06/13 0412 04/07/13 0515  WBC 10.6* 10.4 11.0* 9.4  HGB 13.2 13.5 13.2 13.2  HCT 39.2 40.2 40.6 40.4  MCV 88.1 87.2 87.9 88.0  PLT 198 195 213 205   Cardiac Enzymes:  Recent Labs Lab 04/03/13 1737 04/04/13 0026 04/04/13 0558  TROPONINI <0.30 <0.30 <0.30   BNP (last 3 results)  Recent Labs  04/03/13 1204  PROBNP 2587.0*   CBG:  Recent Labs Lab 04/06/13 1714 04/06/13 2108 04/06/13 2143 04/07/13 0740 04/07/13 1218  GLUCAP 76 55* 109* 78 90    Recent Results (from the past 240 hour(s))  MRSA PCR SCREENING     Status: None   Collection Time    04/03/13  5:11 PM      Result Value Range Status   MRSA by PCR NEGATIVE  NEGATIVE Final   Comment:            The GeneXpert MRSA Assay (FDA     approved for NASAL specimens     only), is one component of a     comprehensive MRSA colonization  surveillance program. It is not     intended to diagnose MRSA     infection nor to guide or     monitor treatment for     MRSA infections.  CLOSTRIDIUM DIFFICILE BY PCR     Status: None   Collection Time    04/05/13 10:09 AM      Result Value Range Status   C difficile by pcr NEGATIVE  NEGATIVE Final     Studies: No results found.  Scheduled Meds: . aspirin EC  81 mg Oral Daily  . atorvastatin  40 mg Oral q1800  . citalopram  40 mg Oral Daily  . furosemide  40 mg Intravenous Once  . [START ON 04/09/2013] furosemide  40 mg Oral Daily  . insulin aspart  0-15 Units Subcutaneous TID WC  . insulin aspart  0-5 Units Subcutaneous QHS  . insulin aspart protamine- aspart  30 Units Subcutaneous BID WC  . magnesium oxide  200 mg Oral Daily  . metoprolol tartrate  25 mg  Oral BID  . primidone  100 mg Oral QHS  . primidone  50 mg Oral q morning - 10a  . spironolactone  12.5 mg Oral BID  . warfarin  10 mg Oral ONCE-1800  . Warfarin - Pharmacist Dosing Inpatient   Does not apply q1800   Continuous Infusions: . sodium chloride 10 mL/hr at 04/04/13 1900  . heparin 1,500 Units/hr (04/07/13 1443)    Principal Problem:   Chest pain with minimal risk of acute coronary syndrome Active Problems:   HYPERLIPIDEMIA   HYPERTENSION   COPD- chronic O2   Chronic respiratory failure   OSA (obstructive sleep apnea)- on C-pap   CAD, multiple PCIs, last PCI Nov 2012 to his RCA for ISR. 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 Just checked by Dr Royann Shivers   Long term (current) use of anticoagulants- Coumadin   Tremor, essential    Time spent: > 35 mins    [facility]  [facility] Pager [phone]. If 7PM-7AM, please contact night-coverage at [email or web address], password [facility] 04/07/2013, 4:23 PM  LOS: 4 days

## 2013-04-07 NOTE — Progress Notes (Signed)
Hypoglycemic Event  CBG: 67  Treatment: 15 GM carbohydrate snack  Symptoms: skin cool  Follow-up CBG: Time:1728 CBG Result:74  Possible Reasons for Event: Inadequate meal intake  Comments/MD notified: MD notified    Curley Spice  Remember to initiate Hypoglycemia Order Set & complete

## 2013-04-07 NOTE — Progress Notes (Signed)
Hypoglycemic Event  CBG: 55  Treatment: D50 IV 25 mL  Symptoms: Shaky  Follow-up CBG: Time:2140 CBG Result:109  Possible Reasons for Event: Unknown  Comments/MD notified:M. Burnadette Peter, NP    Newman Nip Sheree  Remember to initiate Hypoglycemia Order Set & complete

## 2013-04-07 NOTE — Progress Notes (Signed)
ANTICOAGULATION CONSULT NOTE - Follow Up Consult  Pharmacy Consult for Heparin Indication: chest pain/ACS  Allergies  Allergen Reactions  . Penicillins Anaphylaxis and Swelling    "swell up all over my body; throat mainly, I think" (07/13/2012)  . Chlorhexidine Gluconate Itching    Chg cloth "don't remember how bad it was" (07/13/2012)    Patient Measurements: Height: 5' 11.5" (181.6 cm) Weight: 263 lb 3.7 oz (119.4 kg) IBW/kg (Calculated) : 76.45 Heparin Dosing Weight: 103 kg  Vital Signs: Temp: 97.6 F (36.4 C) (09/13 0448) Temp src: Oral (09/13 0448) BP: 123/58 mmHg (09/13 0448) Pulse Rate: 53 (09/13 0448)  Labs:  Recent Labs  04/05/13 0905 04/05/13 1858 04/06/13 0412 04/07/13 0515  HGB  --   --  13.2 13.2  HCT  --   --  40.6 40.4  PLT  --   --  213 205  LABPROT 21.3*  --  21.3* 20.5*  INR 1.91*  --  1.91* 1.82*  HEPARINUNFRC  --  0.31 0.51 0.38  CREATININE 1.14  --  1.23 1.17    Estimated Creatinine Clearance: 74.5 ml/min (by C-G formula based on Cr of 1.17).   Assessment:  32 YOF presented to [facility] with CP on 9/9 and was started on heparin for unstable angina. Pt also has a h/o Afib on Coumadin PTA (last dose 9/8, admit INR 2.53 on 10mg  daily except 5mg  Mon / Fri). Patient has ruled out for MI and will continue on heparin until INR is back in therapeutic range of 2-3.  INR this AM slightly below therapeutic range at 1.82 and below previous level after 10mg  x2   Heparin level this AM in therapeutic range at 0.38 on 1500 units/h  CBC is stable and there is no bleeding noted  Goal of Therapy:  INR 2-3 Heparin level 0.3-0.7 units/ml Monitor platelets by anticoagulation protocol: Yes   Plan:  - continue heparin gtt at 1500 units/h - Coumadin 10mg  PO x1 tonight - heparin to continue until INR > 2 - follow-up daily heparin level, INR, and CBC - monitor for bleeding  Thank you for the consult.  Tomi Bamberger, PharmD Clinical Pharmacist Pager:  [phone] Pharmacy: [phone] 04/07/2013 7:16 AM

## 2013-04-07 NOTE — Consult Note (Addendum)
[facility] CONSULT Reason for consult: Diarrhea Referring Physician: Dr. Nanetta Batty. PCP: Dr. Tenny Craw. Primary G.I.: Dr. Sharol Harness [name] is an 72 y.o. male.  HPI:  He was admitted about 3 to 4 days ago with chest pain. He has known coronary artery disease is had previous stents placed. He has a pacemaker, April 5 and has been on chronic Coumadin therapy. He also has COPD is on chronic oxygen as well as poor conditioning and morbid obesity. Since this admission to the hospital here 4 days ago his wife dieed after a long battle with breast cancer. This has had him somewhat stressed-out. He's been interviewed today with his sister and son who give a lot of history. The patient has a history of colon polyps and does undergo routine surveillance colonoscopy is by Dr. Ewing Schlein. He does not remember when this last colonoscopy was but it has been within the past several years and he states it was negative. For the past 2 to 3 months he has had been having problems with intermittent diarrhea multiple watery stools and episodes of the conference. During this time he has also been diagnosed with Parkinson's disease. He reports that he had urinary incontinence, hematuria and infection of his urine about 2 months ago and that he was treated with antibiotics for this. He is not seen anybody do stool. The patient's work up here in the hospital apparently has been negative for acute coronary event. He still is having loose stools. C. diff toxin, lactoferrin and, G.I. pathogen panel have all been ordered and are still pending at the time of this dictation. The patient reports that he has a minimal abdominal pain.  Past Medical History  Diagnosis Date  . Hyperlipidemia   . Hypertension   . 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 Chronic atrial fibrillation   . Ischemic cardiomyopathy     EF 45% by echo 2010  . COPD (chronic obstructive pulmonary disease) 02/04/2010  . On home O2     "24/7"   . Renal insufficiency   . Tremor   . Angina pectoris   . Obesity, morbid 06/15/2011  . Coronary artery disease     Multiple PCIs  . Myocardial infarct ? 1995  . Blood transfusion ?2011    "hemorrhaging out my penis" (07/13/2012)  . Anemia   . Ulcer     "don't know what kind; it was years ago"  . Pacemaker 2010  . CHF (congestive heart failure) 04/29/2009    2D Echo - EF 45-50%, normal  . Shortness of breath     "all the time" (07/13/2012)  . Obstructive sleep apnea on CPAP   . Type II diabetes mellitus   . GERD (gastroesophageal reflux disease)   . Stomach ulcer 1980's  . Arthritis     "lower back" (07/13/2012)  . Chronic lower back pain   . Hematuria ? 2011  . PAD (peripheral artery disease) 05/09/2012    Lower Arterial Exam - Right and left distal external iliac arteries-equal to or less than 50% diameter reductions, Bilateral SFA-suggestive 50-69% diameter reduction,right posterior tibial artery and left peroneal artery both occluded  . Bruit 07/23/2010    Carotid Duplex - Right and left ICAs demonstrate small amount of fibrous plaque not hemodynamically significant    Past Surgical History  Procedure Laterality Date  . Coronary stent placement      x 8  . Back surgery      x3  .  Coronary angioplasty    . Coronary stent placement  10/2010    Has stents in LAD, RCA,LCX  . Coronary angioplasty with stent placement      "? total of 9 stents over the years; 6 at one time"  . Tonsillectomy      "I was a little boy" (07/13/2012)  . Appendectomy  1954  . Cataract extraction w/ intraocular lens  implant, bilateral  ~ 2011  . Insert / replace / remove pacemaker  07/2008  . Lumbar laminectomy  1990's    "had an extra lower lumbar; had it taken out" (07/13/2012)  . Lumbar disc surgery  1990's X    "cleaned up mess from 1st then 2nd OR" (07/13/2012)  . Hemorrhoid surgery      "in the dr's office; don't know if they cut them out or banded them"  . Transesophageal  echocardiogram  11/13/2004    EF 45-50%, no evidence of intracardiac mass or thrombus, does appear to be a small AST with mild left-to-right and right-to-left shunting  . Cardiac catheterization  06/16/2011    RCA 90% stenosis 3.5x80mm noncompliant Trek balloon was used for high-pressure noncompliant dilation up to approximately 3.51 proximally and 3.4 distally entire segment was reduced to less than 5%  . Cardiac catheterization  10/27/2010    95% stenosis of the distal RCA, 3x86mm bare-metal non-DES Medtronic Integrity stent to overlap stenosis, post-dilated with a 3.5x21 Tyrone Sprinter balloon to 3.50mm. As balloon was pulled back, there was progressive increase in dilation to 3.51mm  . Cardiac catheterization  10/26/2010    RCA - discrete 95% proximal in-stent restenosis, mid vessel 80%discrete stenosis;   . Cardiac catheterization  07/30/2008    2x11mm angioscope balloon cuts 3 to 5 atmospheric pressure    Family History  Problem Relation Age of Onset  . Heart disease Father   . Heart disease Brother   . Uterine cancer Mother   . Diabetes Sister     Social History:  reports that he quit smoking about 6 years ago. His smoking use included Cigarettes. He has a 104 pack-year smoking history. He has never used smokeless tobacco. He reports that  drinks alcohol. He reports that he does not use illicit drugs.  Allergies:  Allergies  Allergen Reactions  . Penicillins Anaphylaxis and Swelling    "swell up all over my body; throat mainly, I think" (07/13/2012)  . Chlorhexidine Gluconate Itching    Chg cloth "don't remember how bad it was" (07/13/2012)    Medications; Prior to Admission medications   Medication Sig Start Date End Date Taking? Authorizing Provider  albuterol (PROAIR HFA) 108 (90 BASE) MCG/ACT inhaler Inhale 1-2 puffs into the lungs every 6 (six) hours as needed. For shortness of breath   Yes Historical Provider, MD  aspirin EC 81 MG tablet Take 81 mg by mouth daily.     Yes  Historical Provider, MD  citalopram (CELEXA) 40 MG tablet Take 40 mg by mouth daily.   Yes Historical Provider, MD  diphenoxylate-atropine (LOMOTIL) 2.5-0.025 MG per tablet Take 1 tablet by mouth 4 (four) times daily as needed for diarrhea or loose stools.   Yes Historical Provider, MD  furosemide (LASIX) 40 MG tablet Take 40 mg by mouth daily.   Yes Historical Provider, MD  insulin NPH-regular (RELION 70/30) (70-30) 100 UNIT/ML injection Inject 42-46 Units into the skin 2 (two) times daily with a meal.   Yes Historical Provider, MD  isosorbide mononitrate (IMDUR) 30 MG 24 hr  tablet Take 30 mg by mouth daily.     Yes Historical Provider, MD  Magnesium 250 MG TABS Take 250 mg by mouth daily.    Yes Historical Provider, MD  metoprolol tartrate (LOPRESSOR) 25 MG tablet Take 1 tablet (25 mg total) by mouth 2 (two) times daily. 12/14/12  Yes Lennette Bihari, MD  nitroGLYCERIN (NITROSTAT) 0.4 MG SL tablet Place 0.4 mg under the tongue every 5 (five) minutes as needed. For chest pain   Yes Historical Provider, MD  oxybutynin (DITROPAN) 5 MG tablet Take 5 mg by mouth 3 (three) times daily as needed (for urinary incontinence).    Yes Historical Provider, MD  primidone (MYSOLINE) 50 MG tablet Take 1-2 tablets (50-100 mg total) by mouth 2 (two) times daily. 1 tab in the morning and 2 tabs at night 02/05/13  Yes Mihai Croitoru, MD  simvastatin (ZOCOR) 80 MG tablet Take 40 mg by mouth at bedtime.   Yes Historical Provider, MD  spironolactone (ALDACTONE) 25 MG tablet Take 12.5 mg by mouth 2 (two) times daily.   Yes Historical Provider, MD  warfarin (COUMADIN) 10 MG tablet Take 5-10 mg by mouth daily. Takes 10mg  (1 tab) on Tuesday, Wednesday, Thursday, Saturday, Sunday, 5 mg mon, fri   Yes Historical Provider, MD   . aspirin EC  81 mg Oral Daily  . atorvastatin  40 mg Oral q1800  . citalopram  40 mg Oral Daily  . furosemide  40 mg Intravenous Once  . [START ON 04/09/2013] furosemide  40 mg Oral Daily  . insulin  aspart  0-15 Units Subcutaneous TID WC  . insulin aspart  0-5 Units Subcutaneous QHS  . insulin aspart protamine- aspart  30 Units Subcutaneous BID WC  . magnesium oxide  200 mg Oral Daily  . metoprolol tartrate  25 mg Oral BID  . primidone  100 mg Oral QHS  . primidone  50 mg Oral q morning - 10a  . spironolactone  12.5 mg Oral BID  . warfarin  10 mg Oral ONCE-1800  . Warfarin - Pharmacist Dosing Inpatient   Does not apply q1800   PRN Meds albuterol, albuterol, budesonide, diphenoxylate-atropine, nitroGLYCERIN, ondansetron, promethazine Results for orders placed during the hospital encounter of 04/03/13 (from the past 48 hour(s))  GLUCOSE, CAPILLARY     Status: Abnormal   Collection Time    04/05/13  4:43 PM      Result Value Range   Glucose-Capillary 149 (*) 70 - 99 mg/dL   Comment 1 Documented in Chart     Comment 2 Notify RN    HEPARIN LEVEL (UNFRACTIONATED)     Status: None   Collection Time    04/05/13  6:58 PM      Result Value Range   Heparin Unfractionated 0.31  0.30 - 0.70 IU/mL   Comment:            IF HEPARIN RESULTS ARE BELOW     EXPECTED VALUES, AND PATIENT     DOSAGE HAS BEEN CONFIRMED,     SUGGEST FOLLOW UP TESTING     OF ANTITHROMBIN III LEVELS.  GLUCOSE, CAPILLARY     Status: Abnormal   Collection Time    04/05/13  9:12 PM      Result Value Range   Glucose-Capillary 131 (*) 70 - 99 mg/dL  BASIC METABOLIC PANEL     Status: Abnormal   Collection Time    04/06/13  4:12 AM      Result Value Range  Sodium 134 (*) 135 - 145 mEq/L   Potassium 3.8  3.5 - 5.1 mEq/L   Chloride 99  96 - 112 mEq/L   CO2 27  19 - 32 mEq/L   Glucose, Bld 84  70 - 99 mg/dL   BUN 26 (*) 6 - 23 mg/dL   Creatinine, Ser 1.61  0.50 - 1.35 mg/dL   Calcium 9.4  8.4 - 09.6 mg/dL   GFR calc non Af Amer 56 (*) >90 mL/min   GFR calc Af Amer 65 (*) >90 mL/min   Comment: (NOTE)     The eGFR has been calculated using the CKD EPI equation.     This calculation has not been validated in all  clinical situations.     eGFR's persistently <90 mL/min signify possible Chronic Kidney     Disease.  HEPARIN LEVEL (UNFRACTIONATED)     Status: None   Collection Time    04/06/13  4:12 AM      Result Value Range   Heparin Unfractionated 0.51  0.30 - 0.70 IU/mL   Comment:            IF HEPARIN RESULTS ARE BELOW     EXPECTED VALUES, AND PATIENT     DOSAGE HAS BEEN CONFIRMED,     SUGGEST FOLLOW UP TESTING     OF ANTITHROMBIN III LEVELS.  CBC     Status: Abnormal   Collection Time    04/06/13  4:12 AM      Result Value Range   WBC 11.0 (*) 4.0 - 10.5 K/uL   RBC 4.62  4.22 - 5.81 MIL/uL   Hemoglobin 13.2  13.0 - 17.0 g/dL   HCT 04.5  40.9 - 81.1 %   MCV 87.9  78.0 - 100.0 fL   MCH 28.6  26.0 - 34.0 pg   MCHC 32.5  30.0 - 36.0 g/dL   RDW 91.4 (*) 78.2 - 95.6 %   Platelets 213  150 - 400 K/uL  PROTIME-INR     Status: Abnormal   Collection Time    04/06/13  4:12 AM      Result Value Range   Prothrombin Time 21.3 (*) 11.6 - 15.2 seconds   INR 1.91 (*) 0.00 - 1.49  GLUCOSE, CAPILLARY     Status: Abnormal   Collection Time    04/06/13  8:15 AM      Result Value Range   Glucose-Capillary 115 (*) 70 - 99 mg/dL  GLUCOSE, CAPILLARY     Status: Abnormal   Collection Time    04/06/13 12:23 PM      Result Value Range   Glucose-Capillary 108 (*) 70 - 99 mg/dL  GLUCOSE, CAPILLARY     Status: None   Collection Time    04/06/13  5:14 PM      Result Value Range   Glucose-Capillary 76  70 - 99 mg/dL  GLUCOSE, CAPILLARY     Status: Abnormal   Collection Time    04/06/13  9:08 PM      Result Value Range   Glucose-Capillary 55 (*) 70 - 99 mg/dL  GLUCOSE, CAPILLARY     Status: Abnormal   Collection Time    04/06/13  9:43 PM      Result Value Range   Glucose-Capillary 109 (*) 70 - 99 mg/dL   Comment 1 Notify RN     Comment 2 Documented in Chart    BASIC METABOLIC PANEL     Status: Abnormal   Collection  Time    04/07/13  5:15 AM      Result Value Range   Sodium 132 (*) 135 - 145  mEq/L   Potassium 3.9  3.5 - 5.1 mEq/L   Chloride 98  96 - 112 mEq/L   CO2 24  19 - 32 mEq/L   Glucose, Bld 76  70 - 99 mg/dL   BUN 19  6 - 23 mg/dL   Creatinine, Ser 1.19  0.50 - 1.35 mg/dL   Calcium 9.3  8.4 - 14.7 mg/dL   GFR calc non Af Amer 60 (*) >90 mL/min   GFR calc Af Amer 70 (*) >90 mL/min   Comment: (NOTE)     The eGFR has been calculated using the CKD EPI equation.     This calculation has not been validated in all clinical situations.     eGFR's persistently <90 mL/min signify possible Chronic Kidney     Disease.  HEPARIN LEVEL (UNFRACTIONATED)     Status: None   Collection Time    04/07/13  5:15 AM      Result Value Range   Heparin Unfractionated 0.38  0.30 - 0.70 IU/mL   Comment:            IF HEPARIN RESULTS ARE BELOW     EXPECTED VALUES, AND PATIENT     DOSAGE HAS BEEN CONFIRMED,     SUGGEST FOLLOW UP TESTING     OF ANTITHROMBIN III LEVELS.  CBC     Status: Abnormal   Collection Time    04/07/13  5:15 AM      Result Value Range   WBC 9.4  4.0 - 10.5 K/uL   RBC 4.59  4.22 - 5.81 MIL/uL   Hemoglobin 13.2  13.0 - 17.0 g/dL   HCT 82.9  56.2 - 13.0 %   MCV 88.0  78.0 - 100.0 fL   MCH 28.8  26.0 - 34.0 pg   MCHC 32.7  30.0 - 36.0 g/dL   RDW 86.5 (*) 78.4 - 69.6 %   Platelets 205  150 - 400 K/uL  PROTIME-INR     Status: Abnormal   Collection Time    04/07/13  5:15 AM      Result Value Range   Prothrombin Time 20.5 (*) 11.6 - 15.2 seconds   INR 1.82 (*) 0.00 - 1.49  MAGNESIUM     Status: None   Collection Time    04/07/13  5:15 AM      Result Value Range   Magnesium 2.2  1.5 - 2.5 mg/dL  OCCULT BLOOD X 1 CARD TO LAB, STOOL     Status: None   Collection Time    04/07/13  5:40 AM      Result Value Range   Fecal Occult Bld NEGATIVE  NEGATIVE  GLUCOSE, CAPILLARY     Status: None   Collection Time    04/07/13  7:40 AM      Result Value Range   Glucose-Capillary 78  70 - 99 mg/dL  GLUCOSE, CAPILLARY     Status: None   Collection Time    04/07/13 12:18  PM      Result Value Range   Glucose-Capillary 90  70 - 99 mg/dL    No results found.             Blood pressure 115/72, pulse 57, temperature 97.6 F (36.4 C), temperature source Oral, resp. rate 18, height 5' 11.5" (1.816 m), weight 119.4 kg (263 lb  3.7 oz), SpO2 94.00%.  Physical exam:   General-- obese white male who is not in any severe distress quite talkative. He rambles on and is difficult to keep on topic Heart-- somewhat fast heart Lungs--clear Abdomen-- morbidly obese with few bowel sounds mild none localizing tenderness eerie   Assessment: 1. Diarrhea. This could be due to medications or any number of other things. C. difficile toxins negative. Still waiting for other studies. Despite the use of Coumadin, stools were negative at least on one occasion here in the hospital 2. Multiple other medical problems.  Plan: 1. We will wait for the results of the G.I. pathogen panel. 2. Due to the duration of diarrhea, he could have giardia or something of this nature. It may be worth a trial Flagyl however this would interfere with his Coumadin and what this would have to be monitored carefully. 3. He may need sigmoidoscopy with biopsy. 4. We will obtain abdominal films this has not  been done   Bruce Taylor,Iverna Hammac L 04/07/2013, 2:53 PM

## 2013-04-07 NOTE — Progress Notes (Signed)
Patient's CBG was 115. MD notified. Patient says he feels better.  Will continue to monitor patient.

## 2013-04-07 NOTE — Progress Notes (Signed)
DAILY PROGRESS NOTE  Subjective:  No events overnight. Still having some diarrhea. Rectal tube in place. I's/O's are matched, but sodium is trending down. No further chest pain. Most of the complaints are of back and leg pain.   Objective:  Temp:  [97.4 F (36.3 C)-98.1 F (36.7 C)] 97.6 F (36.4 C) (09/13 0448) Pulse Rate:  [53-107] 53 (09/13 0448) Resp:  [18-22] 18 (09/13 0448) BP: (122-151)/(51-77) 123/58 mmHg (09/13 0448) SpO2:  [92 %-96 %] 94 % (09/13 0448) Weight:  [263 lb 3.7 oz (119.4 kg)] 263 lb 3.7 oz (119.4 kg) (09/13 0500) Weight change: 4 lb 10.1 oz (2.1 kg)  Intake/Output from previous day: 09/12 0701 - 09/13 0700 In: 895.8 [P.O.:480; I.V.:415.8] Out: 850 [Urine:850]  Intake/Output from this shift:    Medications: Current Facility-Administered Medications  Medication Dose Route Frequency Provider Last Rate Last Dose  . 0.9 %  sodium chloride infusion   Intravenous Continuous Runell Gess, MD 10 mL/hr at 04/04/13 1900    . albuterol (PROVENTIL HFA;VENTOLIN HFA) 108 (90 BASE) MCG/ACT inhaler 1-2 puff  1-2 puff Inhalation Q6H PRN Abelino Derrick, PA-C      . albuterol (PROVENTIL) (5 MG/ML) 0.5% nebulizer solution 2.5 mg  2.5 mg Nebulization Q6H PRN Abelino Derrick, PA-C      . aspirin EC tablet 81 mg  81 mg Oral Daily Abelino Derrick, PA-C   81 mg at 04/06/13 1209  . atorvastatin (LIPITOR) tablet 40 mg  40 mg Oral q1800 Abelino Derrick, PA-C   40 mg at 04/06/13 1842  . budesonide (PULMICORT) nebulizer solution 0.5 mg  0.5 mg Nebulization BID PRN Eda Paschal Kilroy, PA-C      . citalopram (CELEXA) tablet 40 mg  40 mg Oral Daily Abelino Derrick, PA-C   40 mg at 04/06/13 1209  . diphenoxylate-atropine (LOMOTIL) 2.5-0.025 MG per tablet 1 tablet  1 tablet Oral QID PRN Abelino Derrick, PA-C      . furosemide (LASIX) injection 40 mg  40 mg Intravenous Once Marykay Lex, MD      . furosemide (LASIX) tablet 40 mg  40 mg Oral Daily Abelino Derrick, PA-C   40 mg at 04/06/13 1210  .  heparin ADULT infusion 100 units/mL (25000 units/250 mL)  1,500 Units/hr Intravenous Continuous Crystal Salomon Fick, RPH 15 mL/hr at 04/06/13 2033 1,500 Units/hr at 04/06/13 2033  . insulin aspart (novoLOG) injection 0-15 Units  0-15 Units Subcutaneous TID WC Abelino Derrick, PA-C   2 Units at 04/05/13 1818  . insulin aspart (novoLOG) injection 0-5 Units  0-5 Units Subcutaneous QHS Luke K Kilroy, PA-C      . insulin aspart protamine- aspart (NOVOLOG MIX 70/30) injection 30 Units  30 Units Subcutaneous BID WC Abelino Derrick, PA-C   30 Units at 04/06/13 1753  . magnesium oxide (MAG-OX) tablet 200 mg  200 mg Oral Daily Runell Gess, MD   200 mg at 04/06/13 1209  . metoprolol tartrate (LOPRESSOR) tablet 25 mg  25 mg Oral BID Eda Paschal Kilroy, PA-C   25 mg at 04/07/13 0013  . nitroGLYCERIN (NITROSTAT) SL tablet 0.4 mg  0.4 mg Sublingual Q5 min PRN Abelino Derrick, PA-C   0.4 mg at 04/06/13 1222  . ondansetron (ZOFRAN) injection 8 mg  8 mg Intravenous Q8H PRN Mihai Croitoru, MD   8 mg at 04/06/13 1235  . primidone (MYSOLINE) tablet 100 mg  100 mg Oral QHS Robbie Lis, PA-C  100 mg at 04/07/13 0012  . primidone (MYSOLINE) tablet 50 mg  50 mg Oral q morning - 10a Brittainy Simmons, PA-C   50 mg at 04/06/13 1209  . promethazine (PHENERGAN) injection 12.5 mg  12.5 mg Intravenous Q6H PRN Brittainy Simmons, PA-C   12.5 mg at 04/07/13 0716  . spironolactone (ALDACTONE) tablet 12.5 mg  12.5 mg Oral BID Eda Paschal Kilroy, PA-C   12.5 mg at 04/06/13 1842  . warfarin (COUMADIN) tablet 10 mg  10 mg Oral ONCE-1800 Laurence Slate, [facility]      . Warfarin - Pharmacist Dosing Inpatient   Does not apply q1800 Chinita Greenland, [facility]        Physical Exam: General appearance: alert and mild distress Neck: no adenopathy, no carotid bruit, no JVD, supple, symmetrical, trachea midline and thyroid not enlarged, symmetric, no tenderness/mass/nodules Lungs: clear to auscultation bilaterally Heart: regular rate and rhythm,  S1, S2 normal, no murmur, click, rub or gallop Abdomen: soft, mild TTP, no rebound or guarding Extremities: extremities normal, atraumatic, no cyanosis or edema Pulses: 2+ and symmetric Skin: Skin color, texture, turgor normal. No rashes or lesions Neurologic: Mental status: Alert, oriented, thought content appropriate  Lab Results: Results for orders placed during the hospital encounter of 04/03/13 (from the past 48 hour(s))  GLUCOSE, CAPILLARY     Status: Abnormal   Collection Time    04/05/13 12:15 PM      Result Value Range   Glucose-Capillary 164 (*) 70 - 99 mg/dL  GLUCOSE, CAPILLARY     Status: Abnormal   Collection Time    04/05/13  4:43 PM      Result Value Range   Glucose-Capillary 149 (*) 70 - 99 mg/dL   Comment 1 Documented in Chart     Comment 2 Notify RN    HEPARIN LEVEL (UNFRACTIONATED)     Status: None   Collection Time    04/05/13  6:58 PM      Result Value Range   Heparin Unfractionated 0.31  0.30 - 0.70 IU/mL   Comment:            IF HEPARIN RESULTS ARE BELOW     EXPECTED VALUES, AND PATIENT     DOSAGE HAS BEEN CONFIRMED,     SUGGEST FOLLOW UP TESTING     OF ANTITHROMBIN III LEVELS.  GLUCOSE, CAPILLARY     Status: Abnormal   Collection Time    04/05/13  9:12 PM      Result Value Range   Glucose-Capillary 131 (*) 70 - 99 mg/dL  BASIC METABOLIC PANEL     Status: Abnormal   Collection Time    04/06/13  4:12 AM      Result Value Range   Sodium 134 (*) 135 - 145 mEq/L   Potassium 3.8  3.5 - 5.1 mEq/L   Chloride 99  96 - 112 mEq/L   CO2 27  19 - 32 mEq/L   Glucose, Bld 84  70 - 99 mg/dL   BUN 26 (*) 6 - 23 mg/dL   Creatinine, Ser 5.62  0.50 - 1.35 mg/dL   Calcium 9.4  8.4 - 13.0 mg/dL   GFR calc non Af Amer 56 (*) >90 mL/min   GFR calc Af Amer 65 (*) >90 mL/min   Comment: (NOTE)     The eGFR has been calculated using the CKD EPI equation.     This calculation has not been validated in all clinical situations.     eGFR's  persistently <90 mL/min signify  possible Chronic Kidney     Disease.  HEPARIN LEVEL (UNFRACTIONATED)     Status: None   Collection Time    04/06/13  4:12 AM      Result Value Range   Heparin Unfractionated 0.51  0.30 - 0.70 IU/mL   Comment:            IF HEPARIN RESULTS ARE BELOW     EXPECTED VALUES, AND PATIENT     DOSAGE HAS BEEN CONFIRMED,     SUGGEST FOLLOW UP TESTING     OF ANTITHROMBIN III LEVELS.  CBC     Status: Abnormal   Collection Time    04/06/13  4:12 AM      Result Value Range   WBC 11.0 (*) 4.0 - 10.5 K/uL   RBC 4.62  4.22 - 5.81 MIL/uL   Hemoglobin 13.2  13.0 - 17.0 g/dL   HCT 30.8  65.7 - 84.6 %   MCV 87.9  78.0 - 100.0 fL   MCH 28.6  26.0 - 34.0 pg   MCHC 32.5  30.0 - 36.0 g/dL   RDW 96.2 (*) 95.2 - 84.1 %   Platelets 213  150 - 400 K/uL  PROTIME-INR     Status: Abnormal   Collection Time    04/06/13  4:12 AM      Result Value Range   Prothrombin Time 21.3 (*) 11.6 - 15.2 seconds   INR 1.91 (*) 0.00 - 1.49  GLUCOSE, CAPILLARY     Status: Abnormal   Collection Time    04/06/13  8:15 AM      Result Value Range   Glucose-Capillary 115 (*) 70 - 99 mg/dL  GLUCOSE, CAPILLARY     Status: Abnormal   Collection Time    04/06/13 12:23 PM      Result Value Range   Glucose-Capillary 108 (*) 70 - 99 mg/dL  GLUCOSE, CAPILLARY     Status: None   Collection Time    04/06/13  5:14 PM      Result Value Range   Glucose-Capillary 76  70 - 99 mg/dL  GLUCOSE, CAPILLARY     Status: Abnormal   Collection Time    04/06/13  9:08 PM      Result Value Range   Glucose-Capillary 55 (*) 70 - 99 mg/dL  GLUCOSE, CAPILLARY     Status: Abnormal   Collection Time    04/06/13  9:43 PM      Result Value Range   Glucose-Capillary 109 (*) 70 - 99 mg/dL   Comment 1 Notify RN     Comment 2 Documented in Chart    BASIC METABOLIC PANEL     Status: Abnormal   Collection Time    04/07/13  5:15 AM      Result Value Range   Sodium 132 (*) 135 - 145 mEq/L   Potassium 3.9  3.5 - 5.1 mEq/L   Chloride 98  96 - 112  mEq/L   CO2 24  19 - 32 mEq/L   Glucose, Bld 76  70 - 99 mg/dL   BUN 19  6 - 23 mg/dL   Creatinine, Ser 3.24  0.50 - 1.35 mg/dL   Calcium 9.3  8.4 - 40.1 mg/dL   GFR calc non Af Amer 60 (*) >90 mL/min   GFR calc Af Amer 70 (*) >90 mL/min   Comment: (NOTE)     The eGFR has been calculated using the CKD EPI equation.  This calculation has not been validated in all clinical situations.     eGFR's persistently <90 mL/min signify possible Chronic Kidney     Disease.  HEPARIN LEVEL (UNFRACTIONATED)     Status: None   Collection Time    04/07/13  5:15 AM      Result Value Range   Heparin Unfractionated 0.38  0.30 - 0.70 IU/mL   Comment:            IF HEPARIN RESULTS ARE BELOW     EXPECTED VALUES, AND PATIENT     DOSAGE HAS BEEN CONFIRMED,     SUGGEST FOLLOW UP TESTING     OF ANTITHROMBIN III LEVELS.  CBC     Status: Abnormal   Collection Time    04/07/13  5:15 AM      Result Value Range   WBC 9.4  4.0 - 10.5 K/uL   RBC 4.59  4.22 - 5.81 MIL/uL   Hemoglobin 13.2  13.0 - 17.0 g/dL   HCT 82.9  56.2 - 13.0 %   MCV 88.0  78.0 - 100.0 fL   MCH 28.8  26.0 - 34.0 pg   MCHC 32.7  30.0 - 36.0 g/dL   RDW 86.5 (*) 78.4 - 69.6 %   Platelets 205  150 - 400 K/uL  PROTIME-INR     Status: Abnormal   Collection Time    04/07/13  5:15 AM      Result Value Range   Prothrombin Time 20.5 (*) 11.6 - 15.2 seconds   INR 1.82 (*) 0.00 - 1.49  MAGNESIUM     Status: None   Collection Time    04/07/13  5:15 AM      Result Value Range   Magnesium 2.2  1.5 - 2.5 mg/dL  OCCULT BLOOD X 1 CARD TO LAB, STOOL     Status: None   Collection Time    04/07/13  5:40 AM      Result Value Range   Fecal Occult Bld NEGATIVE  NEGATIVE  GLUCOSE, CAPILLARY     Status: None   Collection Time    04/07/13  7:40 AM      Result Value Range   Glucose-Capillary 78  70 - 99 mg/dL    Imaging: No results found.  Assessment:  1. Principal Problem: 2.   Chest pain with minimal risk of acute coronary  syndrome 3. Active Problems: 4.   HYPERLIPIDEMIA 5.   HYPERTENSION 6.   COPD- chronic O2 7.   Chronic respiratory failure 8.   OSA (obstructive sleep apnea)- on C-pap 9.   CAD, multiple PCIs, last PCI Nov 2012 to his RCA for ISR. Myoview low risk May 2014 10.   Obesity, morbid 11.   DM (diabetes mellitus), type 2 with complications 12.   Chronic a-fib 13.   Pacemaker- MDT placed Jan 2010. Just checked by Dr Royann Shivers 14.   Long term (current) use of anticoagulants- Coumadin 15.   Tremor, essential 16.   Plan:  1. Still with diarrhea. FOBT negative. Further stool studies pending. Appreciate TRH consultation.. Will contact Dr. Ewing Schlein for evaluation. Hold lasix today due to excessive GI output and down-trending sodium. Not ready for SNF until GI issues resolved. Continue heparin until INR >2, appreciate pharmacy recs.  Time Spent Directly with Patient:  15 minutes  Length of Stay:  LOS: 4 days   Chrystie Nose, MD, [facility] Attending Cardiologist [facility]  Thailyn Khalid C 04/07/2013, 10:54 AM

## 2013-04-08 ENCOUNTER — Inpatient Hospital Stay (HOSPITAL_COMMUNITY): Payer: Medicare Other

## 2013-04-08 DIAGNOSIS — R141 Gas pain: Secondary | ICD-10-CM

## 2013-04-08 DIAGNOSIS — R112 Nausea with vomiting, unspecified: Secondary | ICD-10-CM

## 2013-04-08 LAB — CBC
Hemoglobin: 13.4 g/dL (ref 13.0–17.0)
MCH: 28.5 pg (ref 26.0–34.0)
RBC: 4.71 MIL/uL (ref 4.22–5.81)

## 2013-04-08 LAB — PROTIME-INR
INR: 2.34 — ABNORMAL HIGH (ref 0.00–1.49)
Prothrombin Time: 24.9 seconds — ABNORMAL HIGH (ref 11.6–15.2)

## 2013-04-08 LAB — BASIC METABOLIC PANEL
BUN: 23 mg/dL (ref 6–23)
Chloride: 96 mEq/L (ref 96–112)
GFR calc Af Amer: 71 mL/min — ABNORMAL LOW (ref >90)
Potassium: 4 mEq/L (ref 3.5–5.1)

## 2013-04-08 LAB — GLUCOSE, CAPILLARY
Glucose-Capillary: 100 mg/dL — ABNORMAL HIGH (ref 70–99)
Glucose-Capillary: 85 mg/dL (ref 70–99)
Glucose-Capillary: 58 mg/dL — ABNORMAL LOW (ref 70–99)

## 2013-04-08 LAB — MAGNESIUM: Magnesium: 2.1 mg/dL (ref 1.5–2.5)

## 2013-04-08 MED ORDER — WARFARIN SODIUM 7.5 MG PO TABS
7.5000 mg | ORAL_TABLET | Freq: Once | ORAL | Status: AC
  Administered 2013-04-08: 7.5 mg via ORAL
  Filled 2013-04-08: qty 1

## 2013-04-08 MED ORDER — ACETAMINOPHEN 650 MG RE SUPP: 325.0000 mg | RECTAL | Status: DC | PRN

## 2013-04-08 MED ORDER — DEXTROSE 5 % IV SOLN
INTRAVENOUS | Status: DC
  Administered 2013-04-08 – 2013-04-11 (×6): via INTRAVENOUS

## 2013-04-08 MED ORDER — KETOROLAC TROMETHAMINE 30 MG/ML IJ SOLN
30.0000 mg | Freq: Four times a day (QID) | INTRAMUSCULAR | Status: DC | PRN
  Administered 2013-04-08: 30 mg via INTRAVENOUS
  Filled 2013-04-08 (×2): qty 1

## 2013-04-08 MED ORDER — PHENOL 1.4 % MT LIQD
1.0000 | OROMUCOSAL | Status: DC | PRN
  Administered 2013-04-08: 1 via OROMUCOSAL
  Filled 2013-04-08: qty 177

## 2013-04-08 NOTE — Progress Notes (Signed)
DAILY PROGRESS NOTE  Subjective:  Nausea and vomiting overnight. No cardiac chest pain. INR now therapeutic, off of heparin. Continues to have diarrhea. There is gaseous distention of the stomach which is significant - suggesting possible gastic outlet or small bowel obstruction.  Objective:  Temp:  [97.6 F (36.4 C)-98.6 F (37 C)] 97.6 F (36.4 C) (09/14 0427) Pulse Rate:  [55-66] 66 (09/14 1128) Resp:  [18-20] 20 (09/14 0427) BP: (124-140)/(59-76) 140/66 mmHg (09/14 1128) SpO2:  [91 %-97 %] 97 % (09/14 0427) Weight:  [261 lb 7.5 oz (118.6 kg)] 261 lb 7.5 oz (118.6 kg) (09/14 0500) Weight change: -1 lb 12.2 oz (-0.8 kg)  Intake/Output from previous day: 09/13 0701 - 09/14 0700 In: 1388.8 [P.O.:820; I.V.:468.8] Out: 1250 [Urine:1250]  Intake/Output from this shift:    Medications: Current Facility-Administered Medications  Medication Dose Route Frequency Provider Last Rate Last Dose  . 0.9 %  sodium chloride infusion   Intravenous Continuous Runell Gess, MD 10 mL/hr at 04/04/13 1900    . acetaminophen (TYLENOL) tablet 650 mg  650 mg Oral Q4H PRN Rodolph Bong, MD      . albuterol (PROVENTIL HFA;VENTOLIN HFA) 108 (90 BASE) MCG/ACT inhaler 1-2 puff  1-2 puff Inhalation Q6H PRN Abelino Derrick, PA-C      . albuterol (PROVENTIL) (5 MG/ML) 0.5% nebulizer solution 2.5 mg  2.5 mg Nebulization Q6H PRN Eda Paschal Kilroy, PA-C      . aspirin EC tablet 81 mg  81 mg Oral Daily Abelino Derrick, PA-C   81 mg at 04/08/13 1130  . atorvastatin (LIPITOR) tablet 40 mg  40 mg Oral q1800 Abelino Derrick, PA-C   40 mg at 04/07/13 1930  . budesonide (PULMICORT) nebulizer solution 0.5 mg  0.5 mg Nebulization BID PRN Eda Paschal Kilroy, PA-C      . citalopram (CELEXA) tablet 40 mg  40 mg Oral Daily Abelino Derrick, PA-C   40 mg at 04/08/13 1130  . diphenoxylate-atropine (LOMOTIL) 2.5-0.025 MG per tablet 1 tablet  1 tablet Oral QID PRN Abelino Derrick, PA-C      . furosemide (LASIX) injection 40 mg  40 mg  Intravenous Once Marykay Lex, MD      . Melene Muller ON 04/09/2013] furosemide (LASIX) tablet 40 mg  40 mg Oral Daily Chrystie Nose, MD      . insulin aspart (novoLOG) injection 0-15 Units  0-15 Units Subcutaneous TID WC Abelino Derrick, PA-C   2 Units at 04/08/13 1133  . insulin aspart (novoLOG) injection 0-5 Units  0-5 Units Subcutaneous QHS Luke K Kilroy, PA-C      . insulin aspart protamine- aspart (NOVOLOG MIX 70/30) injection 15 Units  15 Units Subcutaneous BID WC Yaakov Guthrie, MD   15 Units at 04/08/13 1128  . magnesium oxide (MAG-OX) tablet 200 mg  200 mg Oral Daily Runell Gess, MD   200 mg at 04/08/13 1130  . metoprolol tartrate (LOPRESSOR) tablet 25 mg  25 mg Oral BID Abelino Derrick, PA-C   25 mg at 04/08/13 1129  . nitroGLYCERIN (NITROSTAT) SL tablet 0.4 mg  0.4 mg Sublingual Q5 min PRN Abelino Derrick, PA-C   0.4 mg at 04/06/13 1222  . ondansetron (ZOFRAN) injection 8 mg  8 mg Intravenous Q8H PRN Mihai Croitoru, MD   8 mg at 04/08/13 0131  . primidone (MYSOLINE) tablet 100 mg  100 mg Oral QHS Brittainy Simmons, PA-C   100 mg at 04/07/13  2223  . primidone (MYSOLINE) tablet 50 mg  50 mg Oral q morning - 10a Brittainy Simmons, PA-C   50 mg at 04/08/13 1130  . promethazine (PHENERGAN) injection 12.5 mg  12.5 mg Intravenous Q6H PRN Brittainy Simmons, PA-C   12.5 mg at 04/07/13 0716  . spironolactone (ALDACTONE) tablet 12.5 mg  12.5 mg Oral BID Eda Paschal Kilroy, PA-C   12.5 mg at 04/08/13 1129  . warfarin (COUMADIN) tablet 7.5 mg  7.5 mg Oral ONCE-1800 Christine E Shade, RPH      . Warfarin - Pharmacist Dosing Inpatient   Does not apply q1800 Chinita Greenland, [facility]        Physical Exam: General appearance: alert and no distress Neck: no adenopathy, no carotid bruit, no JVD, supple, symmetrical, trachea midline and thyroid not enlarged, symmetric, no tenderness/mass/nodules Lungs: diminished breath sounds bilaterally Heart: regular rate and rhythm, S1, S2 normal, no murmur, click, rub or  gallop Abdomen: distended, somewhat tympanitic, tinkling bowel sounds, mild TTP without rebound or guarding Extremities: edema trace bilaterally Pulses: 2+ and symmetric Skin: pale, warm, dry Neurologic: Mental status: Alert, oriented, thought content appropriate  Lab Results: Results for orders placed during the hospital encounter of 04/03/13 (from the past 48 hour(s))  GLUCOSE, CAPILLARY     Status: None   Collection Time    04/06/13  5:14 PM      Result Value Range   Glucose-Capillary 76  70 - 99 mg/dL  GLUCOSE, CAPILLARY     Status: Abnormal   Collection Time    04/06/13  9:08 PM      Result Value Range   Glucose-Capillary 55 (*) 70 - 99 mg/dL  GLUCOSE, CAPILLARY     Status: Abnormal   Collection Time    04/06/13  9:43 PM      Result Value Range   Glucose-Capillary 109 (*) 70 - 99 mg/dL   Comment 1 Notify RN     Comment 2 Documented in Chart    BASIC METABOLIC PANEL     Status: Abnormal   Collection Time    04/07/13  5:15 AM      Result Value Range   Sodium 132 (*) 135 - 145 mEq/L   Potassium 3.9  3.5 - 5.1 mEq/L   Chloride 98  96 - 112 mEq/L   CO2 24  19 - 32 mEq/L   Glucose, Bld 76  70 - 99 mg/dL   BUN 19  6 - 23 mg/dL   Creatinine, Ser 1.61  0.50 - 1.35 mg/dL   Calcium 9.3  8.4 - 09.6 mg/dL   GFR calc non Af Amer 60 (*) >90 mL/min   GFR calc Af Amer 70 (*) >90 mL/min   Comment: (NOTE)     The eGFR has been calculated using the CKD EPI equation.     This calculation has not been validated in all clinical situations.     eGFR's persistently <90 mL/min signify possible Chronic Kidney     Disease.  HEPARIN LEVEL (UNFRACTIONATED)     Status: None   Collection Time    04/07/13  5:15 AM      Result Value Range   Heparin Unfractionated 0.38  0.30 - 0.70 IU/mL   Comment:            IF HEPARIN RESULTS ARE BELOW     EXPECTED VALUES, AND PATIENT     DOSAGE HAS BEEN CONFIRMED,     SUGGEST FOLLOW UP TESTING  OF ANTITHROMBIN III LEVELS.  CBC     Status: Abnormal    Collection Time    04/07/13  5:15 AM      Result Value Range   WBC 9.4  4.0 - 10.5 K/uL   RBC 4.59  4.22 - 5.81 MIL/uL   Hemoglobin 13.2  13.0 - 17.0 g/dL   HCT 21.3  08.6 - 57.8 %   MCV 88.0  78.0 - 100.0 fL   MCH 28.8  26.0 - 34.0 pg   MCHC 32.7  30.0 - 36.0 g/dL   RDW 46.9 (*) 62.9 - 52.8 %   Platelets 205  150 - 400 K/uL  PROTIME-INR     Status: Abnormal   Collection Time    04/07/13  5:15 AM      Result Value Range   Prothrombin Time 20.5 (*) 11.6 - 15.2 seconds   INR 1.82 (*) 0.00 - 1.49  TSH     Status: None   Collection Time    04/07/13  5:15 AM      Result Value Range   TSH 1.885  0.350 - 4.500 uIU/mL   Comment: Performed at [facility]  MAGNESIUM     Status: None   Collection Time    04/07/13  5:15 AM      Result Value Range   Magnesium 2.2  1.5 - 2.5 mg/dL  FECAL LACTOFERRIN     Status: None   Collection Time    04/07/13  5:32 AM      Result Value Range   Specimen Description STOOL     Special Requests NONE     Fecal Lactoferrin       Value: NEGATIVE     Performed at [facility]   Report Status 04/07/2013 FINAL    OCCULT BLOOD X 1 CARD TO LAB, STOOL     Status: None   Collection Time    04/07/13  5:40 AM      Result Value Range   Fecal Occult Bld NEGATIVE  NEGATIVE  GLUCOSE, CAPILLARY     Status: None   Collection Time    04/07/13  7:40 AM      Result Value Range   Glucose-Capillary 78  70 - 99 mg/dL  GLUCOSE, CAPILLARY     Status: None   Collection Time    04/07/13 12:18 PM      Result Value Range   Glucose-Capillary 90  70 - 99 mg/dL  GLUCOSE, CAPILLARY     Status: Abnormal   Collection Time    04/07/13  4:22 PM      Result Value Range   Glucose-Capillary 67 (*) 70 - 99 mg/dL  GLUCOSE, CAPILLARY     Status: Abnormal   Collection Time    04/07/13  5:26 PM      Result Value Range   Glucose-Capillary 67 (*) 70 - 99 mg/dL  GLUCOSE, CAPILLARY     Status: None   Collection Time    04/07/13  5:28 PM      Result Value Range    Glucose-Capillary 74  70 - 99 mg/dL  GLUCOSE, CAPILLARY     Status: Abnormal   Collection Time    04/07/13 10:02 PM      Result Value Range   Glucose-Capillary 54 (*) 70 - 99 mg/dL   Comment 1 Notify RN     Comment 2 Documented in Chart    GLUCOSE, CAPILLARY     Status: Abnormal  Collection Time    04/07/13 10:56 PM      Result Value Range   Glucose-Capillary 69 (*) 70 - 99 mg/dL   Comment 1 Notify RN    GLUCOSE, CAPILLARY     Status: Abnormal   Collection Time    04/07/13 11:24 PM      Result Value Range   Glucose-Capillary 115 (*) 70 - 99 mg/dL   Comment 1 Notify RN    GLUCOSE, CAPILLARY     Status: Abnormal   Collection Time    04/08/13  1:21 AM      Result Value Range   Glucose-Capillary 100 (*) 70 - 99 mg/dL   Comment 1 Notify RN     Comment 2 Documented in Chart    GLUCOSE, CAPILLARY     Status: Abnormal   Collection Time    04/08/13  3:11 AM      Result Value Range   Glucose-Capillary 194 (*) 70 - 99 mg/dL   Comment 1 Notify RN    BASIC METABOLIC PANEL     Status: Abnormal   Collection Time    04/08/13  5:10 AM      Result Value Range   Sodium 129 (*) 135 - 145 mEq/L   Potassium 4.0  3.5 - 5.1 mEq/L   Chloride 96  96 - 112 mEq/L   CO2 21  19 - 32 mEq/L   Glucose, Bld 232 (*) 70 - 99 mg/dL   BUN 23  6 - 23 mg/dL   Creatinine, Ser 1.61  0.50 - 1.35 mg/dL   Calcium 9.3  8.4 - 09.6 mg/dL   GFR calc non Af Amer 61 (*) >90 mL/min   GFR calc Af Amer 71 (*) >90 mL/min   Comment: (NOTE)     The eGFR has been calculated using the CKD EPI equation.     This calculation has not been validated in all clinical situations.     eGFR's persistently <90 mL/min signify possible Chronic Kidney     Disease.  HEPARIN LEVEL (UNFRACTIONATED)     Status: None   Collection Time    04/08/13  5:10 AM      Result Value Range   Heparin Unfractionated 0.35  0.30 - 0.70 IU/mL   Comment:            IF HEPARIN RESULTS ARE BELOW     EXPECTED VALUES, AND PATIENT     DOSAGE HAS BEEN  CONFIRMED,     SUGGEST FOLLOW UP TESTING     OF ANTITHROMBIN III LEVELS.  CBC     Status: Abnormal   Collection Time    04/08/13  5:10 AM      Result Value Range   WBC 11.5 (*) 4.0 - 10.5 K/uL   RBC 4.71  4.22 - 5.81 MIL/uL   Hemoglobin 13.4  13.0 - 17.0 g/dL   HCT 04.5  40.9 - 81.1 %   MCV 87.7  78.0 - 100.0 fL   MCH 28.5  26.0 - 34.0 pg   MCHC 32.4  30.0 - 36.0 g/dL   RDW 91.4 (*) 78.2 - 95.6 %   Platelets 198  150 - 400 K/uL  PROTIME-INR     Status: Abnormal   Collection Time    04/08/13  5:10 AM      Result Value Range   Prothrombin Time 24.9 (*) 11.6 - 15.2 seconds   INR 2.34 (*) 0.00 - 1.49  GLUCOSE, CAPILLARY  Status: Abnormal   Collection Time    04/08/13  7:52 AM      Result Value Range   Glucose-Capillary 179 (*) 70 - 99 mg/dL  GLUCOSE, CAPILLARY     Status: Abnormal   Collection Time    04/08/13 11:31 AM      Result Value Range   Glucose-Capillary 135 (*) 70 - 99 mg/dL    Imaging: Dg Abd 2 Views  04/08/2013   CLINICAL DATA:  Abdominal pain, bloating  EXAM: ABDOMEN - 2 VIEW  COMPARISON:  10/24/2012  FINDINGS: There is significant gaseous distension of the stomach. Gastroparesis or gastric outlet obstruction cannot be excluded. Mild gaseous distension of the colon. No free abdominal air.  IMPRESSION: Significant gaseous distension of the stomach. Gastroparesis or gastric outlet obstruction cannot be excluded. No free abdominal air.   Electronically Signed   By: Natasha Mead   On: 04/08/2013 09:14    Assessment:  1. Principal Problem: 2.   Chest pain with minimal risk of acute coronary syndrome 3. Active Problems: 4.   HYPERLIPIDEMIA 5.   HYPERTENSION 6.   COPD- chronic O2 7.   Chronic respiratory failure 8.   OSA (obstructive sleep apnea)- on C-pap 9.   CAD, multiple PCIs, last PCI Nov 2012 to his RCA for ISR. Myoview low risk May 2014 10.   Obesity, morbid 11.   DM (diabetes mellitus), type 2 with complications 12.   Chronic a-fib 13.   Pacemaker- MDT  placed Jan 2010. Just checked by Dr Royann Shivers 14.   Long term (current) use of anticoagulants- Coumadin 15.   Tremor, essential 16.   Plan:  1. Reviewed abdominal x-rays, consistent with exam showing marked abdominal distention and tympany. I would recommend placement of NG tube to LIWS. Appreciate GI assistance. Discussed case with Dr. Janee Morn [facility]) - given the issues are primarily GI at this point, he has graciously agreed to assume care of this patient. His INR is therapeutic. No other cardiac issues at this time.  We are available as needed for cardiac issues if they arise. Please don't hesitate to call us.  Time Spent Directly with Patient:  15 minutes  Length of Stay:  LOS: 5 days   Chrystie Nose, MD, [facility] Attending Cardiologist [facility]  Huber Mathers C 04/08/2013, 12:41 PM

## 2013-04-08 NOTE — Progress Notes (Signed)
NG inserted without difficulty. Pt tolerated well. Left nare. Assessed for placement was positive. Low suction initated with immediate return of bile color secretions 1000 cc removed. Canister changed

## 2013-04-08 NOTE — Progress Notes (Signed)
Day shift RN reports Dr. Janee Morn said to hold all PO meds. No order written.   Pt had heparin d/c'd and placed on coumadin prior to this.  Pt has chronic fib, called NP who said give coumadin and clamp NG 30 min.  Coumadin given and ng suction held x 30 min.

## 2013-04-08 NOTE — Progress Notes (Signed)
[facility] PROGRESS NOTE Subjective Diarrhea unchanged, c diff and lactoferrin are negative. Pt vomiting large amounts during night. Only 2 stools since last night.  Objective: Vital signs in last 24 hours: Temp:  [97.6 F (36.4 C)-98.6 F (37 C)] 97.6 F (36.4 C) (09/14 0427) Pulse Rate:  [55-57] 56 (09/14 0427) Resp:  [18-20] 20 (09/14 0427) BP: (115-136)/(59-76) 136/76 mmHg (09/14 0427) SpO2:  [91 %-97 %] 97 % (09/14 0427) Weight:  [118.6 kg (261 lb 7.5 oz)] 118.6 kg (261 lb 7.5 oz) (09/14 0500) Last BM Date: 04/05/13  Intake/Output from previous day: 09/13 0701 - 09/14 0700 In: 1345 [P.O.:820; I.V.:425] Out: 950 [Urine:950] Intake/Output this shift:    PE: General--laying in bed not moving  Abdomen--slight distended and mild NL tenderness  Good BSs  Lab Results:  Recent Labs  04/06/13 0412 04/07/13 0515 04/08/13 0510  WBC 11.0* 9.4 11.5*  HGB 13.2 13.2 13.4  HCT 40.6 40.4 41.3  PLT 213 205 198   BMET  Recent Labs  04/05/13 0905 04/06/13 0412 04/07/13 0515 04/08/13 0510  NA 134* 134* 132* 129*  K 3.9 3.8 3.9 4.0  CL 98 99 98 96  CO2 24 27 24 21   CREATININE 1.14 1.23 1.17 1.15   LFT No results found for this basename: PROT, AST, ALT, ALKPHOS, BILITOT, BILIDIR, IBILI,  in the last 72 hours PT/INR  Recent Labs  04/06/13 0412 04/07/13 0515 04/08/13 0510  LABPROT 21.3* 20.5* 24.9*  INR 1.91* 1.82* 2.34*   PANCREAS No results found for this basename: LIPASE,  in the last 72 hours       Studies/Results: No results found.  Medications: I have reviewed the patient's current medications.  Assessment/Plan: 1. Chronic Diarrhea. Lactoferrin is negative infection less likely. GI path panel pending.  2. N+V Hard to put all this together. Has not had imaging of abd this admission. Diarrhea chronic and really unchanged for 2-3 months. Vomiting newer ? SBO etc.  Plan: Will start with acute abd series to r/o SBO and consider unprepped  sigmoid with biopsies if the GI path panel is negative. May need CT of abd, GE study etc.  Rache Klimaszewski JR,Glady Ouderkirk L 04/08/2013, 7:51 AM

## 2013-04-08 NOTE — Progress Notes (Signed)
[facility] PROGRESS NOTE/CONSULT Note  Bruce Taylor ZOX:[id number] DOB: 23-Jun-1941 DOA: 04/03/2013 PCP: Daisy Floro, MD  Assessment/Plan: #1 prob chronic diarrhea Ongoing greater than 2 months likely chronic in nature. C. difficile PCR is negative. Stool studies are pending. Patient has been seen by gastroenterology and following. Abd xray with significant gaseous distension, gastroparesis or GOO cannot be excluded. Per GI.  #2 chest pain Per primary team.  #3 hypertension Continue Lasix, Lopressor, spironolactone.  #4. diabetes mellitus Hemoglobin A1c is 8.3. CBGs have ranged from 100-194. Continue 70/30. Sliding scale insulin.  #5 COPD/chronic respiratory failure on chronic O2 Stable.  #6 obstructive sleep apnea CPAP. And  Code Status: Full Family Communication: Updated patient at bedside Disposition Plan: Per primary service   Consultants:  Gastroenterology: Dr. Randa Evens 04/07/2013  Procedures:  CT head without contrast 04/04/2013  Chest x-ray 04/03/2013  Abd xray 04/08/13  Antibiotics:  None  HPI/Subjective: Patient with no chenge with diarrhea. Patient with emesis overnight. Patient with nausea.  Objective: Filed Vitals:   04/08/13 0427  BP: 136/76  Pulse: 56  Temp: 97.6 F (36.4 C)  Resp: 20    Intake/Output Summary (Last 24 hours) at 04/08/13 1126 Last data filed at 04/08/13 0655  Gross per 24 hour  Intake 1288.75 ml  Output   1250 ml  Net  38.75 ml   Filed Weights   04/06/13 0505 04/07/13 0500 04/08/13 0500  Weight: 117.3 kg (258 lb 9.6 oz) 119.4 kg (263 lb 3.7 oz) 118.6 kg (261 lb 7.5 oz)    Exam:   General:  NAD  Cardiovascular: RRR  Respiratory: CTAB  Abdomen: Soft/NT/ Mild distension/ hypoactive BS/  Extremities: No c/c/ trace edema  Data Reviewed: Basic Metabolic Panel:  Recent Labs Lab 04/04/13 0558 04/05/13 0905 04/06/13 0412 04/07/13 0515 04/08/13 0510  NA 132* 134* 134* 132* 129*  K 4.3 3.9  3.8 3.9 4.0  CL 96 98 99 98 96  CO2 24 24 27 24 21   GLUCOSE 117* 89 84 76 232*  BUN 23 25* 26* 19 23  CREATININE 1.22 1.14 1.23 1.17 1.15  CALCIUM 9.2 9.2 9.4 9.3 9.3  MG  --   --   --  2.2  --    Liver Function Tests: No results found for this basename: AST, ALT, ALKPHOS, BILITOT, PROT, ALBUMIN,  in the last 168 hours No results found for this basename: LIPASE, AMYLASE,  in the last 168 hours No results found for this basename: AMMONIA,  in the last 168 hours CBC:  Recent Labs Lab 04/03/13 1204 04/04/13 0558 04/06/13 0412 04/07/13 0515 04/08/13 0510  WBC 10.6* 10.4 11.0* 9.4 11.5*  HGB 13.2 13.5 13.2 13.2 13.4  HCT 39.2 40.2 40.6 40.4 41.3  MCV 88.1 87.2 87.9 88.0 87.7  PLT 198 195 213 205 198   Cardiac Enzymes:  Recent Labs Lab 04/03/13 1737 04/04/13 0026 04/04/13 0558  TROPONINI <0.30 <0.30 <0.30   BNP (last 3 results)  Recent Labs  04/03/13 1204  PROBNP 2587.0*   CBG:  Recent Labs Lab 04/07/13 2256 04/07/13 2324 04/08/13 0121 04/08/13 0311 04/08/13 0752  GLUCAP 69* 115* 100* 194* 179*    Recent Results (from the past 240 hour(s))  MRSA PCR SCREENING     Status: None   Collection Time    04/03/13  5:11 PM      Result Value Range Status   MRSA by PCR NEGATIVE  NEGATIVE Final   Comment:  The GeneXpert MRSA Assay (FDA     approved for NASAL specimens     only), is one component of a     comprehensive MRSA colonization     surveillance program. It is not     intended to diagnose MRSA     infection nor to guide or     monitor treatment for     MRSA infections.  CLOSTRIDIUM DIFFICILE BY PCR     Status: None   Collection Time    04/05/13 10:09 AM      Result Value Range Status   C difficile by pcr NEGATIVE  NEGATIVE Final     Studies: Dg Abd 2 Views  04/08/2013   CLINICAL DATA:  Abdominal pain, bloating  EXAM: ABDOMEN - 2 VIEW  COMPARISON:  10/24/2012  FINDINGS: There is significant gaseous distension of the stomach. Gastroparesis  or gastric outlet obstruction cannot be excluded. Mild gaseous distension of the colon. No free abdominal air.  IMPRESSION: Significant gaseous distension of the stomach. Gastroparesis or gastric outlet obstruction cannot be excluded. No free abdominal air.   Electronically Signed   By: Natasha Mead   On: 04/08/2013 09:14    Scheduled Meds: . aspirin EC  81 mg Oral Daily  . atorvastatin  40 mg Oral q1800  . citalopram  40 mg Oral Daily  . furosemide  40 mg Intravenous Once  . [START ON 04/09/2013] furosemide  40 mg Oral Daily  . insulin aspart  0-15 Units Subcutaneous TID WC  . insulin aspart  0-5 Units Subcutaneous QHS  . insulin aspart protamine- aspart  15 Units Subcutaneous BID WC  . magnesium oxide  200 mg Oral Daily  . metoprolol tartrate  25 mg Oral BID  . primidone  100 mg Oral QHS  . primidone  50 mg Oral q morning - 10a  . spironolactone  12.5 mg Oral BID  . warfarin  7.5 mg Oral ONCE-1800  . Warfarin - Pharmacist Dosing Inpatient   Does not apply q1800   Continuous Infusions: . sodium chloride 10 mL/hr at 04/04/13 1900    Principal Problem:   Chest pain with minimal risk of acute coronary syndrome Active Problems:   HYPERLIPIDEMIA   HYPERTENSION   COPD- chronic O2   Chronic respiratory failure   OSA (obstructive sleep apnea)- on C-pap   CAD, multiple PCIs, last PCI Nov 2012 to his RCA for ISR. 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 Just checked by Dr Royann Shivers   Long term (current) use of anticoagulants- Coumadin   Tremor, essential    Time spent: > 35 mins    [facility]  [facility] Pager [phone]. If 7PM-7AM, please contact night-coverage at [email or web address], password [facility] 04/08/2013, 11:26 AM  LOS: 5 days

## 2013-04-08 NOTE — Progress Notes (Signed)
The patient had an episode of brown, liquid emesis, approximately 125 ml. Zofran had been given to patient. MD was notified.

## 2013-04-08 NOTE — Progress Notes (Addendum)
ANTICOAGULATION CONSULT NOTE - Follow Up Consult  Pharmacy Consult for Heparin, Warfarin Indication: chest pain/ACS  Allergies  Allergen Reactions  . Penicillins Anaphylaxis and Swelling    "swell up all over my body; throat mainly, I think" (07/13/2012)  . Chlorhexidine Gluconate Itching    Chg cloth "don't remember how bad it was" (07/13/2012)    Patient Measurements: Height: 5' 11.5" (181.6 cm) Weight: 261 lb 7.5 oz (118.6 kg) IBW/kg (Calculated) : 76.45 Heparin Dosing Weight: 103 kg  Vital Signs: Temp: 97.6 F (36.4 C) (09/14 0427) Temp src: Oral (09/14 0427) BP: 136/76 mmHg (09/14 0427) Pulse Rate: 56 (09/14 0427)  Labs:  Recent Labs  04/06/13 0412 04/07/13 0515 04/08/13 0510  HGB 13.2 13.2 13.4  HCT 40.6 40.4 41.3  PLT 213 205 198  LABPROT 21.3* 20.5* 24.9*  INR 1.91* 1.82* 2.34*  HEPARINUNFRC 0.51 0.38 0.35  CREATININE 1.23 1.17 1.15    Estimated Creatinine Clearance: 75.5 ml/min (by C-G formula based on Cr of 1.15).   Assessment:  59 YOF presented to [facility] with CP on 9/9 and was started on heparin for unstable angina. Pt also has a h/o Afib on warfarin PTA (last dose 9/8, admit INR 2.53 on 10mg  daily except 5mg  Mon / Fri). Patient has ruled out for MI and will continue on heparin until INR is back in therapeutic range of 2-3.  Warfarin was restarted on 9/11.  INR (2.34) is therapeutic this AM after 10mg  x3 doses.  Heparin level (0.35) is therapeutic this AM on 1500 units/h  CBC is stable and there is no bleeding noted  Goal of Therapy:  INR 2-3 Heparin level 0.3-0.7 units/ml Monitor platelets by anticoagulation protocol: Yes   Plan:   D/C Heparin drip.  Warfarin 7.5mg  PO x1 tonight.  Follow up daily INR and CBC.  Monitor for bleeding.   Thank you for the consult.  Lynann Beaver PharmD, BCPS Pager [phone] 04/08/2013 7:32 AM

## 2013-04-09 ENCOUNTER — Encounter (HOSPITAL_COMMUNITY)
Admission: EM | Disposition: A | Discharge status: Skilled Nursing Facility | Payer: Medicare Other | Source: Home / Self Care | Attending: Cardiovascular Disease

## 2013-04-09 ENCOUNTER — Inpatient Hospital Stay (HOSPITAL_COMMUNITY): Payer: Medicare Other

## 2013-04-09 ENCOUNTER — Encounter (HOSPITAL_COMMUNITY): Payer: Self-pay | Admitting: *Deleted

## 2013-04-09 ENCOUNTER — Other Ambulatory Visit: Payer: Self-pay | Admitting: Gastroenterology

## 2013-04-09 HISTORY — PX: FLEXIBLE SIGMOIDOSCOPY: SHX5431

## 2013-04-09 LAB — MAGNESIUM: Magnesium: 2.2 mg/dL (ref 1.5–2.5)

## 2013-04-09 LAB — GI PATHOGEN PANEL BY PCR, STOOL
C difficile toxin A/B: NEGATIVE
Campylobacter by PCR: NEGATIVE
Cryptosporidium by PCR: NEGATIVE
G lamblia by PCR: NEGATIVE
Norovirus GI/GII: NEGATIVE
Rotavirus A by PCR: NEGATIVE

## 2013-04-09 LAB — PROTIME-INR
INR: 2.86 — ABNORMAL HIGH (ref 0.00–1.49)
Prothrombin Time: 29 seconds — ABNORMAL HIGH (ref 11.6–15.2)

## 2013-04-09 LAB — GLUCOSE, CAPILLARY
Glucose-Capillary: 139 mg/dL — ABNORMAL HIGH (ref 70–99)
Glucose-Capillary: 196 mg/dL — ABNORMAL HIGH (ref 70–99)

## 2013-04-09 LAB — BASIC METABOLIC PANEL
CO2: 27 mEq/L (ref 19–32)
Chloride: 96 mEq/L (ref 96–112)
GFR calc Af Amer: 64 mL/min — ABNORMAL LOW (ref >90)
Potassium: 3.4 mEq/L — ABNORMAL LOW (ref 3.5–5.1)

## 2013-04-09 SURGERY — SIGMOIDOSCOPY, FLEXIBLE
Anesthesia: Moderate Sedation

## 2013-04-09 MED ORDER — POTASSIUM CHLORIDE 10 MEQ/100ML IV SOLN
10.0000 meq | INTRAVENOUS | Status: AC
  Administered 2013-04-09 (×2): 10 meq via INTRAVENOUS
  Filled 2013-04-09 (×5): qty 100

## 2013-04-09 MED ORDER — MIDAZOLAM HCL 10 MG/2ML IJ SOLN
INTRAMUSCULAR | Status: DC | PRN
  Administered 2013-04-09: 1 mg via INTRAVENOUS

## 2013-04-09 MED ORDER — INSULIN GLARGINE 100 UNIT/ML ~~LOC~~ SOLN
20.0000 [IU] | Freq: Every day | SUBCUTANEOUS | Status: DC
  Administered 2013-04-09 – 2013-04-11 (×3): 20 [IU] via SUBCUTANEOUS
  Filled 2013-04-09 (×4): qty 0.2

## 2013-04-09 MED ORDER — FENTANYL CITRATE 0.05 MG/ML IJ SOLN
INTRAMUSCULAR | Status: DC | PRN
  Administered 2013-04-09: 25 ug via INTRAVENOUS

## 2013-04-09 MED ORDER — MIDAZOLAM HCL 10 MG/2ML IJ SOLN
INTRAMUSCULAR | Status: AC
  Filled 2013-04-09: qty 2

## 2013-04-09 MED ORDER — WARFARIN SODIUM 2.5 MG PO TABS
2.5000 mg | ORAL_TABLET | Freq: Once | ORAL | Status: AC
  Administered 2013-04-09: 17:00:00 2.5 mg via ORAL
  Filled 2013-04-09: qty 1

## 2013-04-09 MED ORDER — FENTANYL CITRATE 0.05 MG/ML IJ SOLN
INTRAMUSCULAR | Status: AC
  Filled 2013-04-09: qty 2

## 2013-04-09 MED ORDER — POTASSIUM CHLORIDE CRYS ER 20 MEQ PO TBCR
40.0000 meq | EXTENDED_RELEASE_TABLET | Freq: Once | ORAL | Status: AC
  Administered 2013-04-09: 17:00:00 40 meq via ORAL
  Filled 2013-04-09: qty 2

## 2013-04-09 MED ORDER — SODIUM CHLORIDE 0.9 % IV SOLN: INTRAVENOUS | Status: DC

## 2013-04-09 NOTE — Progress Notes (Signed)
Patient unable to wear CPAP tonight due to NG tube placement.  Patient tolerating nasal cannula well at this time.

## 2013-04-09 NOTE — Progress Notes (Signed)
Pt unable to wear CPAP tonight due to NG in place.

## 2013-04-09 NOTE — Progress Notes (Signed)
Inpatient Diabetes Program Recommendations  AACE/ADA: New Consensus Statement on Inpatient Glycemic Control (2013)  Target Ranges:  Prepandial:   less than 140 mg/dL      Peak postprandial:   less than 180 mg/dL (1-2 hours)      Critically ill patients:  140 - 180 mg/dL   Reason for Visit: Hypoglycemia  Pt on 70/30 42 - 46 units bid at home. Had hypoglycemia this am, probably d/t no po intake with 70/30 insulin. Results for Bruce, Taylor (MRN [id number]) as of 04/09/2013 12:40  Ref. Range 04/08/2013 01:21 04/08/2013 03:11 04/08/2013 07:52 04/08/2013 11:31 04/08/2013 16:53 04/08/2013 22:28 04/09/2013 08:16 04/09/2013 11:49  Glucose-Capillary Latest Range: 70-99 mg/dL 409 (H) 811 (H) 914 (H) 135 (H) 85 58 (L) 139 (H) 161 (H)   Results for Bruce, Taylor (MRN [id number]) as of 04/09/2013 12:40  Ref. Range 04/03/2013 17:37  Hemoglobin A1C Latest Range: <5.7 % 8.3 (H)    Inpatient Diabetes Program Recommendations Insulin - Basal: D/C 70/30 insulin while NPO/CL. Give Lantus 20 units QHS. Restart 70/30 when diet has been advanced and pt eating >50% of meal  Note: Will continue to follow while inpatient.  Thank you. Ailene Ards, RD, LDN, CDE Inpatient Diabetes Coordinator [phone]

## 2013-04-09 NOTE — Progress Notes (Signed)
ANTICOAGULATION CONSULT NOTE - Follow Up Consult  Pharmacy Consult for Warfarin Indication: atrial fibrillation  Allergies  Allergen Reactions  . Penicillins Anaphylaxis and Swelling    "swell up all over my body; throat mainly, I think" (07/13/2012)  . Chlorhexidine Gluconate Itching    Chg cloth "don't remember how bad it was" (07/13/2012)    Labs:  Recent Labs  04/07/13 0515 04/08/13 0510 04/09/13 0428  HGB 13.2 13.4  --   HCT 40.4 41.3  --   PLT 205 198  --   LABPROT 20.5* 24.9* 29.0*  INR 1.82* 2.34* 2.86*  HEPARINUNFRC 0.38 0.35  --   CREATININE 1.17 1.15 1.25    Estimated Creatinine Clearance: 68 ml/min (by C-G formula based on Cr of 1.25).    Assessment: 54 YOF presented to [name] with CP on 9/9 and was started on heparin for unstable angina. Pt also has a h/o Afib on warfarin PTA (last dose 9/8, admit INR 2.53 on 10mg  daily except 5mg  Mon / Fri). Patient has ruled out for MI, warfarin resumed 9/11 and IV heparin continued until 9/14 when INR was back in therapeutic range.   INR 2.86, large rise in INR given previously high doses of Coumadin. Flex sig for chronic diarrhea showed internal hemorrhoids, otherwise wnl. Abx X-ray with significant gaseous distension of the stomach - cannot exclude GOO. Patient is now NPO. No new CBC today   Goal of Therapy:  INR 2-3 Monitor platelets by anticoagulation protocol: Yes   Plan:   Coumadin 2.5 mg po x 1 tonight   Daily PT/INR  Pharmacy will f/u  Bruce Taylor, PharmD, BCPS Pager: [phone] 1:23 PM Pharmacy #: [id number]

## 2013-04-09 NOTE — Progress Notes (Signed)
Patient back from endo, alert and oriented, denies any pain/distress. Vitals WNL, will continue to assess patient.

## 2013-04-09 NOTE — Progress Notes (Signed)
[facility] PROGRESS NOTE/CONSULT Note  Bruce Taylor WJX:[id number] DOB: 07/10/1941 DOA: 04/03/2013 PCP: Daisy Floro, MD  Assessment/Plan: #1 prob chronic diarrhea Ongoing greater than 2 months likely chronic in nature. ?? Chronic constipation with overflow diarrhea. C. difficile PCR is negative. Stool studies are pending. Patient has been seen by gastroenterology. Patient s/p sigmoidoscopy with biopsy 04/09/13. Patient started on clears per GI.  Per GI.  #2 chest pain/Chronic afib Per cardiology low risk. Myoview low risk may 2014. Continue current medical management per cardiology. Continue lopressor for rate control. Coumadin for anticoagulation. Per cardiology  #3 hypertension Continue Lasix, Lopressor, spironolactone.  #4. diabetes mellitus Hemoglobin A1c is 8.3. CBGs have ranged from 139-161. discontinue 70/30. Start Lantus 20 units QHS. Sliding scale insulin.  #5 COPD/chronic respiratory failure on chronic O2 Stable.  #6 obstructive sleep apnea CPAP.  Code Status: Full Family Communication: Updated patient at bedside Disposition Plan: Home when medically stable.   Consultants:  Gastroenterology: Dr. Randa Evens 04/07/2013  Cardiology: Dr Rennis Golden  Procedures:  CT head without contrast 04/04/2013  Chest x-ray 04/03/2013  Abd xray 04/08/13  Antibiotics:  None  HPI/Subjective: Patient with no with diarrhea. Patient with NGT output. Patient s/p flex sig. Tolerating clears.  Objective: Filed Vitals:   04/09/13 1203  BP:   Pulse: 66  Temp:   Resp:     Intake/Output Summary (Last 24 hours) at 04/09/13 1358 Last data filed at 04/09/13 0622  Gross per 24 hour  Intake 1433.75 ml  Output   3850 ml  Net -2416.25 ml   Filed Weights   04/07/13 0500 04/08/13 0500 04/09/13 0500  Weight: 119.4 kg (263 lb 3.7 oz) 118.6 kg (261 lb 7.5 oz) 113.5 kg (250 lb 3.6 oz)    Exam:   General:  NAD  Cardiovascular: RRR  Respiratory: CTAB  Abdomen:  Soft/NT/ ND/ hypoactive BS/  Extremities: No c/c/ trace edema  Data Reviewed: Basic Metabolic Panel:  Recent Labs Lab 04/05/13 0905 04/06/13 0412 04/07/13 0515 04/08/13 0510 04/09/13 0428  NA 134* 134* 132* 129* 135  K 3.9 3.8 3.9 4.0 3.4*  CL 98 99 98 96 96  CO2 24 27 24 21 27   GLUCOSE 89 84 76 232* 95  BUN 25* 26* 19 23 24*  CREATININE 1.14 1.23 1.17 1.15 1.25  CALCIUM 9.2 9.4 9.3 9.3 9.7  MG  --   --  2.2 2.1 2.2   Liver Function Tests: No results found for this basename: AST, ALT, ALKPHOS, BILITOT, PROT, ALBUMIN,  in the last 168 hours No results found for this basename: LIPASE, AMYLASE,  in the last 168 hours No results found for this basename: AMMONIA,  in the last 168 hours CBC:  Recent Labs Lab 04/03/13 1204 04/04/13 0558 04/06/13 0412 04/07/13 0515 04/08/13 0510  WBC 10.6* 10.4 11.0* 9.4 11.5*  HGB 13.2 13.5 13.2 13.2 13.4  HCT 39.2 40.2 40.6 40.4 41.3  MCV 88.1 87.2 87.9 88.0 87.7  PLT 198 195 213 205 198   Cardiac Enzymes:  Recent Labs Lab 04/03/13 1737 04/04/13 0026 04/04/13 0558  TROPONINI <0.30 <0.30 <0.30   BNP (last 3 results)  Recent Labs  04/03/13 1204  PROBNP 2587.0*   CBG:  Recent Labs Lab 04/08/13 1131 04/08/13 1653 04/08/13 2228 04/09/13 0816 04/09/13 1149  GLUCAP 135* 85 58* 139* 161*    Recent Results (from the past 240 hour(s))  MRSA PCR SCREENING     Status: None   Collection Time    04/03/13  5:11  PM      Result Value Range Status   MRSA by PCR NEGATIVE  NEGATIVE Final   Comment:            The GeneXpert MRSA Assay (FDA     approved for NASAL specimens     only), is one component of a     comprehensive MRSA colonization     surveillance program. It is not     intended to diagnose MRSA     infection nor to guide or     monitor treatment for     MRSA infections.  CLOSTRIDIUM DIFFICILE BY PCR     Status: None   Collection Time    04/05/13 10:09 AM      Result Value Range Status   C difficile by pcr  NEGATIVE  NEGATIVE Final     Studies: Dg Abd 2 Views  04/08/2013   CLINICAL DATA:  Abdominal pain, bloating  EXAM: ABDOMEN - 2 VIEW  COMPARISON:  10/24/2012  FINDINGS: There is significant gaseous distension of the stomach. Gastroparesis or gastric outlet obstruction cannot be excluded. Mild gaseous distension of the colon. No free abdominal air.  IMPRESSION: Significant gaseous distension of the stomach. Gastroparesis or gastric outlet obstruction cannot be excluded. No free abdominal air.   Electronically Signed   By: Natasha Mead   On: 04/08/2013 09:14    Scheduled Meds: . aspirin EC  81 mg Oral Daily  . atorvastatin  40 mg Oral q1800  . citalopram  40 mg Oral Daily  . furosemide  40 mg Intravenous Once  . furosemide  40 mg Oral Daily  . insulin aspart  0-15 Units Subcutaneous TID WC  . insulin aspart  0-5 Units Subcutaneous QHS  . insulin aspart protamine- aspart  15 Units Subcutaneous BID WC  . magnesium oxide  200 mg Oral Daily  . metoprolol tartrate  25 mg Oral BID  . potassium chloride  10 mEq Intravenous Q1 Hr x 5  . primidone  100 mg Oral QHS  . primidone  50 mg Oral q morning - 10a  . spironolactone  12.5 mg Oral BID  . warfarin  2.5 mg Oral ONCE-1800  . Warfarin - Pharmacist Dosing Inpatient   Does not apply q1800   Continuous Infusions: . dextrose 75 mL/hr at 04/08/13 2242    Principal Problem:   Chest pain with minimal risk of acute coronary syndrome Active Problems:   HYPERLIPIDEMIA   HYPERTENSION   COPD- chronic O2   Chronic respiratory failure   OSA (obstructive sleep apnea)- on C-pap   CAD, multiple PCIs, last PCI Nov 2012 to his RCA for ISR. 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 Just checked by Dr Royann Shivers   Long term (current) use of anticoagulants- Coumadin   Tremor, essential    Time spent: > 35 mins    [facility]  [facility] Pager [phone]. If 7PM-7AM, please contact night-coverage at [email or web address], password [facility] 04/09/2013, 1:58 PM  LOS: 6 days

## 2013-04-09 NOTE — Op Note (Signed)
[facility] [address] [place], [address]   FLEXIBLE SIGMOIDOSCOPY PROCEDURE REPORT  PATIENT: Bruce Taylor, Bruce Taylor  MR#: [id number] BIRTHDATE: 09-18-1940 , 71  yrs. old GENDER: Male ENDOSCOPIST: Vida Rigger, MD REFERRED BY: PROCEDURE DATE:  04/09/2013 PROCEDURE:   Sigmoidoscopy with biopsy ASA CLASS:   Class III INDICATIONS:chronic diarrhea. MEDICATIONS: Fentanyl 25 mcg IV and Versed 1mg  IV  DESCRIPTION OF PROCEDURE:   After the risks benefits and alternatives of the procedure were thoroughly explained, informed consent was obtained.  revealed no abnormalities of the rectum. The [id number]  endoscope was introduced through the anus  and advanced to the descending colon , limited by No adverse events experienced. The quality of the prep was adequate .  The instrument was then slowly withdrawn as the mucosa was fully examined.a few shallow rectal and distal sigmoid erosions were seen and biopsied at the end of the proceed  and the mid sigmoid proximal sigmoid and parts of the descending evaluated were normal and a couple biopsies were obtained but other than some formed stool in the rectum no other abnormalities were seen and anal rectal pull-through and retroflexion was done and then the scope was advanced a short ways of the left side of the colon and air was suctioned and the scope was removed and the patient tolerated the procedure well there was no obvious immediate complication       Retroflexed views revealed internal hemorrhoid.    The scope was then withdrawn from the patient and the procedure terminated.  COMPLICATIONS: There were no complications.  ENDOSCOPIC IMPRESSION:1. Tiny internal hemorrhoids only 2 few shallow rectal and distal sigmoid erosions status post probably stercoral ulcers from chronic constipation and overflow diarrhea 3 otherwise within normal limits to probably the mid descending status post a few normal  biopsies  RECOMMENDATIONS:await pathology allow clear liquids but keep NG tube in place in case suctioned as needed and if doing well tomorrow might remove NG and slowly advance diet and I'm happy see back when necessary   REPEAT EXAM:   _______________________________ eSigned:  Vida Rigger, MD 04/09/2013 10:58 AM   CC:  PATIENT NAME:  Bruce Taylor, Bruce Taylor MR#: [id number]

## 2013-04-10 ENCOUNTER — Encounter (HOSPITAL_COMMUNITY): Payer: Self-pay | Admitting: Student

## 2013-04-10 LAB — BASIC METABOLIC PANEL
GFR calc Af Amer: 76 mL/min — ABNORMAL LOW (ref >90)
GFR calc non Af Amer: 65 mL/min — ABNORMAL LOW (ref >90)
Glucose, Bld: 149 mg/dL — ABNORMAL HIGH (ref 70–99)
Potassium: 3.9 mEq/L (ref 3.5–5.1)
Sodium: 129 mEq/L — ABNORMAL LOW (ref 135–145)

## 2013-04-10 LAB — CBC
HCT: 40.9 % (ref 39.0–52.0)
Platelets: 216 10*3/uL (ref 150–400)
RDW: 15.4 % (ref 11.5–15.5)
WBC: 9.8 10*3/uL (ref 4.0–10.5)

## 2013-04-10 LAB — GLUCOSE, CAPILLARY

## 2013-04-10 LAB — PROTIME-INR
INR: 3.55 — ABNORMAL HIGH (ref 0.00–1.49)
Prothrombin Time: 34.2 seconds — ABNORMAL HIGH (ref 11.6–15.2)

## 2013-04-10 NOTE — Progress Notes (Signed)
[facility] PROGRESS NOTE/CONSULT Note  Bruce Taylor ZOX:[id number] DOB: 05/11/1941 DOA: 04/03/2013 PCP: Daisy Floro, MD  Assessment/Plan: #1 prob overflow diarrhea in setting of constipation Ongoing greater than 2 months likely chronic in nature. Chronic constipation with overflow diarrhea. C. difficile PCR is negative. Stool studies are pending. Patient has been seen by gastroenterology. Patient s/p sigmoidoscopy with biopsy 04/09/13. Patient started on clears per GI. NGT removed. If doing well advance diet tomorrow. Biopsies pending.  Per GI.  #2 chest pain/Chronic afib Per cardiology low risk. Myoview low risk may 2014. Continue current medical management per cardiology. Continue lopressor for rate control. Coumadin for anticoagulation. Per cardiology  #3 hypertension Continue Lasix, Lopressor, spironolactone.  #4. diabetes mellitus Hemoglobin A1c is 8.3. CBGs have ranged from 163-183. discontinued 70/30. Continue Lantus 20 units QHS. Sliding scale insulin.  #5 COPD/chronic respiratory failure on chronic O2 Stable.  #6 obstructive sleep apnea CPAP.  Code Status: Full Family Communication: Updated patient at bedside Disposition Plan: snf when medically stable   Consultants:  Gastroenterology: Dr. Randa Evens 04/07/2013  Cardiology: Dr Rennis Golden  Procedures:  CT head without contrast 04/04/2013  Chest x-ray 04/03/2013  Abd xray 04/08/13  Antibiotics:  None  HPI/Subjective: Patient with no with diarrhea, AND NL bm. Patient with NGT out. Patient s/p flex sig. Tolerating clears.  Objective: Filed Vitals:   04/10/13 1440  BP: 111/78  Pulse: 55  Temp: 97.8 F (36.6 C)  Resp: 18    Intake/Output Summary (Last 24 hours) at 04/10/13 1445 Last data filed at 04/10/13 1443  Gross per 24 hour  Intake 2396.25 ml  Output   1850 ml  Net 546.25 ml   Filed Weights   04/08/13 0500 04/09/13 0500 04/10/13 0500  Weight: 118.6 kg (261 lb 7.5 oz) 113.5 kg (250  lb 3.6 oz) 115.2 kg (253 lb 15.5 oz)    Exam:   General:  NAD  Cardiovascular: RRR  Respiratory: CTAB  Abdomen: Soft/NT/ ND/ hypoactive BS/  Extremities: No c/c/ trace edema  Data Reviewed: Basic Metabolic Panel:  Recent Labs Lab 04/06/13 0412 04/07/13 0515 04/08/13 0510 04/09/13 0428 04/10/13 0340  NA 134* 132* 129* 135 129*  K 3.8 3.9 4.0 3.4* 3.9  CL 99 98 96 96 91*  CO2 27 24 21 27 28   GLUCOSE 84 76 232* 95 149*  BUN 26* 19 23 24* 22  CREATININE 1.23 1.17 1.15 1.25 1.09  CALCIUM 9.4 9.3 9.3 9.7 9.2  MG  --  2.2 2.1 2.2  --    Liver Function Tests: No results found for this basename: AST, ALT, ALKPHOS, BILITOT, PROT, ALBUMIN,  in the last 168 hours No results found for this basename: LIPASE, AMYLASE,  in the last 168 hours No results found for this basename: AMMONIA,  in the last 168 hours CBC:  Recent Labs Lab 04/04/13 0558 04/06/13 0412 04/07/13 0515 04/08/13 0510 04/10/13 0340  WBC 10.4 11.0* 9.4 11.5* 9.8  HGB 13.5 13.2 13.2 13.4 13.3  HCT 40.2 40.6 40.4 41.3 40.9  MCV 87.2 87.9 88.0 87.7 87.0  PLT 195 213 205 198 216   Cardiac Enzymes:  Recent Labs Lab 04/03/13 1737 04/04/13 0026 04/04/13 0558  TROPONINI <0.30 <0.30 <0.30   BNP (last 3 results)  Recent Labs  04/03/13 1204  PROBNP 2587.0*   CBG:  Recent Labs Lab 04/09/13 1149 04/09/13 1640 04/09/13 2114 04/10/13 0735 04/10/13 1133  GLUCAP 161* 143* 196* 163* 183*    Recent Results (from the past 240 hour(s))  MRSA PCR SCREENING     Status: None   Collection Time    04/03/13  5:11 PM      Result Value Range Status   MRSA by PCR NEGATIVE  NEGATIVE Final   Comment:            The GeneXpert MRSA Assay (FDA     approved for NASAL specimens     only), is one component of a     comprehensive MRSA colonization     surveillance program. It is not     intended to diagnose MRSA     infection nor to guide or     monitor treatment for     MRSA infections.  CLOSTRIDIUM  DIFFICILE BY PCR     Status: None   Collection Time    04/05/13 10:09 AM      Result Value Range Status   C difficile by pcr NEGATIVE  NEGATIVE Final     Studies: Dg Abd 2 Views  04/09/2013   *RADIOLOGY REPORT*  Clinical Data: Abdominal distension  ABDOMEN - 2 VIEW  Comparison: 04/08/2013  Findings: A  nasogastric catheter is now seen.  The stomach has been predominately decompressed.  Scattered large and small bowel gas is noted.  No free air is seen.  No other focal abnormality is noted.  IMPRESSION: Improved distension of the stomach.   Original Report Authenticated By: Alcide Clever, M.D.    Scheduled Meds: . aspirin EC  81 mg Oral Daily  . atorvastatin  40 mg Oral q1800  . citalopram  40 mg Oral Daily  . furosemide  40 mg Intravenous Once  . furosemide  40 mg Oral Daily  . insulin aspart  0-15 Units Subcutaneous TID WC  . insulin aspart  0-5 Units Subcutaneous QHS  . insulin glargine  20 Units Subcutaneous QHS  . magnesium oxide  200 mg Oral Daily  . metoprolol tartrate  25 mg Oral BID  . primidone  100 mg Oral QHS  . primidone  50 mg Oral q morning - 10a  . spironolactone  12.5 mg Oral BID  . Warfarin - Pharmacist Dosing Inpatient   Does not apply q1800   Continuous Infusions: . dextrose 100 mL/hr at 04/10/13 1610    Principal Problem:   Chest pain with minimal risk of acute coronary syndrome Active Problems:   HYPERLIPIDEMIA   HYPERTENSION   COPD- chronic O2   Chronic respiratory failure   OSA (obstructive sleep apnea)- on C-pap   CAD, multiple PCIs, last PCI Nov 2012 to his RCA for ISR. 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 Just checked by Dr Royann Shivers   Long term (current) use of anticoagulants- Coumadin   Tremor, essential    Time spent: > 35 mins    [facility]  [facility] Pager [phone]. If 7PM-7AM, please contact night-coverage at  [email or web address], password [facility] 04/10/2013, 2:45 PM  LOS: 7 days

## 2013-04-10 NOTE — Progress Notes (Signed)
ANTICOAGULATION CONSULT NOTE - Follow Up Consult  Pharmacy Consult for Warfarin Indication: atrial fibrillation  Allergies  Allergen Reactions  . Penicillins Anaphylaxis and Swelling    "swell up all over my body; throat mainly, I think" (07/13/2012)  . Chlorhexidine Gluconate Itching    Chg cloth "don't remember how bad it was" (07/13/2012)    Labs:  Recent Labs  04/08/13 0510 04/09/13 0428 04/10/13 0340  HGB 13.4  --  13.3  HCT 41.3  --  40.9  PLT 198  --  216  LABPROT 24.9* 29.0* 34.2*  INR 2.34* 2.86* 3.55*  HEPARINUNFRC 0.35  --   --   CREATININE 1.15 1.25 1.09    Estimated Creatinine Clearance: 78.5 ml/min (by C-G formula based on Cr of 1.09).   Assessment: 27 YOF presented to [facility] with CP on 9/9 and was started on heparin for unstable angina. Pt also has a h/o Afib on warfarin PTA (last dose 9/8, admit INR 2.53 on 10mg  daily except 5mg  Mon / Fri). Patient has ruled out for MI, warfarin resumed 9/11 and IV heparin continued until 9/14 when INR was back in therapeutic range.   INR 3.55, large rise in INR the past couple of days given previously high doses of Coumadin. Flex sig for chronic diarrhea showed internal hemorrhoids, otherwise wnl. Abx X-ray with significant gaseous distension of the stomach - cannot exclude GOO. Clear liquid diet started 9/15. CBC wnl, no bleeding.   Goal of Therapy:  INR 2-3 Monitor platelets by anticoagulation protocol: Yes   Plan:   No Coumadin tonight  Daily PT/INR  Pharmacy will f/u  Geoffry Paradise, PharmD, BCPS Pager: [phone] 8:44 AM Pharmacy #: [id number]

## 2013-04-10 NOTE — Progress Notes (Signed)
Sol Passer Fleener 12:14 PM  Subjective: The patient is doing better without nausea vomiting or diarrhea or abdominal pain and is tolerating clear liquids  Objective: Vital signs stable afebrile no acute distress abdomen is soft nontender labs stable biopsies pending  Assessment: Multiple medical problem I believe his diarrhea is from overflow constipation  Plan: DC NG continue clear liquids advance diet tomorrow if doing well await pathology and probably will need some MiraLax at home probably on a daily basis  [facility] E

## 2013-04-10 NOTE — Progress Notes (Signed)
RT set patient up on CPAP machine.  Patient stated that he wears a full face mask at home but was unsure of his machine settings.  RT set machine to Auto Titrate with the low pressure at 5 CMH20 and the high pressure at 20 CMH20.  Used a full face mask with a 3L oxygen bleed in.  Patient tolerating well at this time.  RT will continue to monitor.

## 2013-04-10 NOTE — Progress Notes (Signed)
Counseling Intern visited patient and patient's family to provide support. Patient was alert and "very thankful" for all the support he has received from family and the nurses. However, he also tearily mentioned that has "not had time to speak to his wife since he has been so busy with nurses and people visiting him." After the patient's family left, upon affirmation from the patient, the counselor remained to talk more about his wife and how he has been coping. Patient became teary and spoke of the "kind" qualities and "gifts" of his wife, as well as the long love they shared. Speaking for long periods of time seemed to be difficult for the patient, so upon patient's request, intern agreed to return on Friday so he could share more stories and memories of his wife. Intern provided a safe space for client's emotions, presence, and affirmation of the grieving process.

## 2013-04-11 DIAGNOSIS — E43 Unspecified severe protein-calorie malnutrition: Secondary | ICD-10-CM | POA: Insufficient documentation

## 2013-04-11 DIAGNOSIS — K5900 Constipation, unspecified: Secondary | ICD-10-CM

## 2013-04-11 DIAGNOSIS — I10 Essential (primary) hypertension: Secondary | ICD-10-CM

## 2013-04-11 DIAGNOSIS — K5909 Other constipation: Secondary | ICD-10-CM | POA: Diagnosis present

## 2013-04-11 LAB — BASIC METABOLIC PANEL
CO2: 26 mEq/L (ref 19–32)
Calcium: 9.1 mg/dL (ref 8.4–10.5)
GFR calc non Af Amer: 82 mL/min — ABNORMAL LOW (ref >90)
Glucose, Bld: 182 mg/dL — ABNORMAL HIGH (ref 70–99)
Potassium: 4 mEq/L (ref 3.5–5.1)
Sodium: 128 mEq/L — ABNORMAL LOW (ref 135–145)

## 2013-04-11 LAB — CBC
HCT: 40.2 % (ref 39.0–52.0)
Hemoglobin: 13.5 g/dL (ref 13.0–17.0)
MCHC: 33.6 g/dL (ref 30.0–36.0)
RDW: 15.3 % (ref 11.5–15.5)
WBC: 9.5 10*3/uL (ref 4.0–10.5)

## 2013-04-11 LAB — PROTIME-INR
INR: 2.94 — ABNORMAL HIGH (ref 0.00–1.49)
Prothrombin Time: 29.6 seconds — ABNORMAL HIGH (ref 11.6–15.2)

## 2013-04-11 LAB — GLUCOSE, CAPILLARY
Glucose-Capillary: 200 mg/dL — ABNORMAL HIGH (ref 70–99)
Glucose-Capillary: 194 mg/dL — ABNORMAL HIGH (ref 70–99)
Glucose-Capillary: 174 mg/dL — ABNORMAL HIGH (ref 70–99)

## 2013-04-11 MED ORDER — POLYETHYLENE GLYCOL 3350 17 G PO PACK
17.0000 g | PACK | Freq: Every day | ORAL | Status: DC
  Administered 2013-04-12: 17 g via ORAL
  Filled 2013-04-11 (×2): qty 1

## 2013-04-11 MED ORDER — ENSURE COMPLETE PO LIQD
237.0000 mL | Freq: Two times a day (BID) | ORAL | Status: DC
  Administered 2013-04-11 – 2013-04-12 (×2): 237 mL via ORAL

## 2013-04-11 MED ORDER — WARFARIN SODIUM 2.5 MG PO TABS
2.5000 mg | ORAL_TABLET | Freq: Once | ORAL | Status: AC
  Administered 2013-04-11: 2.5 mg via ORAL
  Filled 2013-04-11: qty 1

## 2013-04-11 NOTE — Progress Notes (Signed)
INITIAL NUTRITION ASSESSMENT  Pt meets criteria for severe MALNUTRITION in the context of chronic illness as evidenced by <75% estimated energy intake for the past 2 months with 7.6% weight loss in the past 2 months.  DOCUMENTATION CODES Per approved criteria  -Severe malnutrition in the context of chronic illness -Obesity Unspecified   INTERVENTION: - Ensure Complete BID - Educated pt's son on heart healthy/diabetic diet to assist pt with following - provided handouts of this information with RD contact phone number - Will continue to monitor   NUTRITION DIAGNOSIS: Unintended weight loss related to ongoing diarrhea as evidenced by pt report.   Goal: Pt to consume >90% of meals/supplements  Monitor:  Weights, labs, intake, nausea/vomiting, BM  Reason for Assessment: Nutrition risk   72 y.o. male  Admitting Dx: Chest pain with minimal risk of acute coronary syndrome  ASSESSMENT: Pt admitted with chest pain and shortness of breath, found to have unstable angina and was r/o for MI with 3 negative troponins. Pt transferred to [facility] 04/05/13 to be with pt's dying wife who was a pt here on inpatient oncology unit, who passed away.   Pt with history of 5-6 episodes of diarrhea/day for the past 2 months with ongoing nausea/vomiting and decreased appetite. Pt has lost 54 pounds unintentionally in the past 11 months. Pt reports he was eating all of the meals his wife fixed for him at home, but appetite down recently. Pt had some emesis 9/14, none reported today. Pt not on any nutritional supplements at home but was thinking about starting to drink them. Pt ate 75% of lunch tray today. Pt reports no BM in 2 days. Pt's weight down 21 pounds since admission which pt attributes to having fluid removed earlier in admission. Hard for pt to concentrate on answering questions during conversation r/t being tearful from recent death of his wife.   Height: Ht Readings from Last 1 Encounters:   04/05/13 5' 11.5" (1.816 m)    Weight: Wt Readings from Last 1 Encounters:  04/11/13 253 lb 15.5 oz (115.2 kg)    Ideal Body Weight: 172 lb   % Ideal Body Weight: 147%  Wt Readings from Last 10 Encounters:  04/11/13 253 lb 15.5 oz (115.2 kg)  04/11/13 253 lb 15.5 oz (115.2 kg)  02/05/13 274 lb (124.286 kg)  12/14/12 284 lb (128.822 kg)  12/08/12 283 lb 12.8 oz (128.731 kg)  11/27/12 278 lb (126.1 kg)  10/24/12 285 lb (129.275 kg)  07/18/12 297 lb 2.9 oz (134.8 kg)  06/09/12 307 lb 12.8 oz (139.617 kg)  12/08/11 308 lb 6.4 oz (139.889 kg)    Usual Body Weight: 307 lb in November 2013  % Usual Body Weight: 82%  BMI:  Body mass index is 34.93 kg/(m^2). Class I obestiy  Estimated Nutritional Needs: Kcal: 1950-2150 Protein: 80-95g Fluid: 1.9-2.1L/day  Skin: +1 generalized edema  Diet Order: Carb Control  EDUCATION NEEDS: -Education needs addressed - discussed healthy eating and diabetic diet with pt's son as it was hard for pt to focus on conversation    Intake/Output Summary (Last 24 hours) at 04/11/13 1143 Last data filed at 04/11/13 0612  Gross per 24 hour  Intake   3140 ml  Output   2130 ml  Net   1010 ml    Last BM: 9/15  Labs:   Recent Labs Lab 04/07/13 0515 04/08/13 0510 04/09/13 0428 04/10/13 0340 04/11/13 0345  NA 132* 129* 135 129* 128*  K 3.9 4.0 3.4* 3.9  4.0  CL 98 96 96 91* 93*  CO2 24 21 27 28 26   BUN 19 23 24* 22 15  CREATININE 1.17 1.15 1.25 1.09 0.90  CALCIUM 9.3 9.3 9.7 9.2 9.1  MG 2.2 2.1 2.2  --   --   GLUCOSE 76 232* 95 149* 182*    CBG (last 3)   Recent Labs  04/10/13 1814 04/10/13 2303 04/11/13 0741  GLUCAP 200* 194* 174*    Scheduled Meds: . aspirin EC  81 mg Oral Daily  . atorvastatin  40 mg Oral q1800  . citalopram  40 mg Oral Daily  . furosemide  40 mg Intravenous Once  . furosemide  40 mg Oral Daily  . insulin aspart  0-15 Units Subcutaneous TID WC  . insulin aspart  0-5 Units Subcutaneous QHS  .  insulin glargine  20 Units Subcutaneous QHS  . magnesium oxide  200 mg Oral Daily  . metoprolol tartrate  25 mg Oral BID  . polyethylene glycol  17 g Oral Daily  . primidone  100 mg Oral QHS  . primidone  50 mg Oral q morning - 10a  . spironolactone  12.5 mg Oral BID  . warfarin  2.5 mg Oral ONCE-1800  . Warfarin - Pharmacist Dosing Inpatient   Does not apply q1800    Continuous Infusions: . dextrose 100 mL/hr at 04/11/13 0030    Past Medical History  Diagnosis Date  . Hyperlipidemia   . Hypertension   . 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 Chronic atrial fibrillation   . Ischemic cardiomyopathy     EF 45% by echo 2010  . COPD (chronic obstructive pulmonary disease) 02/04/2010  . On home O2     "24/7"  . Renal insufficiency   . Tremor   . Angina pectoris   . Obesity, morbid 06/15/2011  . Coronary artery disease     Multiple PCIs  . Myocardial infarct ? 1995  . Blood transfusion ?2011    "hemorrhaging out my penis" (07/13/2012)  . Anemia   . Ulcer     "don't know what kind; it was years ago"  . CHF (congestive heart failure) 04/29/2009    2D Echo - EF 45-50%, normal  . Shortness of breath     "all the time" (07/13/2012)  . Obstructive sleep apnea on CPAP   . Type II diabetes mellitus   . GERD (gastroesophageal reflux disease)   . Stomach ulcer 1980's  . Arthritis     "lower back" (07/13/2012)  . Chronic lower back pain   . Hematuria ? 2011  . PAD (peripheral artery disease) 05/09/2012    Lower Arterial Exam - Right and left distal external iliac arteries-equal to or less than 50% diameter reductions, Bilateral SFA-suggestive 50-69% diameter reduction,right posterior tibial artery and left peroneal artery both occluded  . Bruit 07/23/2010    Carotid Duplex - Right and left ICAs demonstrate small amount of fibrous plaque not hemodynamically significant  . Complication of anesthesia     pt states was very difficult to wake up  . Pacemaker 2010    Past  Surgical History  Procedure Laterality Date  . Coronary stent placement      x 8  . Back surgery      x3  . Coronary angioplasty    . Coronary stent placement  10/2010    Has stents in LAD, RCA,LCX  . Coronary angioplasty with stent placement      "?  total of 9 stents over the years; 6 at one time"  . Tonsillectomy      "I was a little boy" (07/13/2012)  . Appendectomy  1954  . Cataract extraction w/ intraocular lens  implant, bilateral  ~ 2011  . Insert / replace / remove pacemaker  07/2008  . Lumbar laminectomy  1990's    "had an extra lower lumbar; had it taken out" (07/13/2012)  . Lumbar disc surgery  1990's X    "cleaned up mess from 1st then 2nd OR" (07/13/2012)  . Hemorrhoid surgery      "in the dr's office; don't know if they cut them out or banded them"  . Transesophageal echocardiogram  11/13/2004    EF 45-50%, no evidence of intracardiac mass or thrombus, does appear to be a small AST with mild left-to-right and right-to-left shunting  . Cardiac catheterization  06/16/2011    RCA 90% stenosis 3.5x2mm noncompliant Trek balloon was used for high-pressure noncompliant dilation up to approximately 3.51 proximally and 3.4 distally entire segment was reduced to less than 5%  . Cardiac catheterization  10/27/2010    95% stenosis of the distal RCA, 3x23mm bare-metal non-DES Medtronic Integrity stent to overlap stenosis, post-dilated with a 3.5x21 Pierson Sprinter balloon to 3.52mm. As balloon was pulled back, there was progressive increase in dilation to 3.64mm  . Cardiac catheterization  10/26/2010    RCA - discrete 95% proximal in-stent restenosis, mid vessel 80%discrete stenosis;   . Cardiac catheterization  07/30/2008    2x48mm angioscope balloon cuts 3 to 5 atmospheric pressure  . Flexible sigmoidoscopy N/A 04/09/2013    Procedure: FLEXIBLE SIGMOIDOSCOPY;  Surgeon: Petra Kuba, MD;  Location: [facility];  Service: Endoscopy;  Laterality: N/A;    Levon Hedger MS, RD, LDN [phone]  Pager [phone] After Hours Pager

## 2013-04-11 NOTE — Progress Notes (Addendum)
[facility] PROGRESS NOTE  Bruce Taylor NFA:[id number] DOB: 06/17/1941 DOA: 04/03/2013 PCP: Daisy Floro, MD  HPI/Brief narrative 72 year old male patient with history of CAD status post multiple stents, COPD, chronic respiratory failure on home oxygen 2-3 L per minute, OSA on CPAP, chronic A. fib on Coumadin, permanent pacemaker for bradycardia, tremor right arm >left (not felt to be Parkinsonian), morbidly obese, chronic urinary incontinence was initially admitted by the cardiology service on 04/03/13 for atypical chest pain. Nuclear stress test on 9/10 did not show significant perfusion defect in EF was 62%. [facility] was consulted on 9/12 for evaluation of chronic & persistent diarrhea. Patient had rectal tube placed. GI consulted and patient underwent sigmoidoscopy and biopsy on 9/15 which showed chronic constipation and overflow diarrhea. Since then patient's diet is being gradually advanced. [facility] took over primary care on 9/14.  Assessment/Plan:  Constipation with overflow incontinence/rectal and distal sigmoid stercoral ulcers - Patient had chronic constipation for almost 2 months. In the hospital, he developed nausea vomiting and worsening diarrhea. - NG tube and rectal tube were placed. - GI consulted and performed a sigmoidoscopy with findings as above. - NG tube was removed and diet is being gradually advanced.  Atypical chest pain/CAD/chronic A. Fib/PPM - Myoview on 9/10 low risk. - Continue medical management-metoprolol and Coumadin per cardiology - Cardiology has signed off.  Hypertension - Controlled. Continue current medications.  Type II DM - Hemoglobin A1c 8.3 - Fluctuating and mildly uncontrolled. Continue Lantus and SSI.  Hyponatremia - Stable - Clinically euvolemic.? SIADH.  COPD/O2 dependent chronic respiratory failure/OSA - Stable. Continue oxygen and nightly CPAP.  Tremors right upper extremity > left. - Continue primidone  Deconditioning - SNF  on discharge   DVT prophylaxis: Coumadin anticoagulation Lines/catheters: PIV Nutrition: On clear liquids-advancing to diabetic diet.  Activity:  Up with assistance/PT evaluation Code Status: Full Family Communication: None Disposition Plan: SNF when medically stable.   Consultants:  Gastroenterology  Cardiology  Procedures:  Rectal tube-DC'd  NG tube-DC'd  Flexible sigmoidoscopy 04/09/13  Antibiotics:  None   Subjective: Denies complaints. No abdominal pain, nausea or vomiting. Denies dyspnea or chest pain. Last BM 9/15.  Objective: Filed Vitals:   04/10/13 1440 04/10/13 2303 04/11/13 0607 04/11/13 1430  BP: 111/78 148/68 139/74 116/62  Pulse: 55 55 56 55  Temp: 97.8 F (36.6 C) 97.5 F (36.4 C) 96.8 F (36 C) 97.9 F (36.6 C)  TempSrc: Oral Oral Axillary Oral  Resp: 18 20 16 20   Height:      Weight:   115.2 kg (253 lb 15.5 oz)   SpO2: 95% 97% 94% 95%    Intake/Output Summary (Last 24 hours) at 04/11/13 1628 Last data filed at 04/11/13 1417  Gross per 24 hour  Intake   2340 ml  Output   2380 ml  Net    -40 ml   Filed Weights   04/09/13 0500 04/10/13 0500 04/11/13 0607  Weight: 113.5 kg (250 lb 3.6 oz) 115.2 kg (253 lb 15.5 oz) 115.2 kg (253 lb 15.5 oz)     Exam:  General exam: Moderately built and morbidly obese male lying comfortably supine in bed. Respiratory system: Slightly reduced breath sounds in the bases but otherwise clear to auscultation. No increased work of breathing. Cardiovascular system: S1 & S2 heard, RRR. No JVD, murmurs, gallops, clicks or pedal edema. Gastrointestinal system: Abdomen is nondistended, soft and nontender. Normal bowel sounds heard. Central nervous system: Alert and oriented. No focal neurological deficits. Extremities: Symmetric 5 x  5 power. Tremors right upper extremity.   Data Reviewed: Basic Metabolic Panel:  Recent Labs Lab 04/07/13 0515 04/08/13 0510 04/09/13 0428 04/10/13 0340 04/11/13 0345  NA  132* 129* 135 129* 128*  K 3.9 4.0 3.4* 3.9 4.0  CL 98 96 96 91* 93*  CO2 24 21 27 28 26   GLUCOSE 76 232* 95 149* 182*  BUN 19 23 24* 22 15  CREATININE 1.17 1.15 1.25 1.09 0.90  CALCIUM 9.3 9.3 9.7 9.2 9.1  MG 2.2 2.1 2.2  --   --    Liver Function Tests: No results found for this basename: AST, ALT, ALKPHOS, BILITOT, PROT, ALBUMIN,  in the last 168 hours No results found for this basename: LIPASE, AMYLASE,  in the last 168 hours No results found for this basename: AMMONIA,  in the last 168 hours CBC:  Recent Labs Lab 04/06/13 0412 04/07/13 0515 04/08/13 0510 04/10/13 0340 04/11/13 0345  WBC 11.0* 9.4 11.5* 9.8 9.5  HGB 13.2 13.2 13.4 13.3 13.5  HCT 40.6 40.4 41.3 40.9 40.2  MCV 87.9 88.0 87.7 87.0 85.9  PLT 213 205 198 216 203   Cardiac Enzymes: No results found for this basename: CKTOTAL, CKMB, CKMBINDEX, TROPONINI,  in the last 168 hours BNP (last 3 results)  Recent Labs  04/03/13 1204  PROBNP 2587.0*   CBG:  Recent Labs Lab 04/10/13 1133 04/10/13 1814 04/10/13 2303 04/11/13 0741 04/11/13 1208  GLUCAP 183* 200* 194* 174* 113*    Recent Results (from the past 240 hour(s))  MRSA PCR SCREENING     Status: None   Collection Time    04/03/13  5:11 PM      Result Value Range Status   MRSA by PCR NEGATIVE  NEGATIVE Final   Comment:            The GeneXpert MRSA Assay (FDA     approved for NASAL specimens     only), is one component of a     comprehensive MRSA colonization     surveillance program. It is not     intended to diagnose MRSA     infection nor to guide or     monitor treatment for     MRSA infections.  CLOSTRIDIUM DIFFICILE BY PCR     Status: None   Collection Time    04/05/13 10:09 AM      Result Value Range Status   C difficile by pcr NEGATIVE  NEGATIVE Final      Additional labs: 1. INR: 2.94 on 9/17 2. TSH: 1.885 3. Fecal lactoferrin: Negative 4. GI pathogen panel: Negative 5. FOBT: Negative 6. Flexible sigmoidoscopy 9/15:  ENDOSCOPIC IMPRESSION:1. Tiny internal hemorrhoids only 2. few shallow rectal and distal sigmoid erosions status post probably stercoral ulcers from chronic constipation and overflow diarrhea 3. otherwise within normal limits to probably the mid descending status post a few normal biopsies 7. Pathology report: Diagnosis  1. Colon, biopsy, normal  - UNREMARKABLE COLONIC MUCOSA.  - NO MICROSCOPIC COLITIS, ACTIVE INFLAMMATION OR GRANULOMAS.  2. Colon, biopsy, recto sigmoid colon ulceration  - EROSION WITH HYPEREMIA. SEE MICROSCOPIC DESCRIPTION.     Studies: No results found.      Scheduled Meds: . aspirin EC  81 mg Oral Daily  . atorvastatin  40 mg Oral q1800  . citalopram  40 mg Oral Daily  . feeding supplement  237 mL Oral BID BM  . furosemide  40 mg Intravenous Once  . furosemide  40 mg Oral  Daily  . insulin aspart  0-15 Units Subcutaneous TID WC  . insulin aspart  0-5 Units Subcutaneous QHS  . insulin glargine  20 Units Subcutaneous QHS  . magnesium oxide  200 mg Oral Daily  . metoprolol tartrate  25 mg Oral BID  . polyethylene glycol  17 g Oral Daily  . primidone  100 mg Oral QHS  . primidone  50 mg Oral q morning - 10a  . spironolactone  12.5 mg Oral BID  . warfarin  2.5 mg Oral ONCE-1800  . Warfarin - Pharmacist Dosing Inpatient   Does not apply q1800   Continuous Infusions: . dextrose 100 mL/hr at 04/11/13 1230    Principal Problem:   Chest pain with minimal risk of acute coronary syndrome Active Problems:   HYPERLIPIDEMIA   HYPERTENSION   COPD- chronic O2   Chronic respiratory failure   OSA (obstructive sleep apnea)- on C-pap   CAD, multiple PCIs, last PCI Nov 2012 to his RCA for ISR. 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 Just checked by Dr Royann Shivers   Long term (current) use of anticoagulants- Coumadin   Tremor, essential   Protein-calorie  malnutrition, severe    Time spent: 25 minutes.    [facility]  [facility] Pager [phone].   If 8PM-8AM, please contact night-coverage at [email or web address], password [facility] 04/11/2013, 4:28 PM  LOS: 8 days

## 2013-04-11 NOTE — Progress Notes (Signed)
ANTICOAGULATION CONSULT NOTE - Follow Up Consult  Pharmacy Consult for Warfarin Indication: atrial fibrillation  Allergies  Allergen Reactions  . Penicillins Anaphylaxis and Swelling    "swell up all over my body; throat mainly, I think" (07/13/2012)  . Chlorhexidine Gluconate Itching    Chg cloth "don't remember how bad it was" (07/13/2012)    Labs:  Recent Labs  04/09/13 0428 04/10/13 0340 04/11/13 0345  HGB  --  13.3 13.5  HCT  --  40.9 40.2  PLT  --  216 203  LABPROT 29.0* 34.2* 29.6*  INR 2.86* 3.55* 2.94*  CREATININE 1.25 1.09 0.90    Estimated Creatinine Clearance: 95.1 ml/min (by C-G formula based on Cr of 0.9).   Assessment: 78 YOF presented to [facility] with CP on 9/9 and was started on heparin for unstable angina. Pt also has a h/o Afib on warfarin PTA (last dose 9/8, admit INR 2.53 on 10mg  daily except 5mg  Mon / Fri). Patient has ruled out for MI, warfarin resumed 9/11 and IV heparin continued until 9/14 when INR was back in therapeutic range.   Coumadin was on hold last night given INR of 3.55.  INR is 2.94 today with plan tp advance diet. CBC okay.   Goal of Therapy:  INR 2-3 Monitor platelets by anticoagulation protocol: Yes   Plan:   Coumadin 2.5 mg tonight  Daily PT/INR  Pharmacy will f/u  Geoffry Paradise, PharmD, BCPS Pager: [phone] 8:57 AM Pharmacy #: [phone]

## 2013-04-11 NOTE — Progress Notes (Signed)
Bruce Taylor 10:47 AM  Subjective: The patient is doing better and tolerating clear liquids and no new complaints  Objective: Vital signs stable afebrile abdomen is soft nontender labs stable biopsies compatible with either an ischemia or drug effect but my guess is he has ischemia from pressure necrosis and constipation  Assessment: Improved  Plan: Will slowly advance diet and continue MiraLax and happy see back as an outpatient and expect the patient will need rehabilitation or nursing home placement  [facility] E

## 2013-04-11 NOTE — Evaluation (Signed)
Physical Therapy Evaluation Patient Details Name: Bruce Taylor MRN: [id number] DOB: May 29, 1941 Today's Date: 04/11/2013 Time: 1191-4782 PT Time Calculation (min): 20 min  PT Assessment / Plan / Recommendation History of Present Illness   72 y/o former patient of Dr. Caprice Kluver who has established coronary artery disease. In the past, he has undergone multiple interventions and has stents placed in his LAD, diagonal and circumflex marginal vessel, as well as in the proximal to mid right coronary artery.  Pt admitted 04/03/13 with atypical chest pain and is being treated for chronic constipation/diarrhea.  During this hospitalization, his wife passed away and pt is actively grieving. He is now referred to PT for mobility assessment  Clinical Impression  Pt has generalized weakness and deconditioning.  He will benefit from continued PT at SNF level to decrease burden of care prior to d/c to home with familiy     PT Assessment  Patient needs continued PT services    Follow Up Recommendations  SNF    Does the patient have the potential to tolerate intense rehabilitation      Barriers to Discharge Decreased caregiver support      Equipment Recommendations  None recommended by PT    Recommendations for Other Services OT consult   Frequency Min 3X/week    Precautions / Restrictions     Pertinent Vitals/Pain Pt c/o pain, but does have  weakness and is incontinent of stool.      Mobility  Bed Mobility Bed Mobility: Rolling Right;Rolling Left Rolling Right: 2: Max assist Rolling Left: 2: Max assist Transfers Transfers: Not assessed Details for Transfer Assistance: per pt he  had difficutly with mobility with nurses this am and defers this part of eval until tomorrow Ambulation/Gait Ambulation/Gait Assistance: Not tested (comment)    Exercises General Exercises - Lower Extremity Ankle Circles/Pumps: AAROM;Both;10 reps;Supine Short Arc Quad: AROM;Both;15 reps;Supine Hip  Flexion/Marching: AAROM;Both;10 reps;Supine   PT Diagnosis: Difficulty walking;Generalized weakness  PT Problem List: Decreased strength;Decreased activity tolerance;Decreased balance;Decreased mobility;Decreased safety awareness;Obesity PT Treatment Interventions: DME instruction;Gait training;Functional mobility training;Therapeutic activities;Therapeutic exercise;Balance training;Patient/family education     PT Goals(Current goals can be found in the care plan section) Acute Rehab PT Goals Patient Stated Goal: to go to [facility] to get stronger PT Goal Formulation: With patient Time For Goal Achievement: 04/25/13 Potential to Achieve Goals: Good  Visit Information  Last PT Received On: 04/11/13 Assistance Needed: +2 History of Present Illness:  72 y/o former patient of Dr. Caprice Kluver who has established coronary artery disease. In the past, he has undergone multiple interventions and has stents placed in his LAD, diagonal and circumflex marginal vessel, as well as in the proximal to mid right coronary artery.  Pt admitted 04/03/13 with atypical chest pain and is being treated for chronic constipation/diarrhea.  During this hospitalization, his wife passed away and pt is actively grieving. He is now referred to PT for mobility assessment       Prior Functioning  Home Living Family/patient expects to be discharged to:: Skilled nursing facility (pt wants to go to [facility])    Cognition  Cognition Arousal/Alertness: Awake/alert Behavior During Therapy: WFL for tasks assessed/performed Overall Cognitive Status: Within Functional Limits for tasks assessed    Extremity/Trunk Assessment Upper Extremity Assessment Upper Extremity Assessment: Defer to OT evaluation Lower Extremity Assessment Lower Extremity Assessment: Generalized weakness Cervical / Trunk Assessment Cervical / Trunk Assessment: Other exceptions Cervical / Trunk Exceptions: obesity   Balance    End of Session  PT  - End of Session Activity Tolerance: Patient limited by fatigue;Other (comment) (limited by weakness) Patient left: in bed;with nursing/sitter in room;with call bell/phone within reach Nurse Communication: Mobility status  GP    Rosey Bath K. Manson Passey, [place] [phone] 04/11/2013, 3:33 PM

## 2013-04-11 NOTE — Progress Notes (Signed)
CSW continues to follow for discharge planning, patient has a bed at [facility] when medically stable. Note patient was advanced from a clear liquid diet to carb modified. CSW has completed FL2 & will continue to follow and assist with discharge. Will check back in the morning.    Unice Bailey, LCSW [facility] Clinical Social Worker cell #: [phone]

## 2013-04-12 LAB — BASIC METABOLIC PANEL
BUN: 18 mg/dL (ref 6–23)
Calcium: 9 mg/dL (ref 8.4–10.5)
Creatinine, Ser: 1.03 mg/dL (ref 0.50–1.35)
GFR calc Af Amer: 81 mL/min — ABNORMAL LOW (ref >90)
GFR calc non Af Amer: 70 mL/min — ABNORMAL LOW (ref >90)
Glucose, Bld: 142 mg/dL — ABNORMAL HIGH (ref 70–99)
Potassium: 3.9 mEq/L (ref 3.5–5.1)

## 2013-04-12 LAB — GLUCOSE, CAPILLARY
Glucose-Capillary: 121 mg/dL — ABNORMAL HIGH (ref 70–99)
Glucose-Capillary: 206 mg/dL — ABNORMAL HIGH (ref 70–99)

## 2013-04-12 MED ORDER — ALBUTEROL SULFATE (5 MG/ML) 0.5% IN NEBU: 2.5000 mg | INHALATION_SOLUTION | RESPIRATORY_TRACT | Status: AC | PRN

## 2013-04-12 MED ORDER — INSULIN NPH ISOPHANE & REGULAR (70-30) 100 UNIT/ML ~~LOC~~ SUSP: 15.0000 [IU] | Freq: Two times a day (BID) | SUBCUTANEOUS | Status: AC

## 2013-04-12 MED ORDER — WARFARIN SODIUM 10 MG PO TABS
10.0000 mg | ORAL_TABLET | Freq: Once | ORAL | Status: AC
  Administered 2013-04-12: 12:00:00 10 mg via ORAL
  Filled 2013-04-12: qty 1

## 2013-04-12 MED ORDER — BUDESONIDE 0.5 MG/2ML IN SUSP: 0.5000 mg | Freq: Two times a day (BID) | RESPIRATORY_TRACT | Status: DC

## 2013-04-12 MED ORDER — ENSURE COMPLETE PO LIQD: 237.0000 mL | Freq: Two times a day (BID) | ORAL | Status: DC

## 2013-04-12 MED ORDER — POLYETHYLENE GLYCOL 3350 17 G PO PACK: 17.0000 g | PACK | Freq: Every day | ORAL | Status: DC

## 2013-04-12 NOTE — Progress Notes (Signed)
Sol Passer Sao 10:40 AM  Subjective: Patient doing well tolerating regular diet without GI complaints but no bowel movement and we rediscussed constipation and how it can present with seemingly diarrhea  Objective: Vital signs stable afebrile no acute distress abdomen is soft nontender good bowel sounds  Assessment: Multiple medical problems including chronic constipation  Plan: MiraLax one or 2 times a day long-term happy to see back when necessary and happy to follow up with me in a few weeks to a month in the office  [facility] E

## 2013-04-12 NOTE — Discharge Summary (Addendum)
Physician Discharge Summary  Lewayne Pauley Patino MWU:[id number] DOB: June 26, 1941 DOA: 04/03/2013  PCP: Daisy Floro, MD  Admit date: 04/03/2013 Discharge date: 04/12/2013  Time spent: Greater than 30 minutes  Recommendations for Outpatient Follow-up:  1. Dr. Daisy Floro, PCP in 2 weeks 2. Repeat labs (PT, INR & BMP) on 04/16/13 and M.D. at skilled nursing facility to adjust Coumadin dose as needed. 3. Dr. Nicki Guadalajara, Cardiology 4. Dr. Vida Rigger, GI in 1 month. 5. Oxygen via nasal cannula at 2 L per minute continuously. 6. Nightly CPAP  Discharge Diagnoses:  Principal Problem:   Chest pain with minimal risk of acute coronary syndrome Active Problems:   HYPERLIPIDEMIA   HYPERTENSION   COPD- chronic O2   Chronic respiratory failure   OSA (obstructive sleep apnea)- on C-pap   CAD, multiple PCIs, last PCI Nov 2012 to his RCA for ISR. 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 Just checked by Dr Royann Shivers   Long term (current) use of anticoagulants- Coumadin   Tremor, essential   Protein-calorie malnutrition, severe   Chronic constipation with overflow incontinence   Discharge Condition: Improved & Stable  Diet recommendation: Heart healthy and diabetic diet.  Filed Weights   04/10/13 0500 04/11/13 0607 04/12/13 0457  Weight: 115.2 kg (253 lb 15.5 oz) 115.2 kg (253 lb 15.5 oz) 114.6 kg (252 lb 10.4 oz)    History of present illness:  72 year old male patient with history of CAD status post multiple stents, COPD, chronic respiratory failure on home oxygen 2-3 L per minute, OSA on CPAP, chronic A. fib on Coumadin, permanent pacemaker for bradycardia, tremor right arm >left (not felt to be Parkinsonian), morbidly obese, chronic urinary incontinence was initially admitted by the cardiology service on 04/03/13 for atypical chest pain. Nuclear stress test on 9/10 did not show  significant perfusion defect in EF was 62%. [facility] was consulted on 9/12 for evaluation of chronic & persistent diarrhea. Patient had rectal tube placed. GI consulted and patient underwent sigmoidoscopy and biopsy on 9/15 which showed chronic constipation and overflow diarrhea. Since then patient's diet is being gradually advanced. [facility] took over primary care on 9/14.  Hospital Course:   Constipation with overflow incontinence/rectal and distal sigmoid stercoral ulcers  - Patient had chronic constipation for almost 2 months. In the hospital, he developed nausea vomiting and worsening diarrhea.  - NG tube and rectal tube were placed.  - GI consulted and performed a sigmoidoscopy with findings as above.  - NG tube was removed and diet is being gradually advanced.  - Patient has tolerated regular consistency diet. - May consider increasing MiraLAX to twice a day if patient is not having regular BMs.  Atypical chest pain/CAD/chronic A. Fib/PPM  - Myoview on 9/10 low risk.  - Continue medical management-metoprolol and Coumadin, per cardiology  - Cardiology has signed off.  - No further chest pains.  Hypertension  - Controlled. Continue current medications.   Type II DM  - Hemoglobin A1c 8.3  - Fluctuating and mildly uncontrolled.  - Since patient's oral intake is still poor, will reduce home dose of 70/30 insulin which can be titrated up at SNF based on his CBGs.  Hyponatremia  - Stable  - Clinically euvolemic.? SIADH.  - Follow BMP in a few days.  COPD/O2 dependent chronic respiratory failure/OSA  - Stable. Continue oxygen and nightly CPAP.  Tremors right upper extremity > left.  - Continue primidone   Deconditioning  - SNF on discharge   Consultants:  Gastroenterology  Cardiology  Procedures:  Rectal tube-DC'd  NG tube-DC'd  Flexible sigmoidoscopy 04/09/13   Discharge Exam:  Complaints:  Denies complaints. No nausea, vomiting, abdominal pain, chest pain or dyspnea. As  per nursing, had 2 loose BMs yesterday but none overnight. Tolerating regular consistency diet.  Filed Vitals:   04/11/13 2054 04/11/13 2136 04/12/13 0453 04/12/13 0457  BP:  119/51 126/64   Pulse: 55 55 57   Temp:  98.2 F (36.8 C) 99.9 F (37.7 C)   TempSrc:  Oral Axillary   Resp: 18 22 16    Height:      Weight:    114.6 kg (252 lb 10.4 oz)  SpO2: 92% 91% 94%     General exam: Moderately built and morbidly obese male lying comfortably supine in bed.  Respiratory system: Slightly reduced breath sounds in the bases but otherwise clear to auscultation. No increased work of breathing.  Cardiovascular system: S1 & S2 heard, RRR. No JVD, murmurs, gallops, clicks or pedal edema.  Gastrointestinal system: Abdomen is nondistended, soft and nontender. Normal bowel sounds heard.  Central nervous system: Alert and oriented. No focal neurological deficits.  Extremities: Symmetric 5 x 5 power. Tremors right upper extremity.   Discharge Instructions      Discharge Orders   Future Appointments Provider Department Dept Phone   06/13/2013 10:30 AM Barbaraann Share, MD [facility] [phone]   Future Orders Complete By Expires   (HEART FAILURE PATIENTS) Call MD:  Anytime you have any of the following symptoms: 1) 3 pound weight gain in 24 hours or 5 pounds in 1 week 2) shortness of breath, with or without a dry hacking cough 3) swelling in the hands, feet or stomach 4) if you have to sleep on extra pillows at night in order to breathe.  As directed    Call MD for:  difficulty breathing, headache or visual disturbances  As directed    Call MD for:  extreme fatigue  As directed    Call MD for:  persistant dizziness or light-headedness  As directed    Call MD for:  persistant nausea and vomiting  As directed    Call MD for:  severe uncontrolled pain  As directed    Call MD for:  As directed    Comments:     Worsening diarrhea.   Diet - low sodium heart healthy  As directed    Diet  Carb Modified  As directed    Discharge instructions  As directed    Comments:     1. continue oxygen via nasal cannula at 2 L per minute continuously. 2. continue nightly CPAP.   Increase activity slowly  As directed        Medication List    STOP taking these medications       albuterol (2.5 MG/3ML) 0.083% nebulizer solution  Commonly known as:  PROVENTIL  Replaced by:  albuterol (5 MG/ML) 0.5% nebulizer solution     diphenoxylate-atropine 2.5-0.025 MG per tablet  Commonly known as:  LOMOTIL      TAKE these medications       aspirin EC 81 MG tablet  Take 81 mg by mouth daily.     budesonide 0.5 MG/2ML nebulizer solution  Commonly known as:  PULMICORT  Take 2 mLs (0.5 mg total) by nebulization 2 (two) times daily.  citalopram 40 MG tablet  Commonly known as:  CELEXA  Take 40 mg by mouth daily.     feeding supplement Liqd  Take 237 mLs by mouth 2 (two) times daily between meals.     furosemide 40 MG tablet  Commonly known as:  LASIX  Take 40 mg by mouth daily.     insulin NPH-regular (70-30) 100 UNIT/ML injection  Commonly known as:  RELION 70/30  Inject 15 Units into the skin 2 (two) times daily with a meal.     isosorbide mononitrate 30 MG 24 hr tablet  Commonly known as:  IMDUR  Take 30 mg by mouth daily.     Magnesium 250 MG Tabs  Take 250 mg by mouth daily.     metoprolol tartrate 25 MG tablet  Commonly known as:  LOPRESSOR  Take 1 tablet (25 mg total) by mouth 2 (two) times daily.     nitroGLYCERIN 0.4 MG SL tablet  Commonly known as:  NITROSTAT  Place 0.4 mg under the tongue every 5 (five) minutes as needed. For chest pain     oxybutynin 5 MG tablet  Commonly known as:  DITROPAN  Take 5 mg by mouth 3 (three) times daily as needed (for urinary incontinence).     polyethylene glycol packet  Commonly known as:  MIRALAX / GLYCOLAX  Take 17 g by mouth daily.     primidone 50 MG tablet  Commonly known as:  MYSOLINE  Take 1-2 tablets (50-100 mg  total) by mouth 2 (two) times daily. 1 tab in the morning and 2 tabs at night     PROAIR HFA 108 (90 BASE) MCG/ACT inhaler  Generic drug:  albuterol  Inhale 1-2 puffs into the lungs every 6 (six) hours as needed. For shortness of breath     albuterol (5 MG/ML) 0.5% nebulizer solution  Commonly known as:  PROVENTIL  Take 0.5 mLs (2.5 mg total) by nebulization every 4 (four) hours as needed for wheezing or shortness of breath.     simvastatin 80 MG tablet  Commonly known as:  ZOCOR  Take 40 mg by mouth at bedtime.     spironolactone 25 MG tablet  Commonly known as:  ALDACTONE  Take 12.5 mg by mouth 2 (two) times daily.     warfarin 10 MG tablet  Commonly known as:  COUMADIN  Take 5-10 mg by mouth daily. Takes 10mg  (1 tab) on Tuesday, Wednesday, Thursday, Saturday, Sunday, 5 mg mon, fri       Follow-up Information   Follow up with Daisy Floro, MD. Schedule an appointment as soon as possible for a visit in 2 weeks.   Specialty:  Family Medicine   Contact information:   [address] [place] [address] [phone]       Follow up with MD at Skilled Nursing Facility. (Follow repeat labs (PT, INR & BMP) on 04/16/13 and adjust Coumadin dose as needed.)       Schedule an appointment as soon as possible for a visit with Lennette Bihari, MD.   Specialty:  Cardiology   Contact information:   [address] [place] [address] [phone]       Follow up with [facility], MD. Schedule an appointment as soon as possible for a visit in 1 month.   Specialty:  Gastroenterology   Contact information:   [address]  [place] [address] [phone]        The results of significant diagnostics from this hospitalization (including imaging, microbiology, ancillary and laboratory) are listed below for reference.    Significant Diagnostic Studies: Dg Chest 2 View  04/03/2013   *RADIOLOGY REPORT*   Clinical Data: Chest pain and shortness of breath.  Hypertension.  CHEST - 2 VIEW  Comparison: 07/13/2012  Findings: The cardiac silhouette is mildly enlarged.  No mediastinal or hilar masses are appreciated.  There is central vascular congestion with mild interstitial thickening and minimal pleural effusions.  The combination is consistent with congestive heart failure.  IMPRESSION: Mild congestive heart failure.   Original Report Authenticated By: Amie Portland, M.D.   Ct Head Wo Contrast  04/04/2013   *RADIOLOGY REPORT*  Clinical Data: Agitation and confusion.  Change in mental status.  CT HEAD WITHOUT CONTRAST  Technique:  Contiguous axial images were obtained from the base of the skull through the vertex without contrast.  Comparison: None.  Findings: Mild diffuse cerebral atrophy.  Mild ventricular dilatation consistent with central atrophy.  Low attenuation changes in the deep white matter consistent with small vessel ischemia. Focal low attenuation changes extending to the cortex in the right frontal region could represent early acute infarct in the region of the anterior middle cerebral artery distribution.  No mass effect or midline shift.  No abnormal extra-axial fluid collections.  Gray-white matter junctions are distinct.  Basal cisterns are not effaced. No depressed skull fractures.  Visualized paranasal sinuses and mastoid air cells are not opacified.  Minimal mucosal thickening in the sphenoid sinus.  Vascular calcifications.  IMPRESSION: Vague low attenuation changes in the right frontal lobe may represent early changes of acute infarct.  No significant mass effect.  No acute intracranial hemorrhage.  Chronic atrophy and small vessel ischemic changes.   Original Report Authenticated By: Burman Nieves, M.D.   Nm Myocar Multi W/spect W/wall Motion / Ef  04/04/2013   CLINICAL DATA:  Chest pain  EXAM: MYOCARDIAL IMAGING WITH SPECT (REST AND PHARMACOLOGIC-STRESS)  GATED LEFT VENTRICULAR WALL  MOTION STUDY  LEFT VENTRICULAR EJECTION FRACTION  TECHNIQUE: Standard myocardial SPECT imaging was performed after resting intravenous injection of 10 mCi Tc-46m sestamibi Subsequently, intravenous infusion of Lexiscan was performed under the supervision of the Cardiology staff. At peak effect of the drug, 30 mCi Tc-72m sestamibi was injected intravenously and standard myocardial SPECT imaging was performed. Quantitative gated imaging was also performed to evaluate left ventricular wall motion, and estimate left ventricular ejection fraction.  COMPARISON:  None.  FINDINGS: Myocardial perfusion study:No significant perfusion defect to suggest scar or inducible ischemia.  Ejection fraction calculation: End-diastolic volume is 104 mL. End systolic volume is 40 mL. Derived left ventricular ejection fraction of 62%.  Wall motion analysis: Hypokinetic septal wall. Otherwise normal.  IMPRESSION: 1. Hypokinetic septal wall. 2. No inducible ischemia. Ejection fraction calculated at 62%.   Electronically Signed   By: Herbie Baltimore   On: 04/04/2013 14:12   Dg Abd 2 Views  04/09/2013   *RADIOLOGY REPORT*  Clinical Data: Abdominal distension  ABDOMEN - 2 VIEW  Comparison: 04/08/2013  Findings: A  nasogastric catheter is now seen.  The stomach has been predominately decompressed.  Scattered large and small bowel gas is noted.  No free air is seen.  No other focal abnormality is noted.  IMPRESSION: Improved distension of the stomach.   Original Report Authenticated By: Alcide Clever, M.D.   Dg Abd 2 Views  04/08/2013  CLINICAL DATA:  Abdominal pain, bloating  EXAM: ABDOMEN - 2 VIEW  COMPARISON:  10/24/2012  FINDINGS: There is significant gaseous distension of the stomach. Gastroparesis or gastric outlet obstruction cannot be excluded. Mild gaseous distension of the colon. No free abdominal air.  IMPRESSION: Significant gaseous distension of the stomach. Gastroparesis or gastric outlet obstruction cannot be excluded. No  free abdominal air.   Electronically Signed   By: Natasha Mead   On: 04/08/2013 09:14    Microbiology: Recent Results (from the past 240 hour(s))  MRSA PCR SCREENING     Status: None   Collection Time    04/03/13  5:11 PM      Result Value Range Status   MRSA by PCR NEGATIVE  NEGATIVE Final   Comment:            The GeneXpert MRSA Assay (FDA     approved for NASAL specimens     only), is one component of a     comprehensive MRSA colonization     surveillance program. It is not     intended to diagnose MRSA     infection nor to guide or     monitor treatment for     MRSA infections.  CLOSTRIDIUM DIFFICILE BY PCR     Status: None   Collection Time    04/05/13 10:09 AM      Result Value Range Status   C difficile by pcr NEGATIVE  NEGATIVE Final     Labs: Basic Metabolic Panel:  Recent Labs Lab 04/07/13 0515 04/08/13 0510 04/09/13 0428 04/10/13 0340 04/11/13 0345 04/12/13 0445  NA 132* 129* 135 129* 128* 130*  K 3.9 4.0 3.4* 3.9 4.0 3.9  CL 98 96 96 91* 93* 93*  CO2 24 21 27 28 26 27   GLUCOSE 76 232* 95 149* 182* 142*  BUN 19 23 24* 22 15 18   CREATININE 1.17 1.15 1.25 1.09 0.90 1.03  CALCIUM 9.3 9.3 9.7 9.2 9.1 9.0  MG 2.2 2.1 2.2  --   --   --    Liver Function Tests: No results found for this basename: AST, ALT, ALKPHOS, BILITOT, PROT, ALBUMIN,  in the last 168 hours No results found for this basename: LIPASE, AMYLASE,  in the last 168 hours No results found for this basename: AMMONIA,  in the last 168 hours CBC:  Recent Labs Lab 04/06/13 0412 04/07/13 0515 04/08/13 0510 04/10/13 0340 04/11/13 0345  WBC 11.0* 9.4 11.5* 9.8 9.5  HGB 13.2 13.2 13.4 13.3 13.5  HCT 40.6 40.4 41.3 40.9 40.2  MCV 87.9 88.0 87.7 87.0 85.9  PLT 213 205 198 216 203   Cardiac Enzymes: No results found for this basename: CKTOTAL, CKMB, CKMBINDEX, TROPONINI,  in the last 168 hours BNP: BNP (last 3 results)  Recent Labs  04/03/13 1204  PROBNP 2587.0*   CBG:  Recent  Labs Lab 04/11/13 0741 04/11/13 1208 04/11/13 1650 04/11/13 2134 04/12/13 0745  GLUCAP 174* 113* 270* 180* 121*    Additional labs:  1. INR: 2.94 on 9/17 2. TSH: 1.885 3. Fecal lactoferrin: Negative 4. GI pathogen panel: Negative 5. FOBT: Negative 6. Flexible sigmoidoscopy 9/15: ENDOSCOPIC IMPRESSION:1. Tiny internal hemorrhoids only 2. few shallow rectal and distal sigmoid erosions status post probably stercoral ulcers from chronic constipation and overflow diarrhea 3. otherwise within normal limits to probably the mid descending status post a few normal biopsies 7. Pathology report: Diagnosis 1. Colon, biopsy, normal  - UNREMARKABLE COLONIC MUCOSA.  -  NO MICROSCOPIC COLITIS, ACTIVE INFLAMMATION OR GRANULOMAS.  2. Colon, biopsy, recto sigmoid colon ulceration  - EROSION WITH HYPEREMIA. SEE MICROSCOPIC DESCRIPTION.  Discussed with daughter at bedside.  Signed:  Linn Goetze  [facility] 04/12/2013, 10:52 AM

## 2013-04-12 NOTE — Progress Notes (Addendum)
ANTICOAGULATION CONSULT NOTE - Follow Up Consult  Pharmacy Consult for Warfarin Indication: atrial fibrillation  Allergies  Allergen Reactions  . Penicillins Anaphylaxis and Swelling    "swell up all over my body; throat mainly, I think" (07/13/2012)  . Chlorhexidine Gluconate Itching    Chg cloth "don't remember how bad it was" (07/13/2012)    Labs:  Recent Labs  04/10/13 0340 04/11/13 0345 04/12/13 0445  HGB 13.3 13.5  --   HCT 40.9 40.2  --   PLT 216 203  --   LABPROT 34.2* 29.6* 21.7*  INR 3.55* 2.94* 1.96*  CREATININE 1.09 0.90 1.03    Estimated Creatinine Clearance: 82.8 ml/min (by C-G formula based on Cr of 1.03).   Assessment: 58 YOF presented [facility] with CP on 9/9 and was started on heparin for unstable angina. Pt also has a h/o Afib on warfarin PTA (last dose 9/8, admit INR 2.53 on 10mg  daily except 5mg  Mon / Fri). Patient has ruled out for MI, warfarin resumed 9/11 and IV heparin continued until 9/14 when INR was back in therapeutic range.   INR today 1.96, diet advanced to regular 9/1 and patient is tolerating. CBC okay.   Goal of Therapy:  INR 2-3 Monitor platelets by anticoagulation protocol: Yes   Plan:   Coumadin 10 mg tonight.  Possible discharge today, recommend resuming home regimen of Coumadin 10mg  daily except 5mg  on Mon and Friday.   Daily PT/INR  Pharmacy will f/u  Geoffry Paradise, PharmD, BCPS Pager: [phone] 8:01 AM Pharmacy #: [id number]

## 2013-05-07 ENCOUNTER — Ambulatory Visit: Payer: Self-pay | Admitting: Pharmacist Clinician (PhC)/ Clinical Pharmacy Specialist

## 2013-05-07 DIAGNOSIS — Z7901 Long term (current) use of anticoagulants: Secondary | ICD-10-CM

## 2013-05-07 DIAGNOSIS — I482 Chronic atrial fibrillation, unspecified: Secondary | ICD-10-CM

## 2013-06-11 ENCOUNTER — Other Ambulatory Visit: Payer: Self-pay | Admitting: *Deleted

## 2013-06-11 MED ORDER — ALPRAZOLAM 0.5 MG PO TABS: ORAL_TABLET | ORAL | Status: DC

## 2013-06-13 ENCOUNTER — Encounter: Payer: Self-pay | Admitting: Pulmonary Disease

## 2013-06-13 ENCOUNTER — Ambulatory Visit (INDEPENDENT_AMBULATORY_CARE_PROVIDER_SITE_OTHER): Payer: Medicare Other | Admitting: Pulmonary Disease

## 2013-06-13 VITALS — BP 138/80 | HR 55 | Temp 97.8°F | Ht 70.0 in | Wt 257.0 lb

## 2013-06-13 DIAGNOSIS — G4733 Obstructive sleep apnea (adult) (pediatric): Secondary | ICD-10-CM

## 2013-06-13 DIAGNOSIS — J9610 Chronic respiratory failure, unspecified whether with hypoxia or hypercapnia: Secondary | ICD-10-CM

## 2013-06-13 DIAGNOSIS — J449 Chronic obstructive pulmonary disease, unspecified: Secondary | ICD-10-CM

## 2013-06-13 NOTE — Patient Instructions (Addendum)
Make sure you are taking your albuterol nebs at breakfast, lunch, dinner, and bedtime.  Can get additional treatments 2 times if having a bad day Add budesonide to nebs 2 times a day with albuterol. Work on weight loss and conditioning.  followup with me again in 6mos.

## 2013-06-13 NOTE — Assessment & Plan Note (Signed)
The patient appears to be stable from a COPD standpoint.  He needs to continue on his bronchodilator regimen, and I have stressed the importance of weight reduction and muscle conditioning.  These will improve his quality of life more than any other recommendation.

## 2013-06-13 NOTE — Assessment & Plan Note (Signed)
The patient continues on CPAP compliantly, without issues.

## 2013-06-13 NOTE — Progress Notes (Signed)
  Subjective:    Patient ID: Bruce Taylor, male    DOB: 06-21-1941, 72 y.o.   MRN: [id number]  HPI Patient comes in today for followup of his mild COPD as well as obstructive sleep apnea.  He was recently in the hospital with an exacerbation of his congestive heart failure, and has not quite returned to baseline.  He has an occasional cough with scant quantity of discolored mucus, but does not feel that he has a chest cold.  He continues to have lower extremity edema which is being addressed by cardiology.  He has not required his rescue inhaler.  He is wearing CPAP compliantly, without issues with his mask or pressure.   Review of Systems  Constitutional: Negative for fever and unexpected weight change.  HENT: Positive for congestion ( brown mucus from chest). Negative for dental problem, ear pain, nosebleeds, postnasal drip, rhinorrhea, sinus pressure, sneezing, sore throat and trouble swallowing.   Eyes: Negative for redness and itching.  Respiratory: Positive for cough, chest tightness, shortness of breath and wheezing.   Cardiovascular: Negative for palpitations and leg swelling.  Gastrointestinal: Negative for nausea and vomiting.  Genitourinary: Negative for dysuria.  Musculoskeletal: Negative for joint swelling.  Skin: Negative for rash.  Neurological: Negative for headaches.  Hematological: Does not bruise/bleed easily.  Psychiatric/Behavioral: Negative for dysphoric mood. The patient is not nervous/anxious.        Objective:   Physical Exam Obese male in no acute distress Nose without purulence or discharge noted No skin breakdown or pressure necrosis from the CPAP mask Neck without lymphadenopathy or thyromegaly Chest with minimal basilar crackles, adequate air flow, no active wheezing Cardiac exam with regular rate and rhythm, 2/6 systolic murmur Lower extremities with 2+ edema, no cyanosis Alert and oriented, moves all 4 extremities.       Assessment & Plan:

## 2013-06-25 ENCOUNTER — Ambulatory Visit: Payer: Medicare Other | Admitting: Cardiovascular Disease

## 2013-06-29 ENCOUNTER — Ambulatory Visit (INDEPENDENT_AMBULATORY_CARE_PROVIDER_SITE_OTHER): Payer: Medicare Other

## 2013-06-29 ENCOUNTER — Ambulatory Visit (INDEPENDENT_AMBULATORY_CARE_PROVIDER_SITE_OTHER): Payer: Medicare Other | Admitting: Cardiovascular Disease

## 2013-06-29 VITALS — BP 110/80 | HR 57 | Ht 71.0 in | Wt 252.0 lb

## 2013-06-29 DIAGNOSIS — I10 Essential (primary) hypertension: Secondary | ICD-10-CM

## 2013-06-29 DIAGNOSIS — E785 Hyperlipidemia, unspecified: Secondary | ICD-10-CM

## 2013-06-29 DIAGNOSIS — I4891 Unspecified atrial fibrillation: Secondary | ICD-10-CM

## 2013-06-29 DIAGNOSIS — I2510 Atherosclerotic heart disease of native coronary artery without angina pectoris: Secondary | ICD-10-CM

## 2013-06-29 DIAGNOSIS — I482 Chronic atrial fibrillation, unspecified: Secondary | ICD-10-CM

## 2013-06-29 DIAGNOSIS — G250 Essential tremor: Secondary | ICD-10-CM

## 2013-06-29 DIAGNOSIS — Z9861 Coronary angioplasty status: Secondary | ICD-10-CM

## 2013-06-29 DIAGNOSIS — G4733 Obstructive sleep apnea (adult) (pediatric): Secondary | ICD-10-CM

## 2013-06-29 LAB — MDC_IDC_ENUM_SESS_TYPE_INCLINIC
Battery Voltage: 2.77 V
Lead Channel Impedance Value: 544 Ohm
Lead Channel Pacing Threshold Amplitude: 0.75 V
Lead Channel Sensing Intrinsic Amplitude: 15.68 mV
Lead Channel Setting Pacing Pulse Width: 0.4 ms

## 2013-06-29 LAB — PACEMAKER DEVICE OBSERVATION

## 2013-06-29 NOTE — Patient Instructions (Signed)
Your physician recommends that you schedule a follow-up appointment in: 6 ,months

## 2013-07-01 ENCOUNTER — Encounter: Payer: Self-pay | Admitting: Cardiovascular Disease

## 2013-07-01 NOTE — Progress Notes (Signed)
Patient ID: Bruce Taylor, male   DOB: 03-13-1941, 72 y.o.   MRN: [id number]     HPI: Bruce Taylor is a 72 y.o. male who presents to the office for one year cardiology evaluation.  Bruce Taylor is a former patient of Dr. Caprice Kluver who has established coronary artery disease. He has undergone multiple interventions and has stents placed in his LAD, diagonal, and circumflex marginal vessel as well as the proximal to mid right coronary artery. In November 2012,  I performed successful cutting balloon artherotomy for diffuse in-stent restenosis of the stents to his RCA. He has a history of permanent atrial fibrillation.  He is status post permanent  pacemaker and has been on chronic Coumadin therapy. He also is a significant tremor in his right arm greater than left which is not felt to be due to Parkinson's disease and was told of having a genetic reason for this. Apparently he had been hospitalized in September with atypical chest pain but also significant diarrhea. A nuclear perfusion study done on 04/04/2013 did not show a significant perfusion defect and  his ejection fraction was 62%. He required a rectal tube for his diarrhea and one to underwent sigmoidoscopy and biopsy which showed chronic constipation and overflow diarrhea he also has obstructive sleep apnea and is on CPAP nightly. During that hospitalization he was hyponatremic.   Recently, Bruce Taylor has continued to remain stable and denies recurrent chest pain. He denies PND orthopnea appear he has been residing at [facility] nursing home until mid December and then removing then will be moving in with his daughter.  Past Medical History  Diagnosis Date  . Hyperlipidemia   . Hypertension   . 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 Chronic atrial fibrillation   . Ischemic cardiomyopathy     EF 45% by echo 2010  . COPD (chronic obstructive pulmonary disease) 02/04/2010  . On home O2     "24/7"  . Renal  insufficiency   . Tremor   . Angina pectoris   . Obesity, morbid 06/15/2011  . Coronary artery disease     Multiple PCIs  . Myocardial infarct ? 1995  . Blood transfusion ?2011    "hemorrhaging out my penis" (07/13/2012)  . Anemia   . Ulcer     "don't know what kind; it was years ago"  . CHF (congestive heart failure) 04/29/2009    2D Echo - EF 45-50%, normal  . Shortness of breath     "all the time" (07/13/2012)  . Obstructive sleep apnea on CPAP   . Type II diabetes mellitus   . GERD (gastroesophageal reflux disease)   . Stomach ulcer 1980's  . Arthritis     "lower back" (07/13/2012)  . Chronic lower back pain   . Hematuria ? 2011  . PAD (peripheral artery disease) 05/09/2012    Lower Arterial Exam - Right and left distal external iliac arteries-equal to or less than 50% diameter reductions, Bilateral SFA-suggestive 50-69% diameter reduction,right posterior tibial artery and left peroneal artery both occluded  . Bruit 07/23/2010    Carotid Duplex - Right and left ICAs demonstrate small amount of fibrous plaque not hemodynamically significant  . Complication of anesthesia     pt states was very difficult to wake up  . Pacemaker 2010    Past Surgical History  Procedure Laterality Date  . Coronary stent placement      x 8  . Back  surgery      x3  . Coronary angioplasty    . Coronary stent placement  10/2010    Has stents in LAD, RCA,LCX  . Coronary angioplasty with stent placement      "? total of 9 stents over the years; 6 at one time"  . Tonsillectomy      "I was a little boy" (07/13/2012)  . Appendectomy  1954  . Cataract extraction w/ intraocular lens  implant, bilateral  ~ 2011  . Insert / replace / remove pacemaker  07/2008  . Lumbar laminectomy  1990's    "had an extra lower lumbar; had it taken out" (07/13/2012)  . Lumbar disc surgery  1990's X    "cleaned up mess from 1st then 2nd OR" (07/13/2012)  . Hemorrhoid surgery      "in the dr's office; don't know if  they cut them out or banded them"  . Transesophageal echocardiogram  11/13/2004    EF 45-50%, no evidence of intracardiac mass or thrombus, does appear to be a small AST with mild left-to-right and right-to-left shunting  . Cardiac catheterization  06/16/2011    RCA 90% stenosis 3.5x21mm noncompliant Trek balloon was used for high-pressure noncompliant dilation up to approximately 3.51 proximally and 3.4 distally entire segment was reduced to less than 5%  . Cardiac catheterization  10/27/2010    95% stenosis of the distal RCA, 3x20mm bare-metal non-DES Medtronic Integrity stent to overlap stenosis, post-dilated with a 3.5x21 Fulshear Sprinter balloon to 3.69mm. As balloon was pulled back, there was progressive increase in dilation to 3.69mm  . Cardiac catheterization  10/26/2010    RCA - discrete 95% proximal in-stent restenosis, mid vessel 80%discrete stenosis;   . Cardiac catheterization  07/30/2008    2x65mm angioscope balloon cuts 3 to 5 atmospheric pressure  . Flexible sigmoidoscopy N/A 04/09/2013    Procedure: FLEXIBLE SIGMOIDOSCOPY;  Surgeon: Petra Kuba, MD;  Location: [facility];  Service: Endoscopy;  Laterality: N/A;    Allergies  Allergen Reactions  . Penicillins Anaphylaxis and Swelling    "swell up all over my body; throat mainly, I think" (07/13/2012)  . Chlorhexidine Gluconate Itching    Chg cloth "don't remember how bad it was" (07/13/2012)    Current Outpatient Prescriptions  Medication Sig Dispense Refill  . albuterol (PROAIR HFA) 108 (90 BASE) MCG/ACT inhaler Inhale 1-2 puffs into the lungs every 6 (six) hours as needed. For shortness of breath      . albuterol (PROVENTIL) (5 MG/ML) 0.5% nebulizer solution Take 0.5 mLs (2.5 mg total) by nebulization every 4 (four) hours as needed for wheezing or shortness of breath.      . ALPRAZolam (XANAX) 0.5 MG tablet Take one tablet by mouth twice daily for anxiety  60 tablet  5  . aspirin EC 81 MG tablet Take 81 mg by mouth daily.        .  budesonide (PULMICORT) 0.5 MG/2ML nebulizer solution Take 2 mLs (0.5 mg total) by nebulization 2 (two) times daily.      . citalopram (CELEXA) 40 MG tablet Take 40 mg by mouth daily.      . feeding supplement (ENSURE COMPLETE) LIQD Take 237 mLs by mouth 2 (two) times daily between meals.      . furosemide (LASIX) 40 MG tablet Take 40 mg by mouth daily.      Marland Kitchen gabapentin (NEURONTIN) 300 MG capsule Take 300 mg by mouth 3 (three) times daily.      Marland Kitchen  insulin NPH-regular (RELION 70/30) (70-30) 100 UNIT/ML injection Inject 15 Units into the skin 2 (two) times daily with a meal.      . isosorbide mononitrate (IMDUR) 30 MG 24 hr tablet Take 30 mg by mouth daily.        . Magnesium 250 MG TABS Take 250 mg by mouth daily.       . metFORMIN (GLUCOPHAGE) 500 MG tablet Take 500 mg by mouth daily with breakfast.      . metoprolol tartrate (LOPRESSOR) 25 MG tablet Take 1 tablet (25 mg total) by mouth 2 (two) times daily.  180 tablet  3  . nitroGLYCERIN (NITROSTAT) 0.4 MG SL tablet Place 0.4 mg under the tongue every 5 (five) minutes as needed. For chest pain      . oxybutynin (DITROPAN) 5 MG tablet Take 5 mg by mouth 3 (three) times daily as needed (for urinary incontinence).       Marland Kitchen oxymetazoline (AFRIN) 0.05 % nasal spray Place 1 spray into both nostrils 2 (two) times daily.      . polyethylene glycol (MIRALAX / GLYCOLAX) packet Take 17 g by mouth daily.      . primidone (MYSOLINE) 50 MG tablet Take 1-2 tablets (50-100 mg total) by mouth 2 (two) times daily. 1 tab in the morning and 2 tabs at night  90 tablet  2  . simvastatin (ZOCOR) 80 MG tablet Take 40 mg by mouth at bedtime.      Marland Kitchen spironolactone (ALDACTONE) 25 MG tablet Take 12.5 mg by mouth 2 (two) times daily.      Marland Kitchen warfarin (COUMADIN) 10 MG tablet Take 5-10 mg by mouth daily. Takes 10mg  (1 tab) on Tuesday, Wednesday, Thursday, Saturday, Sunday, 5 mg mon, fri       No current facility-administered medications for this visit.    History   Social  History  . Marital Status: Married    Spouse Name: N/A    Number of Children: N/A  . Years of Education: N/A   Occupational History  . retired [employer] architect    Social History Main Topics  . Smoking status: Former Smoker -- 2.00 packs/day for 52 years    Types: Cigarettes    Quit date: 07/26/2006  . Smokeless tobacco: Never Used  . Alcohol Use: Yes     Comment: 07/13/2012"did drink at one time for 5 years; stopped ~ 1980"  . Drug Use: No  . Sexual Activity: No   Other Topics Concern  . Not on file   Social History Narrative  . No narrative on file    Family History  Problem Relation Age of Onset  . Heart disease Father   . Heart disease Brother   . Uterine cancer Mother   . Diabetes Sister    Socially he is married has 2 children and 2 grandchildren. Since his September hospitalization has been residing at a [facility] nursing home but as of next week will be moving in with his daughter. There is no tobacco or alcohol use. He does not exercise.  ROS is negative for fevers, chills or night sweats. He denies skin rash. He does have a right arm tremor which is prominent. He denies change in vision. Denies change in hearing. There is a mild shortness of breath. He is in a wheelchair. He does note fatigue and weakness. He denies anginal symptoms. He denies cough. There is no recent wheezing. He denies presyncope or syncope. He did have an episode of chronic diarrhea  but this has resolved following his hospitalization. He denies awareness of blood in the stool or urine. He does have peripheral neuropathy. He does have diabetes. He denies recent cold or heat intolerance. He denies seizure activity. He is on chronic Coumadin anticoagulation. He denies significant reached recent bleeding.  Other comprehensive 12 point system review is negative.  PE BP 110/80  Pulse 57  Ht 5\' 11"  (1.803 m)  Wt 114.306 kg (252 lb)  BMI 35.16 kg/m2  General: Alert, oriented, no distress.  Skin: normal  turgor, no rashes HEENT: Normocephalic, atraumatic. Pupils round and reactive; sclera anicteric;no lid lag.  Nose without nasal septal hypertrophy Mouth/Parynx benign; Mallinpatti scale 3/4 Neck: No JVD, no carotid briuts Lungs: clear to ausculatation and percussion; no wheezing or rales Heart: Irregularly irregular with controlled ventricular response at approximately 60, s1 s2 normal 1/6 systolic murmur Abdomen: soft, nontender; no hepatosplenomehaly, BS+; abdominal aorta nontender and not dilated by palpation. Back: No CVA tenderness Pulses 2+ Extremities: no clubbing cyanosis or edema, Homan's sign negative ; right arm tremor Neurologic: grossly nonfocal Psychologic: normal affect and mood.  ECG:  VVI pacemaker with ventricular paced rhythm at 57 beats per minute with underlying atrial fibrillation.  LABS:  BMET    Component Value Date/Time   NA 130* 04/12/2013 0445   K 3.9 04/12/2013 0445   CL 93* 04/12/2013 0445   CO2 27 04/12/2013 0445   GLUCOSE 142* 04/12/2013 0445   BUN 18 04/12/2013 0445   CREATININE 1.03 04/12/2013 0445   CALCIUM 9.0 04/12/2013 0445   GFRNONAA 70* 04/12/2013 0445   GFRAA 81* 04/12/2013 0445     Hepatic Function Panel     Component Value Date/Time   PROT 7.8 07/13/2012 1049   ALBUMIN 3.6 07/13/2012 1049   AST 19 07/13/2012 1049   ALT 11 07/13/2012 1049   ALKPHOS 57 07/13/2012 1049   BILITOT 0.7 07/13/2012 1049   BILIDIR 0.1 04/28/2009 1830   IBILI 0.6 04/28/2009 1830     CBC    Component Value Date/Time   WBC 9.5 04/11/2013 0345   RBC 4.68 04/11/2013 0345   HGB 13.5 04/11/2013 0345   HCT 40.2 04/11/2013 0345   PLT 203 04/11/2013 0345   MCV 85.9 04/11/2013 0345   MCH 28.8 04/11/2013 0345   MCHC 33.6 04/11/2013 0345   RDW 15.3 04/11/2013 0345   LYMPHSABS 1.5 10/24/2012 1310   MONOABS 1.0 10/24/2012 1310   EOSABS 0.2 10/24/2012 1310   BASOSABS 0.1 10/24/2012 1310     BNP    Component Value Date/Time   PROBNP 2587.0* 04/03/2013 1204    Lipid Panel       Component Value Date/Time   CHOL 131 06/16/2011 0720   TRIG 144 06/16/2011 0720   HDL 26* 06/16/2011 0720   CHOLHDL 5.0 06/16/2011 0720   VLDL 29 06/16/2011 0720   LDLCALC 76 06/16/2011 0720     RADIOLOGY: No results found.    ASSESSMENT AND PLAN: Bruce Taylor is a 72 year old gentleman who has extensive cardiac history and has undergone multiple percutaneous cardiac interventions with stenting to his LAD, diagonal, circumflex marginal vessel, as well as proximal to mid right coronary arteries. He has permanent atrial fibrillation and is status post VVI pacemaker and is on chronic Coumadin therapy. He recently was hospitalized with diarrhea. He was told of having mild CHF. Presently, he is well compensated. There is no heart failure on exam. There is no edema. He is ventricular paced rhythm. He has lost  50 pounds over the past 14 months. He will be leaving the nursing home next week and will be moving in with his daughter. He continues to her medical regimen. He has obstructive sleep apnea and has been using CPAP therapy. I will see him in 6 months for cardiology reassessment.     Lennette Bihari, MD, [facility]  07/01/2013 6:20 PM

## 2013-07-16 ENCOUNTER — Encounter: Payer: Self-pay | Admitting: Cardiovascular Disease

## 2013-07-16 ENCOUNTER — Other Ambulatory Visit (HOSPITAL_COMMUNITY): Payer: Self-pay | Admitting: Orthopaedic Surgery

## 2013-07-16 DIAGNOSIS — W19XXXA Unspecified fall, initial encounter: Secondary | ICD-10-CM

## 2013-07-16 DIAGNOSIS — M25552 Pain in left hip: Secondary | ICD-10-CM

## 2013-07-17 ENCOUNTER — Ambulatory Visit (HOSPITAL_COMMUNITY): Payer: Medicare Other

## 2013-07-24 ENCOUNTER — Ambulatory Visit (HOSPITAL_COMMUNITY): Payer: Medicare Other

## 2013-07-30 ENCOUNTER — Other Ambulatory Visit: Payer: Self-pay | Admitting: Cardiovascular Disease

## 2013-08-02 ENCOUNTER — Other Ambulatory Visit (HOSPITAL_COMMUNITY): Payer: Self-pay | Admitting: Internal Medicine

## 2013-08-03 ENCOUNTER — Telehealth: Payer: Self-pay | Admitting: Pharmacist Clinician (PhC)/ Clinical Pharmacy Specialist

## 2013-08-03 DIAGNOSIS — Z7901 Long term (current) use of anticoagulants: Secondary | ICD-10-CM

## 2013-08-03 DIAGNOSIS — I4891 Unspecified atrial fibrillation: Secondary | ICD-10-CM

## 2013-08-03 MED ORDER — WARFARIN SODIUM 5 MG PO TABS: ORAL_TABLET | ORAL | Status: DC

## 2013-08-03 MED ORDER — METOPROLOL TARTRATE 25 MG PO TABS: 12.5000 mg | ORAL_TABLET | Freq: Two times a day (BID) | ORAL | Status: AC

## 2013-08-03 MED ORDER — WARFARIN SODIUM 4 MG PO TABS
ORAL_TABLET | ORAL | Status: DC
Start: 2013-08-03 — End: 2013-08-09

## 2013-08-03 NOTE — Telephone Encounter (Signed)
Spoke with dtr - she had multiple questions about pt meds, states when left nursing facility, was not given much information.  Current warfarin dose is 9mg  daily, they have both 5 and 4 mg tabs, [facility]  Dtr asked about prescriptions 1. magnesium - was given meds but no instructions.  Per chart, last seen by TK in December, pt was taking 250mg /d.  [facility] that his previous labs were stable, he could continue med, but also review with Dr. Tenny Crawoss next week if really necessary. 2.  Primidone - facility told her to give 50mg  qam, prescription states 50mg  qam/100mg  qpm.  Our records show the bid dose, advised her to continue with that dose 3.  Allopurinol - didn't know what med was for, will be out in 3-4 days. Explained that used for gout, but must have been given since hospital/nursing home as we have no record. Advised she call Dr. Tenny Crawoss for refills. 4.  pt having problems with constipation over past 2 weeks, taking 2 stool softeners, but only one BM in past 2 weeks.  Advised she try miralax powder and again referred her to Dr. Tenny Crawoss 5. States pt has had decrease in urine output despite drinking more fluids than was recommended by nursing home - referred to Dr. Tenny Crawoss. 6.  Needs refill on metoprolol 25mg  - states current order for 1/2 tab bid - [facility]. 7.  Will mail lab orders for pt to have INR done at facility [place].  If they are able to get home health assistance they are to call and we will arrange for INR checks thru them.

## 2013-08-03 NOTE — Telephone Encounter (Signed)
Standing order for INR labs, will mail to pt dtr Rolly SalterMegan Bennett  [address]  [place] [id number]

## 2013-08-03 NOTE — Telephone Encounter (Signed)
Returning your call Belenda CruiseKristin . Please Call Daughter at [phone]..   Thanks

## 2013-08-09 ENCOUNTER — Emergency Department (HOSPITAL_COMMUNITY): Payer: Medicare Other

## 2013-08-09 ENCOUNTER — Emergency Department (HOSPITAL_COMMUNITY)
Admission: EM | Admit: 2013-08-09 | Discharge: 2013-08-09 | Disposition: A | Discharge status: Home / Self Care | Payer: Medicare Other | Attending: Emergency Medicine | Admitting: Emergency Medicine

## 2013-08-09 ENCOUNTER — Encounter (HOSPITAL_COMMUNITY): Payer: Self-pay | Admitting: Emergency Medicine

## 2013-08-09 DIAGNOSIS — G8929 Other chronic pain: Secondary | ICD-10-CM | POA: Insufficient documentation

## 2013-08-09 DIAGNOSIS — E119 Type 2 diabetes mellitus without complications: Secondary | ICD-10-CM | POA: Insufficient documentation

## 2013-08-09 DIAGNOSIS — Z880 Allergy status to penicillin: Secondary | ICD-10-CM | POA: Insufficient documentation

## 2013-08-09 DIAGNOSIS — Z862 Personal history of diseases of the blood and blood-forming organs and certain disorders involving the immune mechanism: Secondary | ICD-10-CM | POA: Insufficient documentation

## 2013-08-09 DIAGNOSIS — Z872 Personal history of diseases of the skin and subcutaneous tissue: Secondary | ICD-10-CM | POA: Insufficient documentation

## 2013-08-09 DIAGNOSIS — R339 Retention of urine, unspecified: Secondary | ICD-10-CM | POA: Insufficient documentation

## 2013-08-09 DIAGNOSIS — Z87448 Personal history of other diseases of urinary system: Secondary | ICD-10-CM | POA: Insufficient documentation

## 2013-08-09 DIAGNOSIS — Z87891 Personal history of nicotine dependence: Secondary | ICD-10-CM | POA: Insufficient documentation

## 2013-08-09 DIAGNOSIS — G4733 Obstructive sleep apnea (adult) (pediatric): Secondary | ICD-10-CM | POA: Insufficient documentation

## 2013-08-09 DIAGNOSIS — Z9981 Dependence on supplemental oxygen: Secondary | ICD-10-CM | POA: Insufficient documentation

## 2013-08-09 DIAGNOSIS — Z9861 Coronary angioplasty status: Secondary | ICD-10-CM | POA: Insufficient documentation

## 2013-08-09 DIAGNOSIS — J449 Chronic obstructive pulmonary disease, unspecified: Secondary | ICD-10-CM | POA: Insufficient documentation

## 2013-08-09 DIAGNOSIS — K5900 Constipation, unspecified: Secondary | ICD-10-CM

## 2013-08-09 DIAGNOSIS — I10 Essential (primary) hypertension: Secondary | ICD-10-CM | POA: Insufficient documentation

## 2013-08-09 DIAGNOSIS — Z794 Long term (current) use of insulin: Secondary | ICD-10-CM | POA: Insufficient documentation

## 2013-08-09 DIAGNOSIS — R259 Unspecified abnormal involuntary movements: Secondary | ICD-10-CM | POA: Insufficient documentation

## 2013-08-09 DIAGNOSIS — I2510 Atherosclerotic heart disease of native coronary artery without angina pectoris: Secondary | ICD-10-CM | POA: Insufficient documentation

## 2013-08-09 DIAGNOSIS — E785 Hyperlipidemia, unspecified: Secondary | ICD-10-CM | POA: Insufficient documentation

## 2013-08-09 DIAGNOSIS — M129 Arthropathy, unspecified: Secondary | ICD-10-CM | POA: Insufficient documentation

## 2013-08-09 DIAGNOSIS — Z950 Presence of cardiac pacemaker: Secondary | ICD-10-CM | POA: Insufficient documentation

## 2013-08-09 DIAGNOSIS — I509 Heart failure, unspecified: Secondary | ICD-10-CM | POA: Insufficient documentation

## 2013-08-09 DIAGNOSIS — Z79899 Other long term (current) drug therapy: Secondary | ICD-10-CM | POA: Insufficient documentation

## 2013-08-09 DIAGNOSIS — Z7982 Long term (current) use of aspirin: Secondary | ICD-10-CM | POA: Insufficient documentation

## 2013-08-09 DIAGNOSIS — I252 Old myocardial infarction: Secondary | ICD-10-CM | POA: Insufficient documentation

## 2013-08-09 DIAGNOSIS — R109 Unspecified abdominal pain: Secondary | ICD-10-CM

## 2013-08-09 DIAGNOSIS — I4891 Unspecified atrial fibrillation: Secondary | ICD-10-CM | POA: Insufficient documentation

## 2013-08-09 DIAGNOSIS — J4489 Other specified chronic obstructive pulmonary disease: Secondary | ICD-10-CM | POA: Insufficient documentation

## 2013-08-09 DIAGNOSIS — Z7901 Long term (current) use of anticoagulants: Secondary | ICD-10-CM | POA: Insufficient documentation

## 2013-08-09 DIAGNOSIS — R1084 Generalized abdominal pain: Secondary | ICD-10-CM | POA: Insufficient documentation

## 2013-08-09 DIAGNOSIS — I209 Angina pectoris, unspecified: Secondary | ICD-10-CM | POA: Insufficient documentation

## 2013-08-09 LAB — COMPREHENSIVE METABOLIC PANEL
ALBUMIN: 3.4 g/dL — AB (ref 3.5–5.2)
ALT: 12 U/L (ref 0–53)
AST: 24 U/L (ref 0–37)
Alkaline Phosphatase: 99 U/L (ref 39–117)
BILIRUBIN TOTAL: 0.9 mg/dL (ref 0.3–1.2)
BUN: 25 mg/dL — AB (ref 6–23)
CO2: 26 mEq/L (ref 19–32)
CREATININE: 1.2 mg/dL (ref 0.50–1.35)
Calcium: 9.6 mg/dL (ref 8.4–10.5)
Chloride: 93 mEq/L — ABNORMAL LOW (ref 96–112)
GFR calc Af Amer: 67 mL/min — ABNORMAL LOW (ref >90)
GFR calc non Af Amer: 58 mL/min — ABNORMAL LOW (ref >90)
Glucose, Bld: 118 mg/dL — ABNORMAL HIGH (ref 70–99)
Potassium: 4.9 mEq/L (ref 3.7–5.3)
Sodium: 134 mEq/L — ABNORMAL LOW (ref 137–147)
TOTAL PROTEIN: 8.5 g/dL — AB (ref 6.0–8.3)

## 2013-08-09 LAB — CBC WITH DIFFERENTIAL/PLATELET
BASOS PCT: 0 % (ref 0–1)
Basophils Absolute: 0 10*3/uL (ref 0.0–0.1)
EOS ABS: 0.1 10*3/uL (ref 0.0–0.7)
EOS PCT: 1 % (ref 0–5)
HEMATOCRIT: 42.9 % (ref 39.0–52.0)
HEMOGLOBIN: 14.4 g/dL (ref 13.0–17.0)
Lymphocytes Relative: 11 % — ABNORMAL LOW (ref 12–46)
Lymphs Abs: 1.4 10*3/uL (ref 0.7–4.0)
MCH: 31.2 pg (ref 26.0–34.0)
MCHC: 33.6 g/dL (ref 30.0–36.0)
MCV: 93.1 fL (ref 78.0–100.0)
MONO ABS: 1.4 10*3/uL — AB (ref 0.1–1.0)
MONOS PCT: 11 % (ref 3–12)
Neutro Abs: 9.5 10*3/uL — ABNORMAL HIGH (ref 1.7–7.7)
Neutrophils Relative %: 77 % (ref 43–77)
Platelets: 203 10*3/uL (ref 150–400)
RBC: 4.61 MIL/uL (ref 4.22–5.81)
RDW: 16.5 % — ABNORMAL HIGH (ref 11.5–15.5)
WBC: 12.4 10*3/uL — ABNORMAL HIGH (ref 4.0–10.5)

## 2013-08-09 LAB — URINALYSIS, ROUTINE W REFLEX MICROSCOPIC
Bilirubin Urine: NEGATIVE
GLUCOSE, UA: NEGATIVE mg/dL
KETONES UR: NEGATIVE mg/dL
LEUKOCYTES UA: NEGATIVE
Nitrite: NEGATIVE
PROTEIN: NEGATIVE mg/dL
Specific Gravity, Urine: 1.015 (ref 1.005–1.030)
Urobilinogen, UA: 1 mg/dL (ref 0.0–1.0)
pH: 6.5 (ref 5.0–8.0)

## 2013-08-09 LAB — PROTIME-INR
INR: 2.11 — AB (ref 0.00–1.49)
Prothrombin Time: 23 seconds — ABNORMAL HIGH (ref 11.6–15.2)

## 2013-08-09 LAB — URINE MICROSCOPIC-ADD ON

## 2013-08-09 LAB — TROPONIN I: Troponin I: <0.3 ng/mL (ref <0.30)

## 2013-08-09 LAB — APTT: APTT: 50 s — AB (ref 24–37)

## 2013-08-09 LAB — LIPASE, BLOOD: LIPASE: 12 U/L (ref 11–59)

## 2013-08-09 LAB — OCCULT BLOOD, POC DEVICE: FECAL OCCULT BLD: NEGATIVE

## 2013-08-09 MED ORDER — SODIUM CHLORIDE 0.9 % IV BOLUS (SEPSIS)
500.0000 mL | Freq: Once | INTRAVENOUS | Status: AC
  Administered 2013-08-09: 500 mL via INTRAVENOUS

## 2013-08-09 MED ORDER — MORPHINE SULFATE 4 MG/ML IJ SOLN
4.0000 mg | Freq: Once | INTRAMUSCULAR | Status: AC
  Administered 2013-08-09: 4 mg via INTRAVENOUS
  Filled 2013-08-09: qty 1

## 2013-08-09 MED ORDER — IOHEXOL 300 MG/ML  SOLN
25.0000 mL | Freq: Once | INTRAMUSCULAR | Status: AC | PRN
  Administered 2013-08-09: 25 mL via ORAL

## 2013-08-09 MED ORDER — IOHEXOL 300 MG/ML  SOLN
80.0000 mL | Freq: Once | INTRAMUSCULAR | Status: AC | PRN
  Administered 2013-08-09: 80 mL via INTRAVENOUS

## 2013-08-09 NOTE — ED Notes (Addendum)
Pt arrives via EMS from home with his daughter. C/o trouble urinating and having bowel movements x5 days. Last BM was yesterday. Pt bedridden due to neuropathy. Chronic O2, CPAP at night. Paced rhythm. Vitals WNL.  Hx COPD, extensive cardiac history.

## 2013-08-09 NOTE — Discharge Instructions (Signed)
Abdominal Pain, Adult °Many things can cause abdominal pain. Usually, abdominal pain is not caused by a disease and will improve without treatment. It can often be observed and treated at home. Your health care provider will do a physical exam and possibly order blood tests and X-rays to help determine the seriousness of your pain. However, in many cases, more time must pass before a clear cause of the pain can be found. Before that point, your health care provider may not know if you need more testing or further treatment. °HOME CARE INSTRUCTIONS  °Monitor your abdominal pain for any changes. The following actions may help to alleviate any discomfort you are experiencing: °· Only take over-the-counter or prescription medicines as directed by your health care provider. °· Do not take laxatives unless directed to do so by your health care provider. °· Try a clear liquid diet (broth, tea, or water) as directed by your health care provider. Slowly move to a bland diet as tolerated. °SEEK MEDICAL CARE IF: °· You have unexplained abdominal pain. °· You have abdominal pain associated with nausea or diarrhea. °· You have pain when you urinate or have a bowel movement. °· You experience abdominal pain that wakes you in the night. °· You have abdominal pain that is worsened or improved by eating food. °· You have abdominal pain that is worsened with eating fatty foods. °SEEK IMMEDIATE MEDICAL CARE IF:  °· Your pain does not go away within 2 hours. °· You have a fever. °· You keep throwing up (vomiting). °· Your pain is felt only in portions of the abdomen, such as the right side or the left lower portion of the abdomen. °· You pass bloody or black tarry stools. °MAKE SURE YOU: °· Understand these instructions.   °· Will watch your condition.   °· Will get help right away if you are not doing well or get worse.   °Document Released: 04/21/2005 Document Revised: 05/02/2013 Document Reviewed: 03/21/2013 °ExitCare® Patient  Information ©2014 [employer] LLC. ° °Abdominal Pain, Adult °Many things can cause abdominal pain. Usually, abdominal pain is not caused by a disease and will improve without treatment. It can often be observed and treated at home. Your health care provider will do a physical exam and possibly order blood tests and X-rays to help determine the seriousness of your pain. However, in many cases, more time must pass before a clear cause of the pain can be found. Before that point, your health care provider may not know if you need more testing or further treatment. °HOME CARE INSTRUCTIONS  °Monitor your abdominal pain for any changes. The following actions may help to alleviate any discomfort you are experiencing: °· Only take over-the-counter or prescription medicines as directed by your health care provider. °· Do not take laxatives unless directed to do so by your health care provider. °· Try a clear liquid diet (broth, tea, or water) as directed by your health care provider. Slowly move to a bland diet as tolerated. °SEEK MEDICAL CARE IF: °· You have unexplained abdominal pain. °· You have abdominal pain associated with nausea or diarrhea. °· You have pain when you urinate or have a bowel movement. °· You experience abdominal pain that wakes you in the night. °· You have abdominal pain that is worsened or improved by eating food. °· You have abdominal pain that is worsened with eating fatty foods. °SEEK IMMEDIATE MEDICAL CARE IF:  °· Your pain does not go away within 2 hours. °· You have a   fever.  You keep throwing up (vomiting).  Your pain is felt only in portions of the abdomen, such as the right side or the left lower portion of the abdomen.  You pass bloody or black tarry stools. MAKE SURE YOU:  Understand these instructions.   Will watch your condition.   Will get help right away if you are not doing well or get worse.  Document Released: 04/21/2005 Document Revised: 05/02/2013 Document Reviewed:  03/21/2013 [facility] Patient Information 2014 [place].  Constipation, Adult Constipation is when a person:  Poops (bowel movement) less than 3 times a week.  Has a hard time pooping.  Has poop that is dry, hard, or bigger than normal. HOME CARE   Eat more fiber, such as fruits, vegetables, whole grains like brown rice, and beans.  Eat less fatty foods and sugar. This includes [place] fries, hamburgers, cookies, candy, and soda.  If you are not getting enough fiber from food, take products with added fiber in them (supplements).  Drink enough fluid to keep your pee (urine) clear or pale yellow.  Go to the restroom when you feel like you need to poop. Do not hold it.  Only take medicine as told by your doctor. Do not take medicines that help you poop (laxatives) without talking to your doctor first.  Exercise on a regular basis, or as told by your doctor. GET HELP RIGHT AWAY IF:   You have bright red blood in your poop (stool).  Your constipation lasts more than 4 days or gets worse.  You have belly (abdomen) or butt (rectal) pain.  You have thin poop (as thin as a pencil).  You lose weight, and it cannot be explained. MAKE SURE YOU:   Understand these instructions.  Will watch your condition.  Will get help right away if you are not doing well or get worse. Document Released: 12/29/2007 Document Revised: 10/04/2011 Document Reviewed: 04/23/2013 [facility] Patient Information 2014 [place], [place].

## 2013-08-09 NOTE — ED Notes (Signed)
Removed foley catheter per Dr. Thomasene MohairMasenari.

## 2013-08-09 NOTE — ED Notes (Signed)
Pt returned from radiology.

## 2013-08-09 NOTE — ED Provider Notes (Signed)
CSN: [id number]     Arrival date & time 08/09/13  7846 History   First MD Initiated Contact with Patient 08/09/13 0701     Chief Complaint  Patient presents with  . Abdominal Pain   (Consider location/radiation/quality/duration/timing/severity/associated sxs/prior Treatment) HPI Comments: 73 yo wm with MMP to include CAD - MI stents, Hyperlipidemia, Complete Heart block, Afib, ischemic CM, COPD on home O2 3L, Renal insuffiency, tremor, angina, OSA, DM Type II, GERD, ulcer, presents to ER with cc of ABD pain.    Pt complains of ABD pain diffuse, constipation, and difficulty urinating.    Pt lives with his daughter and son-in-law.    Patient is a 73 y.o. male presenting with abdominal pain. The history is provided by the patient and the EMS personnel.  Abdominal Pain Pain location:  Generalized Pain quality: aching and bloating   Pain radiates to:  Does not radiate Pain severity:  Moderate Onset quality:  Gradual Duration:  1 week Timing:  Constant Progression:  Worsening Chronicity:  Recurrent Context comment:  Concerned with constipation and urinary retention Relieved by: tried miralax and mag citrate. Worsened by:  Nothing tried Associated symptoms: constipation   Associated symptoms: no diarrhea, no nausea and no vomiting     Past Medical History  Diagnosis Date  . Hyperlipidemia   . Hypertension   . 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 Chronic atrial fibrillation   . Ischemic cardiomyopathy     EF 45% by echo 2010  . COPD (chronic obstructive pulmonary disease) 02/04/2010  . On home O2     "24/7"  . Renal insufficiency   . Tremor   . Angina pectoris   . Obesity, morbid 06/15/2011  . Coronary artery disease     Multiple PCIs  . Myocardial infarct ? 1995  . Blood transfusion ?2011    "hemorrhaging out my penis" (07/13/2012)  . Anemia   . Ulcer     "don't know what kind; it was years ago"  . CHF (congestive heart failure) 04/29/2009    2D Echo - EF  45-50%, normal  . Shortness of breath     "all the time" (07/13/2012)  . Obstructive sleep apnea on CPAP   . Type II diabetes mellitus   . GERD (gastroesophageal reflux disease)   . Stomach ulcer 1980's  . Arthritis     "lower back" (07/13/2012)  . Chronic lower back pain   . Hematuria ? 2011  . PAD (peripheral artery disease) 05/09/2012    Lower Arterial Exam - Right and left distal external iliac arteries-equal to or less than 50% diameter reductions, Bilateral SFA-suggestive 50-69% diameter reduction,right posterior tibial artery and left peroneal artery both occluded  . Bruit 07/23/2010    Carotid Duplex - Right and left ICAs demonstrate small amount of fibrous plaque not hemodynamically significant  . Complication of anesthesia     pt states was very difficult to wake up  . Pacemaker 2010   Past Surgical History  Procedure Laterality Date  . Coronary stent placement      x 8  . Back surgery      x3  . Coronary angioplasty    . Coronary stent placement  10/2010    Has stents in LAD, RCA,LCX  . Coronary angioplasty with stent placement      "? total of 9 stents over the years; 6 at one time"  . Tonsillectomy      "I was a little boy" (  07/13/2012)  . Appendectomy  1954  . Cataract extraction w/ intraocular lens  implant, bilateral  ~ 2011  . Insert / replace / remove pacemaker  07/2008  . Lumbar laminectomy  1990's    "had an extra lower lumbar; had it taken out" (07/13/2012)  . Lumbar disc surgery  1990's X    "cleaned up mess from 1st then 2nd OR" (07/13/2012)  . Hemorrhoid surgery      "in the dr's office; don't know if they cut them out or banded them"  . Transesophageal echocardiogram  11/13/2004    EF 45-50%, no evidence of intracardiac mass or thrombus, does appear to be a small AST with mild left-to-right and right-to-left shunting  . Cardiac catheterization  06/16/2011    RCA 90% stenosis 3.5x61mm noncompliant Trek balloon was used for high-pressure noncompliant  dilation up to approximately 3.51 proximally and 3.4 distally entire segment was reduced to less than 5%  . Cardiac catheterization  10/27/2010    95% stenosis of the distal RCA, 3x9mm bare-metal non-DES Medtronic Integrity stent to overlap stenosis, post-dilated with a 3.5x21 [place] Sprinter balloon to 3.12mm. As balloon was pulled back, there was progressive increase in dilation to 3.15mm  . Cardiac catheterization  10/26/2010    RCA - discrete 95% proximal in-stent restenosis, mid vessel 80%discrete stenosis;   . Cardiac catheterization  07/30/2008    2x47mm angioscope balloon cuts 3 to 5 atmospheric pressure  . Flexible sigmoidoscopy N/A 04/09/2013    Procedure: FLEXIBLE SIGMOIDOSCOPY;  Surgeon: Petra Kuba, MD;  Location: [facility];  Service: Endoscopy;  Laterality: N/A;   Family History  Problem Relation Age of Onset  . Heart disease Father   . Heart disease Brother   . Uterine cancer Mother   . Diabetes Sister    History  Substance Use Topics  . Smoking status: Former Smoker -- 2.00 packs/day for 52 years    Types: Cigarettes    Quit date: 07/26/2006  . Smokeless tobacco: Never Used  . Alcohol Use: Yes     Comment: 07/13/2012"did drink at one time for 5 years; stopped ~ 1980"    Review of Systems  Constitutional: Negative.   HENT: Negative.   Eyes: Negative.   Respiratory: Negative.   Cardiovascular: Negative.   Gastrointestinal: Positive for abdominal pain and constipation. Negative for nausea, vomiting, diarrhea, blood in stool, anal bleeding and rectal pain.  Endocrine: Negative.   Genitourinary: Negative.   Musculoskeletal: Negative.   Allergic/Immunologic: Negative.   Neurological: Positive for tremors. Negative for dizziness, seizures, syncope, facial asymmetry, speech difficulty, light-headedness, numbness and headaches.       Tremor chronic  All other systems reviewed and are negative.    Allergies  Penicillins and Chlorhexidine gluconate  Home Medications    Current Outpatient Rx  Name  Route  Sig  Dispense  Refill  . albuterol (PROAIR HFA) 108 (90 BASE) MCG/ACT inhaler   Inhalation   Inhale 1-2 puffs into the lungs every 6 (six) hours as needed for shortness of breath. For shortness of breath         . albuterol (PROVENTIL) (5 MG/ML) 0.5% nebulizer solution   Nebulization   Take 0.5 mLs (2.5 mg total) by nebulization every 4 (four) hours as needed for wheezing or shortness of breath.         . ALPRAZolam (XANAX) 0.5 MG tablet      Take one tablet by mouth twice daily for anxiety   60 tablet   5   .  aspirin EC 81 MG tablet   Oral   Take 81 mg by mouth daily.           . budesonide (PULMICORT) 0.5 MG/2ML nebulizer solution   Nebulization   Take 0.5 mg by nebulization 2 (two) times daily.         . citalopram (CELEXA) 40 MG tablet   Oral   Take 40 mg by mouth daily.         . furosemide (LASIX) 40 MG tablet      TAKE ONE TABLET BY MOUTH EVERY DAY   90 tablet   2   . gabapentin (NEURONTIN) 300 MG capsule   Oral   Take 300 mg by mouth 3 (three) times daily.         . insulin NPH-regular (RELION 70/30) (70-30) 100 UNIT/ML injection   Subcutaneous   Inject 15 Units into the skin 2 (two) times daily with a meal.         . insulin NPH-regular Human (RELION 70/30) (70-30) 100 UNIT/ML injection   Subcutaneous   Inject into the skin. Sliding scale         . isosorbide mononitrate (IMDUR) 30 MG 24 hr tablet   Oral   Take 30 mg by mouth daily.           . Magnesium 250 MG TABS   Oral   Take 250 mg by mouth daily.          . metFORMIN (GLUCOPHAGE) 500 MG tablet   Oral   Take 500 mg by mouth daily with breakfast.         . metoprolol tartrate (LOPRESSOR) 25 MG tablet   Oral   Take 0.5 tablets (12.5 mg total) by mouth 2 (two) times daily.   90 tablet   0   . oxybutynin (DITROPAN) 5 MG tablet   Oral   Take 5 mg by mouth 3 (three) times daily as needed (for urinary incontinence).          Marland Kitchen  oxymetazoline (AFRIN) 0.05 % nasal spray   Each Nare   Place 1 spray into both nostrils 2 (two) times daily as needed for congestion.          . primidone (MYSOLINE) 50 MG tablet   Oral   Take 1-2 tablets (50-100 mg total) by mouth 2 (two) times daily. 1 tab in the morning and 2 tabs at night   90 tablet   2   . simvastatin (ZOCOR) 40 MG tablet   Oral   Take 40 mg by mouth daily.         . simvastatin (ZOCOR) 80 MG tablet   Oral   Take 40 mg by mouth at bedtime.         Marland Kitchen spironolactone (ALDACTONE) 25 MG tablet   Oral   Take 12.5 mg by mouth 2 (two) times daily.         Marland Kitchen warfarin (COUMADIN) 5 MG tablet   Oral   Take 5 mg by mouth daily.         . nitroGLYCERIN (NITROSTAT) 0.4 MG SL tablet   Sublingual   Place 0.4 mg under the tongue every 5 (five) minutes as needed for chest pain.          . polyethylene glycol (MIRALAX / GLYCOLAX) packet   Oral   Take 17 g by mouth every 4 (four) hours.          BP 125/58  Pulse 55  Temp(Src) 97.5 F (36.4 C) (Oral)  Resp 16  SpO2 90% Physical Exam  Nursing note and vitals reviewed. Constitutional: He is oriented to person, place, and time. He appears well-developed and well-nourished.  HENT:  Head: Normocephalic.  Mouth/Throat: Oropharynx is clear and moist. No oropharyngeal exudate.  Eyes: Conjunctivae are normal. Right eye exhibits no discharge. Left eye exhibits no discharge.  Neck: Normal range of motion. Neck supple. No JVD present.  Cardiovascular: Normal rate and regular rhythm.   Pulmonary/Chest: Effort normal and breath sounds normal. No stridor.  Abdominal: Soft. He exhibits no distension and no mass. There is tenderness. There is no rebound and no guarding.  Genitourinary:  DRE: normal tone, large stool burden noted, brown stool  Musculoskeletal: Normal range of motion. He exhibits no edema and no tenderness.  Neurological: He is alert and oriented to person, place, and time. He has normal reflexes.   Skin: Skin is warm and dry.    ED Course  Fecal disimpaction Date/Time: 08/09/2013 5:25 PM Performed by: Redgie GrayerMASNERI, DAVID Authorized by: Redgie GrayerMASNERI, DAVID Consent: Verbal consent obtained. written consent not obtained. Risks and benefits: risks, benefits and alternatives were discussed Consent given by: patient Patient understanding: patient states understanding of the procedure being performed Patient consent: the patient's understanding of the procedure matches consent given Procedure consent: procedure consent matches procedure scheduled Patient identity confirmed: verbally with patient Time out: Immediately prior to procedure a "time out" was called to verify the correct patient, procedure, equipment, support staff and site/side marked as required. Local anesthesia used: no Patient sedated: no Comments: Fecal disimpaction performed by me with nursing staff present. Large volume of dark brown stool produced via disimpaction with index finger. Patient had significant relief after procedure.   (including critical care time) Labs Review Labs Reviewed  CBC WITH DIFFERENTIAL - Abnormal; Notable for the following:    WBC 12.4 (*)    RDW 16.5 (*)    Neutro Abs 9.5 (*)    Lymphocytes Relative 11 (*)    Monocytes Absolute 1.4 (*)    All other components within normal limits  COMPREHENSIVE METABOLIC PANEL - Abnormal; Notable for the following:    Sodium 134 (*)    Chloride 93 (*)    Glucose, Bld 118 (*)    BUN 25 (*)    Total Protein 8.5 (*)    Albumin 3.4 (*)    GFR calc non Af Amer 58 (*)    GFR calc Af Amer 67 (*)    All other components within normal limits  APTT - Abnormal; Notable for the following:    aPTT 50 (*)    All other components within normal limits  PROTIME-INR - Abnormal; Notable for the following:    Prothrombin Time 23.0 (*)    INR 2.11 (*)    All other components within normal limits  URINALYSIS, ROUTINE W REFLEX MICROSCOPIC - Abnormal; Notable for the  following:    Hgb urine dipstick SMALL (*)    All other components within normal limits  LIPASE, BLOOD  TROPONIN I  URINE MICROSCOPIC-ADD ON  OCCULT BLOOD, POC DEVICE   Imaging Review Ct Abdomen Pelvis W Contrast  08/09/2013   CLINICAL DATA:  Diffuse abdominal pain.  EXAM: CT ABDOMEN AND PELVIS WITH CONTRAST  TECHNIQUE: Multidetector CT imaging of the abdomen and pelvis was performed using the standard protocol following bolus administration of intravenous contrast.  CONTRAST:  80mL OMNIPAQUE IOHEXOL 300 MG/ML SOLN, 25mL OMNIPAQUE IOHEXOL 300 MG/ML SOLN  COMPARISON:  CT scan of October 24, 2012.  FINDINGS: Minimal bilateral pleural effusions are noted with adjacent subsegmental atelectasis. Multilevel degenerative disc disease is noted in the lumbar spine.  No focal abnormality is noted in the liver, spleen or pancreas. Minimal cholelithiasis is noted. Adrenal glands appear normal. 4.3 cm exophytic left renal cyst is again noted and unchanged. No renal or ureteral calculi are noted. No hydronephrosis or renal obstruction is noted. Atherosclerotic calcifications of abdominal aorta and iliac arteries are noted without aneurysm formation. There is no evidence of bowel obstruction. Large amount of stool is present within the rectum. Urinary bladder is decompressed secondary to Foley catheter. 4.3 x 3.3 cm oval-shaped fluid collection is seen lateral to the left kidney which is not significantly changed compared to prior exam. No significant adenopathy is noted.  IMPRESSION: Minimal bilateral pleural effusions with adjacent subsegmental atelectasis.  Multilevel degenerative disc disease is noted in the lumbar spine.  Minimal cholelithiasis is noted.  Large amount of stool is noted in the rectum which may represent impaction.   Electronically Signed   By: Roque Lias M.D.   On: 08/09/2013 09:50   Dg Abd Acute W/chest  08/09/2013   CLINICAL DATA:  Abdominal pain.  EXAM: ACUTE ABDOMEN SERIES (ABDOMEN 2 VIEW &  CHEST 1 VIEW)  COMPARISON:  April 09, 2013.  FINDINGS: There is no evidence of dilated bowel loops or free intraperitoneal air. Stool is noted in the rectum. No radiopaque calculi or other significant radiographic abnormality is seen. Mild cardiomegaly is noted. Left-sided pacemaker is noted. No acute pulmonary disease is noted. Both lungs are clear. Degenerative changes of lumbar spine are noted.  IMPRESSION: No evidence of bowel obstruction or ileus. No acute cardiopulmonary disease.   Electronically Signed   By: Roque Lias M.D.   On: 08/09/2013 08:20    EKG Interpretation   None      Results for orders placed during the hospital encounter of 08/09/13  CBC WITH DIFFERENTIAL      Result Value Range   WBC 12.4 (*) 4.0 - 10.5 K/uL   RBC 4.61  4.22 - 5.81 MIL/uL   Hemoglobin 14.4  13.0 - 17.0 g/dL   HCT 16.1  09.6 - 04.5 %   MCV 93.1  78.0 - 100.0 fL   MCH 31.2  26.0 - 34.0 pg   MCHC 33.6  30.0 - 36.0 g/dL   RDW 40.9 (*) 81.1 - 91.4 %   Platelets 203  150 - 400 K/uL   Neutrophils Relative % 77  43 - 77 %   Neutro Abs 9.5 (*) 1.7 - 7.7 K/uL   Lymphocytes Relative 11 (*) 12 - 46 %   Lymphs Abs 1.4  0.7 - 4.0 K/uL   Monocytes Relative 11  3 - 12 %   Monocytes Absolute 1.4 (*) 0.1 - 1.0 K/uL   Eosinophils Relative 1  0 - 5 %   Eosinophils Absolute 0.1  0.0 - 0.7 K/uL   Basophils Relative 0  0 - 1 %   Basophils Absolute 0.0  0.0 - 0.1 K/uL  COMPREHENSIVE METABOLIC PANEL      Result Value Range   Sodium 134 (*) 137 - 147 mEq/L   Potassium 4.9  3.7 - 5.3 mEq/L   Chloride 93 (*) 96 - 112 mEq/L   CO2 26  19 - 32 mEq/L   Glucose, Bld 118 (*) 70 - 99 mg/dL   BUN 25 (*) 6 - 23 mg/dL  Creatinine, Ser 1.20  0.50 - 1.35 mg/dL   Calcium 9.6  8.4 - 16.1 mg/dL   Total Protein 8.5 (*) 6.0 - 8.3 g/dL   Albumin 3.4 (*) 3.5 - 5.2 g/dL   AST 24  0 - 37 U/L   ALT 12  0 - 53 U/L   Alkaline Phosphatase 99  39 - 117 U/L   Total Bilirubin 0.9  0.3 - 1.2 mg/dL   GFR calc non Af Amer 58 (*) >90  mL/min   GFR calc Af Amer 67 (*) >90 mL/min  LIPASE, BLOOD      Result Value Range   Lipase 12  11 - 59 U/L  APTT      Result Value Range   aPTT 50 (*) 24 - 37 seconds  PROTIME-INR      Result Value Range   Prothrombin Time 23.0 (*) 11.6 - 15.2 seconds   INR 2.11 (*) 0.00 - 1.49  URINALYSIS, ROUTINE W REFLEX MICROSCOPIC      Result Value Range   Color, Urine YELLOW  YELLOW   APPearance CLEAR  CLEAR   Specific Gravity, Urine 1.015  1.005 - 1.030   pH 6.5  5.0 - 8.0   Glucose, UA NEGATIVE  NEGATIVE mg/dL   Hgb urine dipstick SMALL (*) NEGATIVE   Bilirubin Urine NEGATIVE  NEGATIVE   Ketones, ur NEGATIVE  NEGATIVE mg/dL   Protein, ur NEGATIVE  NEGATIVE mg/dL   Urobilinogen, UA 1.0  0.0 - 1.0 mg/dL   Nitrite NEGATIVE  NEGATIVE   Leukocytes, UA NEGATIVE  NEGATIVE  TROPONIN I      Result Value Range   Troponin I <0.30  <0.30 ng/mL  URINE MICROSCOPIC-ADD ON      Result Value Range   Squamous Epithelial / LPF RARE  RARE   WBC, UA 0-2  <3 WBC/hpf   RBC / HPF 3-6  <3 RBC/hpf  OCCULT BLOOD, POC DEVICE      Result Value Range   Fecal Occult Bld NEGATIVE  NEGATIVE     MDM   1. Constipation   2. Abdominal pain   3.  Fecal disimpaction  73 yo wm with MMP presents to ER with cc of abd pain.  Patient has multiple comorbidities as such we will initiate a workup to include blood work, EKG, troponin, acute abdominal series, CT abdomen and pelvis.  ER course: Patient given pain medications for his abdominal pain with minimal improvement. Labwork reveals elevated serum white blood cell count 12.4. Urine is unremarkable. CMP with mild abnormalities, lipase negative, INR 2.1. Acute abdominal series without obstruction or ileus. CT abdomen pelvis reveals large amount of stool noted in the rectum consistent with impaction.   Fecal disimpaction performed. Patient had significant relief. Despite patient's multiple medical problems and comorbidities there is no evidence of emergent issue at this  time. He is cleared for discharge to home. I lengthy discussion with his sister and his daughter. The patient lives with his daughter. She is comfortable taking him home. She will go home to obtain his at home oxygen and taken home.  Patient was observed in the ED while waiting transportation. He had no acute issues. Time of discharge she was without complaint. Daughter will take patient home and he was a followup with his primary care provider within the next day or 2. He should continue with MiraLax. Strict ER precautions were given and the patient and his daughter agree with this plan.  Darlys Gales, MD 08/09/13 [phone]

## 2013-08-09 NOTE — ED Notes (Signed)
Pt came from [facility] via ambulance for constipation and difficulty voiding.  Reports only small amounts of stool.

## 2013-08-20 ENCOUNTER — Telehealth: Payer: Self-pay | Admitting: *Deleted

## 2013-08-20 NOTE — Telephone Encounter (Signed)
B[facility] with [facility] called stating that Bruce Taylor is not mobile right now and his daughter would like for them to take his INR. Please call Bruce Taylor back with an order for PT/INR

## 2013-08-20 NOTE — Telephone Encounter (Signed)
LMOM for Debbie - RN to draw pt/inr at next visit with Mr. Maury DusChestnutt.

## 2013-08-20 NOTE — Telephone Encounter (Signed)
Message forwarded to K. Alvstad, PharmD.  

## 2013-08-21 ENCOUNTER — Encounter (HOSPITAL_COMMUNITY)
Admission: EM | Disposition: A | Discharge status: Home / Self Care | Payer: Self-pay | Source: Home / Self Care | Attending: Emergency Medicine

## 2013-08-21 ENCOUNTER — Encounter (HOSPITAL_COMMUNITY): Payer: Self-pay | Admitting: Emergency Medicine

## 2013-08-21 ENCOUNTER — Emergency Department (HOSPITAL_COMMUNITY): Payer: Medicare Other

## 2013-08-21 ENCOUNTER — Emergency Department (HOSPITAL_COMMUNITY)
Admission: EM | Admit: 2013-08-21 | Discharge: 2013-08-21 | Disposition: A | Discharge status: Home / Self Care | Payer: Medicare Other | Attending: Emergency Medicine | Admitting: Emergency Medicine

## 2013-08-21 DIAGNOSIS — I442 Atrioventricular block, complete: Secondary | ICD-10-CM | POA: Insufficient documentation

## 2013-08-21 DIAGNOSIS — I10 Essential (primary) hypertension: Secondary | ICD-10-CM | POA: Insufficient documentation

## 2013-08-21 DIAGNOSIS — Z9861 Coronary angioplasty status: Secondary | ICD-10-CM | POA: Insufficient documentation

## 2013-08-21 DIAGNOSIS — I2510 Atherosclerotic heart disease of native coronary artery without angina pectoris: Secondary | ICD-10-CM | POA: Insufficient documentation

## 2013-08-21 DIAGNOSIS — Z7901 Long term (current) use of anticoagulants: Secondary | ICD-10-CM | POA: Insufficient documentation

## 2013-08-21 DIAGNOSIS — K209 Esophagitis, unspecified without bleeding: Secondary | ICD-10-CM | POA: Insufficient documentation

## 2013-08-21 DIAGNOSIS — N289 Disorder of kidney and ureter, unspecified: Secondary | ICD-10-CM | POA: Insufficient documentation

## 2013-08-21 DIAGNOSIS — I252 Old myocardial infarction: Secondary | ICD-10-CM | POA: Insufficient documentation

## 2013-08-21 DIAGNOSIS — I4891 Unspecified atrial fibrillation: Secondary | ICD-10-CM | POA: Insufficient documentation

## 2013-08-21 DIAGNOSIS — K920 Hematemesis: Secondary | ICD-10-CM | POA: Insufficient documentation

## 2013-08-21 DIAGNOSIS — M545 Low back pain, unspecified: Secondary | ICD-10-CM | POA: Insufficient documentation

## 2013-08-21 DIAGNOSIS — J4489 Other specified chronic obstructive pulmonary disease: Secondary | ICD-10-CM | POA: Insufficient documentation

## 2013-08-21 DIAGNOSIS — E119 Type 2 diabetes mellitus without complications: Secondary | ICD-10-CM | POA: Insufficient documentation

## 2013-08-21 DIAGNOSIS — Z87891 Personal history of nicotine dependence: Secondary | ICD-10-CM | POA: Insufficient documentation

## 2013-08-21 DIAGNOSIS — Z8711 Personal history of peptic ulcer disease: Secondary | ICD-10-CM | POA: Insufficient documentation

## 2013-08-21 DIAGNOSIS — G8929 Other chronic pain: Secondary | ICD-10-CM | POA: Insufficient documentation

## 2013-08-21 DIAGNOSIS — R259 Unspecified abnormal involuntary movements: Secondary | ICD-10-CM | POA: Insufficient documentation

## 2013-08-21 DIAGNOSIS — R0989 Other specified symptoms and signs involving the circulatory and respiratory systems: Secondary | ICD-10-CM | POA: Insufficient documentation

## 2013-08-21 DIAGNOSIS — G4733 Obstructive sleep apnea (adult) (pediatric): Secondary | ICD-10-CM | POA: Insufficient documentation

## 2013-08-21 DIAGNOSIS — Z950 Presence of cardiac pacemaker: Secondary | ICD-10-CM | POA: Insufficient documentation

## 2013-08-21 DIAGNOSIS — J449 Chronic obstructive pulmonary disease, unspecified: Secondary | ICD-10-CM | POA: Insufficient documentation

## 2013-08-21 DIAGNOSIS — I509 Heart failure, unspecified: Secondary | ICD-10-CM | POA: Insufficient documentation

## 2013-08-21 DIAGNOSIS — Z7982 Long term (current) use of aspirin: Secondary | ICD-10-CM | POA: Insufficient documentation

## 2013-08-21 DIAGNOSIS — Z9981 Dependence on supplemental oxygen: Secondary | ICD-10-CM | POA: Insufficient documentation

## 2013-08-21 DIAGNOSIS — K219 Gastro-esophageal reflux disease without esophagitis: Secondary | ICD-10-CM | POA: Insufficient documentation

## 2013-08-21 DIAGNOSIS — I2589 Other forms of chronic ischemic heart disease: Secondary | ICD-10-CM | POA: Insufficient documentation

## 2013-08-21 DIAGNOSIS — I739 Peripheral vascular disease, unspecified: Secondary | ICD-10-CM | POA: Insufficient documentation

## 2013-08-21 DIAGNOSIS — E785 Hyperlipidemia, unspecified: Secondary | ICD-10-CM | POA: Insufficient documentation

## 2013-08-21 HISTORY — PX: ESOPHAGOGASTRODUODENOSCOPY: SHX5428

## 2013-08-21 LAB — CBC WITH DIFFERENTIAL/PLATELET
BASOS ABS: 0 10*3/uL (ref 0.0–0.1)
Basophils Relative: 0 % (ref 0–1)
EOS PCT: 0 % (ref 0–5)
Eosinophils Absolute: 0 10*3/uL (ref 0.0–0.7)
HEMATOCRIT: 38.5 % — AB (ref 39.0–52.0)
HEMOGLOBIN: 13.1 g/dL (ref 13.0–17.0)
Lymphocytes Relative: 16 % (ref 12–46)
Lymphs Abs: 1.4 10*3/uL (ref 0.7–4.0)
MCH: 30.9 pg (ref 26.0–34.0)
MCHC: 34 g/dL (ref 30.0–36.0)
MCV: 90.8 fL (ref 78.0–100.0)
MONO ABS: 0.9 10*3/uL (ref 0.1–1.0)
MONOS PCT: 10 % (ref 3–12)
NEUTROS ABS: 6.6 10*3/uL (ref 1.7–7.7)
Neutrophils Relative %: 74 % (ref 43–77)
Platelets: 193 10*3/uL (ref 150–400)
RBC: 4.24 MIL/uL (ref 4.22–5.81)
RDW: 16.1 % — AB (ref 11.5–15.5)
WBC: 8.9 10*3/uL (ref 4.0–10.5)

## 2013-08-21 LAB — COMPREHENSIVE METABOLIC PANEL
ALT: 14 U/L (ref 0–53)
AST: 23 U/L (ref 0–37)
Albumin: 3.3 g/dL — ABNORMAL LOW (ref 3.5–5.2)
Alkaline Phosphatase: 88 U/L (ref 39–117)
BUN: 15 mg/dL (ref 6–23)
CALCIUM: 8.9 mg/dL (ref 8.4–10.5)
CO2: 26 mEq/L (ref 19–32)
Chloride: 99 mEq/L (ref 96–112)
Creatinine, Ser: 1 mg/dL (ref 0.50–1.35)
GFR calc Af Amer: 83 mL/min — ABNORMAL LOW (ref >90)
GFR calc non Af Amer: 72 mL/min — ABNORMAL LOW (ref >90)
Glucose, Bld: 124 mg/dL — ABNORMAL HIGH (ref 70–99)
Potassium: 4 mEq/L (ref 3.7–5.3)
Sodium: 138 mEq/L (ref 137–147)
Total Bilirubin: 0.6 mg/dL (ref 0.3–1.2)
Total Protein: 8 g/dL (ref 6.0–8.3)

## 2013-08-21 LAB — PROTIME-INR
INR: 1.22 (ref 0.00–1.49)
Prothrombin Time: 15.1 seconds (ref 11.6–15.2)

## 2013-08-21 LAB — POCT I-STAT TROPONIN I: Troponin i, poc: 0.02 ng/mL (ref 0.00–0.08)

## 2013-08-21 LAB — LIPASE, BLOOD: LIPASE: 14 U/L (ref 11–59)

## 2013-08-21 LAB — OCCULT BLOOD, POC DEVICE: Fecal Occult Bld: NEGATIVE

## 2013-08-21 SURGERY — EGD (ESOPHAGOGASTRODUODENOSCOPY)
Anesthesia: Moderate Sedation

## 2013-08-21 MED ORDER — ONDANSETRON HCL 4 MG/2ML IJ SOLN
4.0000 mg | Freq: Once | INTRAMUSCULAR | Status: DC
  Filled 2013-08-21: qty 2

## 2013-08-21 MED ORDER — FENTANYL CITRATE 0.05 MG/ML IJ SOLN
INTRAMUSCULAR | Status: AC
  Filled 2013-08-21: qty 2

## 2013-08-21 MED ORDER — MIDAZOLAM HCL 5 MG/ML IJ SOLN
INTRAMUSCULAR | Status: AC
  Filled 2013-08-21: qty 1

## 2013-08-21 MED ORDER — SODIUM CHLORIDE 0.9 % IV SOLN: INTRAVENOUS | Status: DC

## 2013-08-21 MED ORDER — BUTAMBEN-TETRACAINE-BENZOCAINE 2-2-14 % EX AERO
INHALATION_SPRAY | CUTANEOUS | Status: DC | PRN
  Administered 2013-08-21: 2 via TOPICAL

## 2013-08-21 MED ORDER — SODIUM CHLORIDE 0.9 % IV BOLUS (SEPSIS)
1000.0000 mL | Freq: Once | INTRAVENOUS | Status: AC
  Administered 2013-08-21: 1000 mL via INTRAVENOUS

## 2013-08-21 MED ORDER — PANTOPRAZOLE SODIUM 40 MG IV SOLR
40.0000 mg | Freq: Once | INTRAVENOUS | Status: AC
  Administered 2013-08-21: 40 mg via INTRAVENOUS
  Filled 2013-08-21: qty 40

## 2013-08-21 MED ORDER — MIDAZOLAM HCL 10 MG/2ML IJ SOLN
INTRAMUSCULAR | Status: DC | PRN
  Administered 2013-08-21 (×2): 1 mg via INTRAVENOUS

## 2013-08-21 MED ORDER — FENTANYL CITRATE 0.05 MG/ML IJ SOLN
INTRAMUSCULAR | Status: DC | PRN
  Administered 2013-08-21 (×2): 12.5 ug via INTRAVENOUS

## 2013-08-21 NOTE — Consult Note (Signed)
Reason for Consult: Upper GI bleed Referring Physician: ER physician  Bruce Taylor is an 73 y.o. male.  HPI: Patient known to me for years for her various GI issues lately has been constipation however he is on Coumadin and an aspirin a day and no pump inhibitor and threw up some black material earlier today and came to the emergency room and his hemoglobin was stable and his BUN and creatinine and INR were okay and I was consulted for further workup and plans and he has been moving his bowels adequately with an occasional blowout but no blood or black stools and his previous workup was reviewed and his case was discussed with his family as well  Past Medical History  Diagnosis Date  . Hyperlipidemia   . Hypertension   . 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 Chronic atrial fibrillation   . Ischemic cardiomyopathy     EF 45% by echo 2010  . COPD (chronic obstructive pulmonary disease) 02/04/2010  . On home O2     "24/7"  . Renal insufficiency   . Tremor   . Angina pectoris   . Obesity, morbid 06/15/2011  . Coronary artery disease     Multiple PCIs  . Myocardial infarct ? 1995  . Blood transfusion ?2011    "hemorrhaging out my penis" (07/13/2012)  . Anemia   . Ulcer     "don't know what kind; it was years ago"  . CHF (congestive heart failure) 04/29/2009    2D Echo - EF 45-50%, normal  . Shortness of breath     "all the time" (07/13/2012)  . Obstructive sleep apnea on CPAP   . Type II diabetes mellitus   . GERD (gastroesophageal reflux disease)   . Stomach ulcer 1980's  . Arthritis     "lower back" (07/13/2012)  . Chronic lower back pain   . Hematuria ? 2011  . PAD (peripheral artery disease) 05/09/2012    Lower Arterial Exam - Right and left distal external iliac arteries-equal to or less than 50% diameter reductions, Bilateral SFA-suggestive 50-69% diameter reduction,right posterior tibial artery and left peroneal artery both occluded  . Bruit 07/23/2010   Carotid Duplex - Right and left ICAs demonstrate small amount of fibrous plaque not hemodynamically significant  . Complication of anesthesia     pt states was very difficult to wake up  . Pacemaker 2010    Past Surgical History  Procedure Laterality Date  . Coronary stent placement      x 8  . Back surgery      x3  . Coronary angioplasty    . Coronary stent placement  10/2010    Has stents in LAD, RCA,LCX  . Coronary angioplasty with stent placement      "? total of 9 stents over the years; 6 at one time"  . Tonsillectomy      "I was a little boy" (07/13/2012)  . Appendectomy  1954  . Cataract extraction w/ intraocular lens  implant, bilateral  ~ 2011  . Insert / replace / remove pacemaker  07/2008  . Lumbar laminectomy  1990's    "had an extra lower lumbar; had it taken out" (07/13/2012)  . Lumbar disc surgery  1990's X    "cleaned up mess from 1st then 2nd OR" (07/13/2012)  . Hemorrhoid surgery      "in the dr's office; don't know if they cut them out or banded them"  . Transesophageal  echocardiogram  11/13/2004    EF 45-50%, no evidence of intracardiac mass or thrombus, does appear to be a small AST with mild left-to-right and right-to-left shunting  . Cardiac catheterization  06/16/2011    RCA 90% stenosis 3.5x18mm noncompliant Trek balloon was used for high-pressure noncompliant dilation up to approximately 3.51 proximally and 3.4 distally entire segment was reduced to less than 5%  . Cardiac catheterization  10/27/2010    95% stenosis of the distal RCA, 3x52mm bare-metal non-DES Medtronic Integrity stent to overlap stenosis, post-dilated with a 3.5x21 [place] Sprinter balloon to 3.8mm. As balloon was pulled back, there was progressive increase in dilation to 3.29mm  . Cardiac catheterization  10/26/2010    RCA - discrete 95% proximal in-stent restenosis, mid vessel 80%discrete stenosis;   . Cardiac catheterization  07/30/2008    2x26mm angioscope balloon cuts 3 to 5 atmospheric pressure   . Flexible sigmoidoscopy N/A 04/09/2013    Procedure: FLEXIBLE SIGMOIDOSCOPY;  Surgeon: Jeryl Columbia, MD;  Location: [facility];  Service: Endoscopy;  Laterality: N/A;    Family History  Problem Relation Age of Onset  . Heart disease Father   . Heart disease Brother   . Uterine cancer Mother   . Diabetes Sister     Social History:  reports that he quit smoking about 7 years ago. His smoking use included Cigarettes. He has a 104 pack-year smoking history. He has never used smokeless tobacco. He reports that he drinks alcohol. He reports that he does not use illicit drugs.  Allergies:  Allergies  Allergen Reactions  . Penicillins Anaphylaxis and Swelling    "swell up all over my body; throat mainly, I think" (07/13/2012)  . Chlorhexidine Gluconate Itching    Chg cloth "don't remember how bad it was" (07/13/2012)    Medications: I have reviewed the patient's current medications.  Results for orders placed during the hospital encounter of 08/21/13 (from the past 48 hour(s))  COMPREHENSIVE METABOLIC PANEL     Status: Abnormal   Collection Time    08/21/13 10:37 AM      Result Value Range   Sodium 138  137 - 147 mEq/L   Potassium 4.0  3.7 - 5.3 mEq/L   Chloride 99  96 - 112 mEq/L   CO2 26  19 - 32 mEq/L   Glucose, Bld 124 (*) 70 - 99 mg/dL   BUN 15  6 - 23 mg/dL   Creatinine, Ser 1.00  0.50 - 1.35 mg/dL   Calcium 8.9  8.4 - 10.5 mg/dL   Total Protein 8.0  6.0 - 8.3 g/dL   Albumin 3.3 (*) 3.5 - 5.2 g/dL   AST 23  0 - 37 U/L   ALT 14  0 - 53 U/L   Alkaline Phosphatase 88  39 - 117 U/L   Total Bilirubin 0.6  0.3 - 1.2 mg/dL   GFR calc non Af Amer 72 (*) >90 mL/min   GFR calc Af Amer 83 (*) >90 mL/min   Comment: (NOTE)     The eGFR has been calculated using the CKD EPI equation.     This calculation has not been validated in all clinical situations.     eGFR's persistently <90 mL/min signify possible Chronic Kidney     Disease.  LIPASE, BLOOD     Status: None    Collection Time    08/21/13 10:37 AM      Result Value Range   Lipase 14  11 - 59 U/L  PROTIME-INR     Status: None   Collection Time    08/21/13 10:47 AM      Result Value Range   Prothrombin Time 15.1  11.6 - 15.2 seconds   INR 1.22  0.00 - 1.49  POCT I-STAT TROPONIN I     Status: None   Collection Time    08/21/13 10:54 AM      Result Value Range   Troponin i, poc 0.02  0.00 - 0.08 ng/mL   Comment 3            Comment: Due to the release kinetics of cTnI,     a negative result within the first hours     of the onset of symptoms does not rule out     myocardial infarction with certainty.     If myocardial infarction is still suspected,     repeat the test at appropriate intervals.  CBC WITH DIFFERENTIAL     Status: Abnormal   Collection Time    08/21/13 12:32 PM      Result Value Range   WBC 8.9  4.0 - 10.5 K/uL   RBC 4.24  4.22 - 5.81 MIL/uL   Hemoglobin 13.1  13.0 - 17.0 g/dL   HCT 38.5 (*) 39.0 - 52.0 %   MCV 90.8  78.0 - 100.0 fL   MCH 30.9  26.0 - 34.0 pg   MCHC 34.0  30.0 - 36.0 g/dL   RDW 16.1 (*) 11.5 - 15.5 %   Platelets 193  150 - 400 K/uL   Neutrophils Relative % 74  43 - 77 %   Neutro Abs 6.6  1.7 - 7.7 K/uL   Lymphocytes Relative 16  12 - 46 %   Lymphs Abs 1.4  0.7 - 4.0 K/uL   Monocytes Relative 10  3 - 12 %   Monocytes Absolute 0.9  0.1 - 1.0 K/uL   Eosinophils Relative 0  0 - 5 %   Eosinophils Absolute 0.0  0.0 - 0.7 K/uL   Basophils Relative 0  0 - 1 %   Basophils Absolute 0.0  0.0 - 0.1 K/uL  OCCULT BLOOD, POC DEVICE     Status: None   Collection Time    08/21/13  1:33 PM      Result Value Range   Fecal Occult Bld NEGATIVE  NEGATIVE    Dg Abd Acute W/chest  08/21/2013   CLINICAL DATA:  Nausea and vomiting, left abdominal pain  EXAM: ACUTE ABDOMEN SERIES (ABDOMEN 2 VIEW & CHEST 1 VIEW)  COMPARISON:  08/09/2013  FINDINGS: Cardiomegaly is noted. Single lead cardiac pacemaker in place. Streaky bilateral basilar atelectasis or scarring. There is  gaseous distension of the stomach. Nonobstructive bowel gas pattern. No free abdominal air.  IMPRESSION: No acute disease within chest. Gaseous distension of the stomach. No free abdominal air. Nonobstructive bowel gas pattern.   Electronically Signed   By: Lahoma Crocker M.D.   On: 08/21/2013 10:32    ROS the patient does not have any upper tract symptoms except for an occasional neck dysphasia ever since he had an NG tube last fall Blood pressure 115/62, pulse 55, temperature 98 F (36.7 C), temperature source Oral, resp. rate 19, SpO2 93.00%. Physical Exam vital signs stable afebrile no acute distress exam please see pre-assessment labs reviewed  Assessment/Plan: Upper GI bleeding in a patient on aspirin and Coumadin and multiple medical problem Plan: The risks benefits and methods were discussed with the patient and  will proceed with endoscopy today with further workup and plans and possible hospital admission pending those findings but my guess is she will have an ulcer in need long-term pump inhibitors and probable reevaluation of whether he needs both aspirin and Coumadin by cardiology [facility] E 08/21/2013, 3:53 PM

## 2013-08-21 NOTE — ED Notes (Addendum)
[facility] with c/o nausea and vomiting starting at 2 am this morning.  Pt states abdominal pain and chest pain starting at the same time.  Vomiting appears very dark per EMS.  Pt given 8 mg Zofran IV and 1000 mL NS bolus.  Pt in NAD, A&O.

## 2013-08-21 NOTE — ED Provider Notes (Signed)
CSN: [id number]     Arrival date & time 08/21/13  [id number] History   First MD Initiated Contact with Patient 08/21/13 [phone]     Chief Complaint  Patient presents with  . Chest Pain  . Abdominal Pain   (Consider location/radiation/quality/duration/timing/severity/associated sxs/prior Treatment) HPI  This is a 73 year old male with a history of atrial fibrillation, cardiomyopathy, COPD, complete heart block status post pacemaker, and renal insufficiency who presents with nausea and vomiting. Patient reports onset of symptoms this morning at 2 AM. He reports vomiting up "coffee-looking stuff." He denies any bright red blood in his vomit. He denies any dark stools or blood in stools. Patient reports he has a history of this in the past. His GI doctor is Dr. Ewing SchleinMagod.  Patient also reports epigastric abdominal pain that radiates into his chest. He states "it feels like nausea."  He denies any pressure-like chest pain or worsening with exertion.  Past Medical History  Diagnosis Date  . Hyperlipidemia   . Hypertension   . 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 Chronic atrial fibrillation   . Ischemic cardiomyopathy     EF 45% by echo 2010  . COPD (chronic obstructive pulmonary disease) 02/04/2010  . On home O2     "24/7"  . Renal insufficiency   . Tremor   . Angina pectoris   . Obesity, morbid 06/15/2011  . Coronary artery disease     Multiple PCIs  . Myocardial infarct ? 1995  . Blood transfusion ?2011    "hemorrhaging out my penis" (07/13/2012)  . Anemia   . Ulcer     "don't know what kind; it was years ago"  . CHF (congestive heart failure) 04/29/2009    2D Echo - EF 45-50%, normal  . Shortness of breath     "all the time" (07/13/2012)  . Obstructive sleep apnea on CPAP   . Type II diabetes mellitus   . GERD (gastroesophageal reflux disease)   . Stomach ulcer 1980's  . Arthritis     "lower back" (07/13/2012)  . Chronic lower back pain   . Hematuria ? 2011  . PAD (peripheral  artery disease) 05/09/2012    Lower Arterial Exam - Right and left distal external iliac arteries-equal to or less than 50% diameter reductions, Bilateral SFA-suggestive 50-69% diameter reduction,right posterior tibial artery and left peroneal artery both occluded  . Bruit 07/23/2010    Carotid Duplex - Right and left ICAs demonstrate small amount of fibrous plaque not hemodynamically significant  . Complication of anesthesia     pt states was very difficult to wake up  . Pacemaker 2010   Past Surgical History  Procedure Laterality Date  . Coronary stent placement      x 8  . Back surgery      x3  . Coronary angioplasty    . Coronary stent placement  10/2010    Has stents in LAD, RCA,LCX  . Coronary angioplasty with stent placement      "? total of 9 stents over the years; 6 at one time"  . Tonsillectomy      "I was a little boy" (07/13/2012)  . Appendectomy  1954  . Cataract extraction w/ intraocular lens  implant, bilateral  ~ 2011  . Insert / replace / remove pacemaker  07/2008  . Lumbar laminectomy  1990's    "had an extra lower lumbar; had it taken out" (07/13/2012)  . Lumbar disc surgery  1990's X    "cleaned up mess from 1st then 2nd OR" (07/13/2012)  . Hemorrhoid surgery      "in the dr's office; don't know if they cut them out or banded them"  . Transesophageal echocardiogram  11/13/2004    EF 45-50%, no evidence of intracardiac mass or thrombus, does appear to be a small AST with mild left-to-right and right-to-left shunting  . Cardiac catheterization  06/16/2011    RCA 90% stenosis 3.5x5mm noncompliant Trek balloon was used for high-pressure noncompliant dilation up to approximately 3.51 proximally and 3.4 distally entire segment was reduced to less than 5%  . Cardiac catheterization  10/27/2010    95% stenosis of the distal RCA, 3x52mm bare-metal non-DES Medtronic Integrity stent to overlap stenosis, post-dilated with a 3.5x21 [place] Sprinter balloon to 3.32mm. As balloon was  pulled back, there was progressive increase in dilation to 3.76mm  . Cardiac catheterization  10/26/2010    RCA - discrete 95% proximal in-stent restenosis, mid vessel 80%discrete stenosis;   . Cardiac catheterization  07/30/2008    2x78mm angioscope balloon cuts 3 to 5 atmospheric pressure  . Flexible sigmoidoscopy N/A 04/09/2013    Procedure: FLEXIBLE SIGMOIDOSCOPY;  Surgeon: Petra Kuba, MD;  Location: [facility];  Service: Endoscopy;  Laterality: N/A;   Family History  Problem Relation Age of Onset  . Heart disease Father   . Heart disease Brother   . Uterine cancer Mother   . Diabetes Sister    History  Substance Use Topics  . Smoking status: Former Smoker -- 2.00 packs/day for 52 years    Types: Cigarettes    Quit date: 07/26/2006  . Smokeless tobacco: Never Used  . Alcohol Use: Yes     Comment: 07/13/2012"did drink at one time for 5 years; stopped ~ 1980"    Review of Systems  Constitutional: Negative.  Negative for fever.  Respiratory: Negative.  Negative for chest tightness and shortness of breath.   Cardiovascular: Positive for chest pain.  Gastrointestinal: Positive for nausea, vomiting and abdominal pain.       Coffee-ground emesis  Genitourinary: Negative.  Negative for dysuria.  Musculoskeletal: Negative for back pain.  Skin: Negative for rash.  Neurological: Negative for light-headedness and headaches.  All other systems reviewed and are negative.    Allergies  Penicillins and Chlorhexidine gluconate  Home Medications   No current outpatient prescriptions on file. BP 116/51  Pulse 55  Temp(Src) 98 F (36.7 C) (Oral)  Resp 17  SpO2 97% Physical Exam  Nursing note and vitals reviewed. Constitutional: He is oriented to person, place, and time. No distress.  Elderly  HENT:  Head: Normocephalic and atraumatic.  Eyes: Pupils are equal, round, and reactive to light.  Neck: Neck supple. No JVD present.  Cardiovascular: Normal rate, regular rhythm and  normal heart sounds.   No murmur heard. Pulmonary/Chest: Effort normal and breath sounds normal. No respiratory distress. He has no wheezes. He exhibits no tenderness.  Abdominal: Soft. Bowel sounds are normal. There is tenderness. There is no rebound.  Mild tenderness to palpation of the epigastrium without rebound or guarding  Musculoskeletal: He exhibits no edema.  Neurological: He is alert and oriented to person, place, and time.  Skin: Skin is warm and dry.  Psychiatric: He has a normal mood and affect.    ED Course  Procedures (including critical care time) Labs Review Labs Reviewed  COMPREHENSIVE METABOLIC PANEL - Abnormal; Notable for the following:    Glucose, Bld 124 (*)  Albumin 3.3 (*)    GFR calc non Af Amer 72 (*)    GFR calc Af Amer 83 (*)    All other components within normal limits  CBC WITH DIFFERENTIAL - Abnormal; Notable for the following:    HCT 38.5 (*)    RDW 16.1 (*)    All other components within normal limits  LIPASE, BLOOD  PROTIME-INR  CBC WITH DIFFERENTIAL  OCCULT BLOOD GASTRIC / DUODENUM (SPECIMEN CUP)  POCT I-STAT TROPONIN I  OCCULT BLOOD, POC DEVICE   Imaging Review Dg Abd Acute W/chest  08/21/2013   CLINICAL DATA:  Nausea and vomiting, left abdominal pain  EXAM: ACUTE ABDOMEN SERIES (ABDOMEN 2 VIEW & CHEST 1 VIEW)  COMPARISON:  08/09/2013  FINDINGS: Cardiomegaly is noted. Single lead cardiac pacemaker in place. Streaky bilateral basilar atelectasis or scarring. There is gaseous distension of the stomach. Nonobstructive bowel gas pattern. No free abdominal air.  IMPRESSION: No acute disease within chest. Gaseous distension of the stomach. No free abdominal air. Nonobstructive bowel gas pattern.   Electronically Signed   By: Natasha Mead M.D.   On: 08/21/2013 10:32    EKG Interpretation    Date/Time:  Tuesday August 21 2013 09:30:55 EST Ventricular Rate:  64 PR Interval:  174 QRS Duration: 165 QT Interval:  510 QTC Calculation: 526 R  Axis:   -94 Text Interpretation:  PAced rhythm Confirmed by HORTON  MD, COURTNEY ([id number]) on 08/21/2013 10:01:15 AM            MDM  No diagnosis found.  Patient presents with coffee-ground emesis.  He is nontoxic-appearing on exam. Initial vital signs are reassuring. Patient has tenderness to palpation of the epigastrium with a history of peptic ulcer disease.  Patient was given Zofran in route and has had no further episodes of emesis. Lab work is remarkable for hematocrit of 38.5 which is close to the patient's baseline. EKG and troponin are negative. Patient is subtherapeutic with an INR 1.22. He is unable to provide a gastric ulcer antral. Hemoccult is negative from below.  Patient is not orthostatic.  Spoke with Dr. Ewing Schlein who plans to perform an endoscopy on the patient this afternoon. If findings are benign, patient will be discharged from endoscopy.    Shon Baton, MD 08/21/13 [phone]

## 2013-08-21 NOTE — Discharge Instructions (Addendum)
Call if question or problem from a GI standpoint otherwise may resume Coumadin but hold aspirin until you to call Dr. Tresa EndoKelly tomorrow to see if it is safe to stop either aspirin or Coumadin or both in the meantime will begin omeprazole once a day and have liquids only tonight and may slowly advance diet tomorrow if doing well otherwise keep your appointment with me in a few weeks

## 2013-08-21 NOTE — Op Note (Signed)
[facility] [address] [place], [id number]   ENDOSCOPY PROCEDURE REPORT  PATIENT: Bruce Taylor, Bruce E.  MR#: [id number] BIRTHDATE: Aug 18, 1940 , 72  yrs. old GENDER: Male  ENDOSCOPIST: Vida RiggerMarc Savien Mamula, MD REFERRED BY:   ER physician  PROCEDURE DATE:  08/21/2013 PROCEDURE:   EGD, diagnostic ASA CLASS:   Class III INDICATIONS:Hematemesis. in patient on aspirin and Coumadin  MEDICATIONS: Fentanyl 25 mcg IV and Versed 2 mg IV  TOPICAL ANESTHETIC:used  DESCRIPTION OF PROCEDURE:   After the risks benefits and alternatives of the procedure were thoroughly explained, informed consent was obtained.  The Pentax Gastroscope Peds [id number] endoscope was introduced through the mouth and advanced to the second portion of the duodenum , limited by Without limitations. The instrument was slowly withdrawn as the mucosa was fully examined.there was no signs of active bleeding and the findings are recorded below         FINDINGS:1 moderate distal esophagitis minimal esophagitis 2. old Food in the stomach without signs of bleeding #3 few bulb erosions 4. Otherwise within normal limits EGD  COMPLICATIONS:none  ENDOSCOPIC IMPRESSION:above   RECOMMENDATIONS:will call in omeprazole 40 a day and he has an appointment with me in a few weeks and the family knows to call sooner when necessary however I will ask them to discuss his Coumadin and aspirin with his cardiologist to see if he truly needs both and will leave admission up to ER physician   REPEAT EXAM: as needed   _______________________________ Vida RiggerMarc Malekai Markwood, MD eSigned:  Vida RiggerMarc Crosley Stejskal, MD 08/21/2013 4:24 PM    XB:[id number]:Thomas Tresa EndoKelly, MD  PATIENT NAME:  Bruce Taylor, Bruce E. MR#: [id number]

## 2013-08-21 NOTE — ED Notes (Signed)
Patient transported to X-ray 

## 2013-08-22 ENCOUNTER — Encounter (HOSPITAL_COMMUNITY): Payer: Self-pay | Admitting: Gastroenterology

## 2013-08-23 ENCOUNTER — Telehealth: Payer: Self-pay | Admitting: *Deleted

## 2013-08-23 LAB — POCT INR: INR: 1.3

## 2013-08-23 NOTE — Telephone Encounter (Signed)
Message forwarded to K. Alvstad, PharmD.  

## 2013-08-23 NOTE — Telephone Encounter (Signed)
[name] with [facility] was calling in [name] PT 16.1 and INR 1.3. NO missed doses but ate broccoli. His current dose is 9 mg daily.  TK

## 2013-08-24 ENCOUNTER — Ambulatory Visit (INDEPENDENT_AMBULATORY_CARE_PROVIDER_SITE_OTHER): Payer: Medicare Other | Admitting: Pharmacist Clinician (PhC)/ Clinical Pharmacy Specialist

## 2013-08-24 DIAGNOSIS — I4891 Unspecified atrial fibrillation: Secondary | ICD-10-CM

## 2013-08-24 DIAGNOSIS — I482 Chronic atrial fibrillation, unspecified: Secondary | ICD-10-CM

## 2013-08-24 DIAGNOSIS — Z7901 Long term (current) use of anticoagulants: Secondary | ICD-10-CM

## 2013-08-24 NOTE — Telephone Encounter (Signed)
See anticoag note

## 2013-08-31 ENCOUNTER — Ambulatory Visit (INDEPENDENT_AMBULATORY_CARE_PROVIDER_SITE_OTHER): Payer: Medicare Other | Admitting: Pharmacist Clinician (PhC)/ Clinical Pharmacy Specialist

## 2013-08-31 LAB — POCT INR: INR: 1.7

## 2013-09-06 ENCOUNTER — Ambulatory Visit (INDEPENDENT_AMBULATORY_CARE_PROVIDER_SITE_OTHER): Payer: Medicare Other | Admitting: Pharmacist Clinician (PhC)/ Clinical Pharmacy Specialist

## 2013-09-06 LAB — POCT INR: INR: 3.1

## 2013-09-13 ENCOUNTER — Ambulatory Visit (INDEPENDENT_AMBULATORY_CARE_PROVIDER_SITE_OTHER): Payer: Medicare Other | Admitting: Pharmacist Clinician (PhC)/ Clinical Pharmacy Specialist

## 2013-09-13 LAB — POCT INR: INR: 6.2

## 2013-09-14 ENCOUNTER — Telehealth: Payer: Self-pay | Admitting: Cardiovascular Disease

## 2013-09-14 NOTE — Telephone Encounter (Signed)
FYI to Dr. Tresa EndoKelly.

## 2013-09-14 NOTE — Telephone Encounter (Signed)
Missed therapy this week and ond day last week , because everyone within household is sick and daughter ask them not to come because of that reason .  Thanks

## 2013-09-17 ENCOUNTER — Emergency Department (HOSPITAL_COMMUNITY): Payer: Medicare Other

## 2013-09-17 ENCOUNTER — Inpatient Hospital Stay (HOSPITAL_COMMUNITY)
Admission: EM | Admit: 2013-09-17 | Discharge: 2013-09-24 | DRG: 291 | Disposition: A | Discharge status: Skilled Nursing Facility | Payer: Medicare Other | Attending: Internal Medicine | Admitting: Internal Medicine

## 2013-09-17 ENCOUNTER — Encounter (HOSPITAL_COMMUNITY): Payer: Self-pay | Admitting: Emergency Medicine

## 2013-09-17 ENCOUNTER — Ambulatory Visit (INDEPENDENT_AMBULATORY_CARE_PROVIDER_SITE_OTHER): Payer: Medicare Other | Admitting: Pharmacist Clinician (PhC)/ Clinical Pharmacy Specialist

## 2013-09-17 DIAGNOSIS — Z6833 Body mass index (BMI) 33.0-33.9, adult: Secondary | ICD-10-CM

## 2013-09-17 DIAGNOSIS — M129 Arthropathy, unspecified: Secondary | ICD-10-CM | POA: Diagnosis present

## 2013-09-17 DIAGNOSIS — J9600 Acute respiratory failure, unspecified whether with hypoxia or hypercapnia: Secondary | ICD-10-CM | POA: Diagnosis present

## 2013-09-17 DIAGNOSIS — Z87891 Personal history of nicotine dependence: Secondary | ICD-10-CM

## 2013-09-17 DIAGNOSIS — F3289 Other specified depressive episodes: Secondary | ICD-10-CM | POA: Diagnosis present

## 2013-09-17 DIAGNOSIS — I2789 Other specified pulmonary heart diseases: Secondary | ICD-10-CM | POA: Diagnosis present

## 2013-09-17 DIAGNOSIS — L03319 Cellulitis of trunk, unspecified: Secondary | ICD-10-CM

## 2013-09-17 DIAGNOSIS — E871 Hypo-osmolality and hyponatremia: Secondary | ICD-10-CM

## 2013-09-17 DIAGNOSIS — K219 Gastro-esophageal reflux disease without esophagitis: Secondary | ICD-10-CM | POA: Diagnosis present

## 2013-09-17 DIAGNOSIS — M722 Plantar fascial fibromatosis: Secondary | ICD-10-CM

## 2013-09-17 DIAGNOSIS — J449 Chronic obstructive pulmonary disease, unspecified: Secondary | ICD-10-CM | POA: Diagnosis present

## 2013-09-17 DIAGNOSIS — Z794 Long term (current) use of insulin: Secondary | ICD-10-CM

## 2013-09-17 DIAGNOSIS — I739 Peripheral vascular disease, unspecified: Secondary | ICD-10-CM | POA: Diagnosis present

## 2013-09-17 DIAGNOSIS — J9620 Acute and chronic respiratory failure, unspecified whether with hypoxia or hypercapnia: Secondary | ICD-10-CM | POA: Diagnosis present

## 2013-09-17 DIAGNOSIS — I482 Chronic atrial fibrillation, unspecified: Secondary | ICD-10-CM | POA: Diagnosis present

## 2013-09-17 DIAGNOSIS — J9610 Chronic respiratory failure, unspecified whether with hypoxia or hypercapnia: Secondary | ICD-10-CM

## 2013-09-17 DIAGNOSIS — I4891 Unspecified atrial fibrillation: Secondary | ICD-10-CM | POA: Diagnosis present

## 2013-09-17 DIAGNOSIS — Z950 Presence of cardiac pacemaker: Secondary | ICD-10-CM

## 2013-09-17 DIAGNOSIS — K5900 Constipation, unspecified: Secondary | ICD-10-CM | POA: Diagnosis present

## 2013-09-17 DIAGNOSIS — Z7901 Long term (current) use of anticoagulants: Secondary | ICD-10-CM

## 2013-09-17 DIAGNOSIS — Z8249 Family history of ischemic heart disease and other diseases of the circulatory system: Secondary | ICD-10-CM

## 2013-09-17 DIAGNOSIS — Z9981 Dependence on supplemental oxygen: Secondary | ICD-10-CM

## 2013-09-17 DIAGNOSIS — E118 Type 2 diabetes mellitus with unspecified complications: Secondary | ICD-10-CM | POA: Diagnosis present

## 2013-09-17 DIAGNOSIS — J439 Emphysema, unspecified: Secondary | ICD-10-CM | POA: Diagnosis present

## 2013-09-17 DIAGNOSIS — L02219 Cutaneous abscess of trunk, unspecified: Secondary | ICD-10-CM

## 2013-09-17 DIAGNOSIS — I214 Non-ST elevation (NSTEMI) myocardial infarction: Secondary | ICD-10-CM

## 2013-09-17 DIAGNOSIS — Z9189 Other specified personal risk factors, not elsewhere classified: Secondary | ICD-10-CM

## 2013-09-17 DIAGNOSIS — N179 Acute kidney failure, unspecified: Secondary | ICD-10-CM | POA: Diagnosis present

## 2013-09-17 DIAGNOSIS — Z9861 Coronary angioplasty status: Secondary | ICD-10-CM

## 2013-09-17 DIAGNOSIS — E785 Hyperlipidemia, unspecified: Secondary | ICD-10-CM

## 2013-09-17 DIAGNOSIS — I4510 Unspecified right bundle-branch block: Secondary | ICD-10-CM | POA: Diagnosis present

## 2013-09-17 DIAGNOSIS — I10 Essential (primary) hypertension: Secondary | ICD-10-CM | POA: Diagnosis present

## 2013-09-17 DIAGNOSIS — I509 Heart failure, unspecified: Secondary | ICD-10-CM | POA: Diagnosis present

## 2013-09-17 DIAGNOSIS — J4489 Other specified chronic obstructive pulmonary disease: Secondary | ICD-10-CM | POA: Diagnosis present

## 2013-09-17 DIAGNOSIS — F329 Major depressive disorder, single episode, unspecified: Secondary | ICD-10-CM | POA: Diagnosis present

## 2013-09-17 DIAGNOSIS — G4733 Obstructive sleep apnea (adult) (pediatric): Secondary | ICD-10-CM

## 2013-09-17 DIAGNOSIS — E43 Unspecified severe protein-calorie malnutrition: Secondary | ICD-10-CM

## 2013-09-17 DIAGNOSIS — R0902 Hypoxemia: Secondary | ICD-10-CM

## 2013-09-17 DIAGNOSIS — G250 Essential tremor: Secondary | ICD-10-CM

## 2013-09-17 DIAGNOSIS — K5909 Other constipation: Secondary | ICD-10-CM

## 2013-09-17 DIAGNOSIS — R5381 Other malaise: Secondary | ICD-10-CM

## 2013-09-17 DIAGNOSIS — I2589 Other forms of chronic ischemic heart disease: Secondary | ICD-10-CM | POA: Diagnosis present

## 2013-09-17 DIAGNOSIS — I252 Old myocardial infarction: Secondary | ICD-10-CM

## 2013-09-17 DIAGNOSIS — I5033 Acute on chronic diastolic (congestive) heart failure: Principal | ICD-10-CM

## 2013-09-17 DIAGNOSIS — J189 Pneumonia, unspecified organism: Secondary | ICD-10-CM | POA: Diagnosis not present

## 2013-09-17 DIAGNOSIS — R079 Chest pain, unspecified: Secondary | ICD-10-CM

## 2013-09-17 DIAGNOSIS — I2510 Atherosclerotic heart disease of native coronary artery without angina pectoris: Secondary | ICD-10-CM

## 2013-09-17 DIAGNOSIS — I200 Unstable angina: Secondary | ICD-10-CM | POA: Diagnosis present

## 2013-09-17 DIAGNOSIS — K529 Noninfective gastroenteritis and colitis, unspecified: Secondary | ICD-10-CM

## 2013-09-17 LAB — I-STAT TROPONIN, ED: Troponin i, poc: 0.03 ng/mL (ref 0.00–0.08)

## 2013-09-17 LAB — COMPREHENSIVE METABOLIC PANEL
ALK PHOS: 95 U/L (ref 39–117)
ALT: 10 U/L (ref 0–53)
AST: 20 U/L (ref 0–37)
Albumin: 3.3 g/dL — ABNORMAL LOW (ref 3.5–5.2)
BILIRUBIN TOTAL: 0.5 mg/dL (ref 0.3–1.2)
BUN: 14 mg/dL (ref 6–23)
CHLORIDE: 103 meq/L (ref 96–112)
CO2: 21 meq/L (ref 19–32)
Calcium: 9.1 mg/dL (ref 8.4–10.5)
Creatinine, Ser: 1.27 mg/dL (ref 0.50–1.35)
GFR calc Af Amer: 63 mL/min — ABNORMAL LOW (ref >90)
GFR, EST NON AFRICAN AMERICAN: 54 mL/min — AB (ref >90)
Glucose, Bld: 133 mg/dL — ABNORMAL HIGH (ref 70–99)
POTASSIUM: 4.3 meq/L (ref 3.7–5.3)
SODIUM: 140 meq/L (ref 137–147)
Total Protein: 7.8 g/dL (ref 6.0–8.3)

## 2013-09-17 LAB — CBC
HCT: 37.6 % — ABNORMAL LOW (ref 39.0–52.0)
Hemoglobin: 12.3 g/dL — ABNORMAL LOW (ref 13.0–17.0)
MCH: 30 pg (ref 26.0–34.0)
MCHC: 32.7 g/dL (ref 30.0–36.0)
MCV: 91.7 fL (ref 78.0–100.0)
PLATELETS: 159 10*3/uL (ref 150–400)
RBC: 4.1 MIL/uL — AB (ref 4.22–5.81)
RDW: 16.5 % — ABNORMAL HIGH (ref 11.5–15.5)
WBC: 11 10*3/uL — ABNORMAL HIGH (ref 4.0–10.5)

## 2013-09-17 LAB — LIPASE, BLOOD: LIPASE: 14 U/L (ref 11–59)

## 2013-09-17 LAB — PROTIME-INR
INR: 1.57 — ABNORMAL HIGH (ref 0.00–1.49)
Prothrombin Time: 18.3 seconds — ABNORMAL HIGH (ref 11.6–15.2)

## 2013-09-17 LAB — POCT INR: INR: 1.6

## 2013-09-17 LAB — APTT: aPTT: 42 seconds — ABNORMAL HIGH (ref 24–37)

## 2013-09-17 LAB — MAGNESIUM: Magnesium: 1.7 mg/dL (ref 1.5–2.5)

## 2013-09-17 MED ORDER — IOHEXOL 300 MG/ML  SOLN
25.0000 mL | INTRAMUSCULAR | Status: AC
  Administered 2013-09-17 – 2013-09-18 (×2): 25 mL via ORAL

## 2013-09-17 MED ORDER — ONDANSETRON HCL 4 MG/2ML IJ SOLN
4.0000 mg | Freq: Once | INTRAMUSCULAR | Status: AC
  Administered 2013-09-17: 4 mg via INTRAVENOUS
  Filled 2013-09-17: qty 2

## 2013-09-17 MED ORDER — SODIUM CHLORIDE 0.9 % IV SOLN
1000.0000 mL | INTRAVENOUS | Status: DC
  Administered 2013-09-17: 1000 mL via INTRAVENOUS

## 2013-09-17 MED ORDER — ASPIRIN 81 MG PO CHEW
324.0000 mg | CHEWABLE_TABLET | Freq: Once | ORAL | Status: AC
Start: 2013-09-17 — End: 2013-09-17
  Filled 2013-09-17: qty 4

## 2013-09-17 NOTE — ED Notes (Signed)
RT at bedside due to patient desat to 80's, no rebreather on pt.

## 2013-09-17 NOTE — ED Notes (Signed)
MD asked if patient need for portable bedside Xray. MD states patient is fine to go.

## 2013-09-17 NOTE — ED Notes (Signed)
Unable to place second IV for scan.

## 2013-09-17 NOTE — ED Notes (Signed)
Per EMS- [phone] pt awoke with chest pain, nausea, vomiting. Chest pain to middle of chest, given 324 ASA, zofran, Nitro X 3, 1"paste. Pt reports a decrease of pain to 3/10 after nitro, and a move of chest pain to flank.

## 2013-09-17 NOTE — ED Notes (Signed)
Phlebotomy and RT at bedside to obtain labs and ABG.

## 2013-09-17 NOTE — ED Notes (Signed)
IV team called. 

## 2013-09-17 NOTE — ED Notes (Signed)
Pt daughter called- [phone] [phone], patient lives with daughter. Daughter states her father has had random spouts of irritability and confusion.

## 2013-09-17 NOTE — ED Notes (Signed)
MD notified of pt foul smelling urine and report from daughter of new onset occasional irritability and confusion.

## 2013-09-17 NOTE — ED Notes (Signed)
Pt c/o chest pain, radiating to L chest and L back, accompanied with nausea. Pt states he threw up. Appears clammy to forehead.

## 2013-09-17 NOTE — ED Notes (Signed)
CT called and informed of delay. Pt daughter Aundra MilletMegan called and updated on pt plan of care with patient permission.

## 2013-09-17 NOTE — ED Notes (Signed)
Pt brief changed, large amount of straw colored, foul smelling urine noted.

## 2013-09-17 NOTE — ED Notes (Signed)
RT unable to obtain ABG, RT notified MD.

## 2013-09-17 NOTE — ED Notes (Signed)
Second RN unable to place 2nd IV for scan.

## 2013-09-17 NOTE — ED Provider Notes (Signed)
CSN: [id number]     Arrival date & time 09/17/13  1913 History   First MD Initiated Contact with Patient 09/17/13 1926     Chief Complaint  Patient presents with  . Chest Pain    HPI Patient presents to the emergency room with complaints of chest pain, abdominal pain, nausea and vomiting. Patient states he woke up this evening and started vomiting. Patient states the pain was in the center of his chest seemed to go into his back as well. He also had upper abdominal pain. The patient felt nauseated and vomited once. EMS gave the patient aspirin, nitroglycerin and Zofran. Patient's symptoms improved to a 3/10. Patient now states he's not having any chest pain he just feels achy all over. He denies any recent trouble with diarrhea or constipation. He has not had any fevers. Denies any difficulty urinating. Patient has had similar symptoms in the past when he had trouble with esophagitis. He also has history of coronary artery disease, congestive heart failure and multiple cardiac stent procedures. Past Medical History  Diagnosis Date  . Hyperlipidemia   . Hypertension   . 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 Chronic atrial fibrillation   . Ischemic cardiomyopathy     EF 45% by echo 2010  . COPD (chronic obstructive pulmonary disease) 02/04/2010  . On home O2     "24/7"  . Renal insufficiency   . Tremor   . Angina pectoris   . Obesity, morbid 06/15/2011  . Coronary artery disease     Multiple PCIs  . Myocardial infarct ? 1995  . Blood transfusion ?2011    "hemorrhaging out my penis" (07/13/2012)  . Anemia   . Ulcer     "don't know what kind; it was years ago"  . CHF (congestive heart failure) 04/29/2009    2D Echo - EF 45-50%, normal  . Shortness of breath     "all the time" (07/13/2012)  . Obstructive sleep apnea on CPAP   . Type II diabetes mellitus   . GERD (gastroesophageal reflux disease)   . Stomach ulcer 1980's  . Arthritis     "lower back" (07/13/2012)  . Chronic  lower back pain   . Hematuria ? 2011  . PAD (peripheral artery disease) 05/09/2012    Lower Arterial Exam - Right and left distal external iliac arteries-equal to or less than 50% diameter reductions, Bilateral SFA-suggestive 50-69% diameter reduction,right posterior tibial artery and left peroneal artery both occluded  . Bruit 07/23/2010    Carotid Duplex - Right and left ICAs demonstrate small amount of fibrous plaque not hemodynamically significant  . Complication of anesthesia     pt states was very difficult to wake up  . Pacemaker 2010   Past Surgical History  Procedure Laterality Date  . Coronary stent placement      x 8  . Back surgery      x3  . Coronary angioplasty    . Coronary stent placement  10/2010    Has stents in LAD, RCA,LCX  . Coronary angioplasty with stent placement      "? total of 9 stents over the years; 6 at one time"  . Tonsillectomy      "I was a little boy" (07/13/2012)  . Appendectomy  1954  . Cataract extraction w/ intraocular lens  implant, bilateral  ~ 2011  . Insert / replace / remove pacemaker  07/2008  . Lumbar laminectomy  1990's    "  had an extra lower lumbar; had it taken out" (07/13/2012)  . Lumbar disc surgery  1990's X    "cleaned up mess from 1st then 2nd OR" (07/13/2012)  . Hemorrhoid surgery      "in the dr's office; don't know if they cut them out or banded them"  . Transesophageal echocardiogram  11/13/2004    EF 45-50%, no evidence of intracardiac mass or thrombus, does appear to be a small AST with mild left-to-right and right-to-left shunting  . Cardiac catheterization  06/16/2011    RCA 90% stenosis 3.5x37mm noncompliant Trek balloon was used for high-pressure noncompliant dilation up to approximately 3.51 proximally and 3.4 distally entire segment was reduced to less than 5%  . Cardiac catheterization  10/27/2010    95% stenosis of the distal RCA, 3x47mm bare-metal non-DES Medtronic Integrity stent to overlap stenosis, post-dilated  with a 3.5x21 [place] Sprinter balloon to 3.23mm. As balloon was pulled back, there was progressive increase in dilation to 3.64mm  . Cardiac catheterization  10/26/2010    RCA - discrete 95% proximal in-stent restenosis, mid vessel 80%discrete stenosis;   . Cardiac catheterization  07/30/2008    2x48mm angioscope balloon cuts 3 to 5 atmospheric pressure  . Flexible sigmoidoscopy N/A 04/09/2013    Procedure: FLEXIBLE SIGMOIDOSCOPY;  Surgeon: Petra Kuba, MD;  Location: [facility] END[facility]COPY;  Service: Endoscopy;  Laterality: N/A;  . Esophagogastroduodenoscopy N/A 08/21/2013    Procedure: ESOPHAGOGASTRODUODENOSCOPY (EGD);  Surgeon: Petra Kuba, MD;  Location: [facility];  Service: Endoscopy;  Laterality: N/A;   Family History  Problem Relation Age of Onset  . Heart disease Father   . Heart disease Brother   . Uterine cancer Mother   . Diabetes Sister    History  Substance Use Topics  . Smoking status: Former Smoker -- 2.00 packs/day for 52 years    Types: Cigarettes    Quit date: 07/26/2006  . Smokeless tobacco: Never Used  . Alcohol Use: Yes     Comment: 07/13/2012"did drink at one time for 5 years; stopped ~ 1980"    Review of Systems  Constitutional: Negative for fatigue.  Respiratory: Negative for shortness of breath.   Endocrine: Negative for polyuria.  All other systems reviewed and are negative.      Allergies  Penicillins and Chlorhexidine gluconate  Home Medications   Current Outpatient Rx  Name  Route  Sig  Dispense  Refill  . albuterol (PROAIR HFA) 108 (90 BASE) MCG/ACT inhaler   Inhalation   Inhale 1-2 puffs into the lungs every 6 (six) hours as needed for shortness of breath. For shortness of breath         . albuterol (PROVENTIL) (5 MG/ML) 0.5% nebulizer solution   Nebulization   Take 0.5 mLs (2.5 mg total) by nebulization every 4 (four) hours as needed for wheezing or shortness of breath.         . allopurinol (ZYLOPRIM) 300 MG tablet   Oral   Take 300 mg by  mouth daily.         Marland Kitchen ALPRAZolam (XANAX) 0.5 MG tablet      Take one tablet by mouth twice daily for anxiety   60 tablet   5   . ALPRAZolam (XANAX) 0.5 MG tablet   Oral   Take 0.5 mg by mouth 2 (two) times daily as needed for anxiety.         . budesonide (PULMICORT) 0.5 MG/2ML nebulizer solution   Nebulization   Take 0.5 mg  by nebulization 2 (two) times daily.         . citalopram (CELEXA) 40 MG tablet   Oral   Take 40 mg by mouth daily.         . diphenoxylate-atropine (LOMOTIL) 2.5-0.025 MG per tablet   Oral   Take 1 tablet by mouth 4 (four) times daily as needed for diarrhea or loose stools.         . docusate sodium (COLACE) 100 MG capsule   Oral   Take 100 mg by mouth 2 (two) times daily as needed for mild constipation.         . furosemide (LASIX) 40 MG tablet   Oral   Take 40 mg by mouth.         . gabapentin (NEURONTIN) 300 MG capsule   Oral   Take 300 mg by mouth daily.          . insulin aspart (NOVOLOG) 100 UNIT/ML FlexPen   Subcutaneous   Inject 15 Units into the skin 2 (two) times daily with a meal.         . insulin NPH-regular (RELION 70/30) (70-30) 100 UNIT/ML injection   Subcutaneous   Inject 15 Units into the skin 2 (two) times daily with a meal.         . insulin NPH-regular Human (RELION 70/30) (70-30) 100 UNIT/ML injection   Subcutaneous   Inject into the skin. Sliding scale         . isosorbide mononitrate (IMDUR) 60 MG 24 hr tablet   Oral   Take 60 mg by mouth daily.         . Magnesium 250 MG TABS   Oral   Take 250 mg by mouth daily.          . metFORMIN (GLUCOPHAGE) 500 MG tablet   Oral   Take 500 mg by mouth daily with breakfast.         . metoprolol tartrate (LOPRESSOR) 25 MG tablet   Oral   Take 0.5 tablets (12.5 mg total) by mouth 2 (two) times daily.   90 tablet   0   . oxybutynin (DITROPAN) 5 MG tablet   Oral   Take 5 mg by mouth 3 (three) times daily as needed (for urinary incontinence).           Marland Kitchen. oxymetazoline (AFRIN) 0.05 % nasal spray   Each Nare   Place 1 spray into both nostrils 2 (two) times daily as needed for congestion.          . polyethylene glycol (MIRALAX / GLYCOLAX) packet   Oral   Take 17 g by mouth daily as needed for mild constipation.          . primidone (MYSOLINE) 50 MG tablet   Oral   Take 1-2 tablets (50-100 mg total) by mouth 2 (two) times daily. 1 tab in the morning and 2 tabs at night   90 tablet   2   . simvastatin (ZOCOR) 40 MG tablet   Oral   Take 40 mg by mouth daily.         . simvastatin (ZOCOR) 80 MG tablet   Oral   Take 40 mg by mouth at bedtime.         . sitaGLIPtin (JANUVIA) 50 MG tablet   Oral   Take 50 mg by mouth daily.         Marland Kitchen. spironolactone (ALDACTONE) 25 MG tablet   Oral  Take 12.5 mg by mouth 2 (two) times daily.         Marland Kitchen warfarin (COUMADIN) 5 MG tablet   Oral   Take 5 mg by mouth daily.         . nitroGLYCERIN (NITROSTAT) 0.4 MG SL tablet   Sublingual   Place 0.4 mg under the tongue every 5 (five) minutes as needed for chest pain.           BP 101/65  Pulse 55  Resp 23  SpO2 92% Physical Exam  Nursing note and vitals reviewed. Constitutional: No distress.  Obese  HENT:  Head: Normocephalic and atraumatic.  Right Ear: External ear normal.  Left Ear: External ear normal.  Eyes: Conjunctivae are normal. Right eye exhibits no discharge. Left eye exhibits no discharge. No scleral icterus.  Neck: Neck supple. No tracheal deviation present.  Cardiovascular: Normal rate, regular rhythm and intact distal pulses.   Pulmonary/Chest: Effort normal and breath sounds normal. No stridor. No respiratory distress. He has no wheezes. He has no rales.  Abdominal: Soft. Bowel sounds are normal. He exhibits no distension. There is tenderness in the epigastric area. There is no rigidity, no rebound, no guarding and no CVA tenderness. No hernia.  Musculoskeletal: He exhibits no edema and no  tenderness.  Neurological: He is alert. He has normal strength. No cranial nerve deficit (no facial droop, extraocular movements intact, no slurred speech) or sensory deficit. He exhibits normal muscle tone. He displays no seizure activity. Coordination normal.  Skin: Skin is warm and dry. No rash noted. He is not diaphoretic.  Psychiatric: He has a normal mood and affect.    ED Course  Procedures (including critical care time) Labs Review Labs Reviewed  APTT - Abnormal; Notable for the following:    aPTT 42 (*)    All other components within normal limits  CBC - Abnormal; Notable for the following:    WBC 11.0 (*)    RBC 4.10 (*)    Hemoglobin 12.3 (*)    HCT 37.6 (*)    RDW 16.5 (*)    All other components within normal limits  COMPREHENSIVE METABOLIC PANEL - Abnormal; Notable for the following:    Glucose, Bld 133 (*)    Albumin 3.3 (*)    GFR calc non Af Amer 54 (*)    GFR calc Af Amer 63 (*)    All other components within normal limits  PROTIME-INR - Abnormal; Notable for the following:    Prothrombin Time 18.3 (*)    INR 1.57 (*)    All other components within normal limits  MAGNESIUM  LIPASE, BLOOD  URINALYSIS, ROUTINE W REFLEX MICROSCOPIC  I-STAT TROPOININ, ED  I-STAT ARTERIAL BLOOD GAS, ED   Imaging Review Dg Abd Acute W/chest  09/17/2013   CLINICAL DATA:  Abdominal and chest pain for 2 days.  EXAM: ACUTE ABDOMEN SERIES (ABDOMEN 2 VIEW & CHEST 1 VIEW)  COMPARISON:  08/21/2013  FINDINGS: Patient's left-sided transvenous pacemaker with leads to the right ventricle. The heart is enlarged. There is pulmonary vascular congestion without overt edema. No definite free intraperitoneal air. Bowel gas pattern is nonobstructive. There is mild gaseous distension of the stomach.  IMPRESSION: 1. Cardiomegaly.  Pulmonary vascular congestion. 2. Nonobstructive bowel gas pattern.   Electronically Signed   By: Rosalie Gums M.D.   On: 09/17/2013 21:33    EKG Interpretation     Date/Time:  Monday September 17 2013 19:22:52 EST Ventricular Rate:  59 PR  Interval:    QRS Duration: 157 QT Interval:  510 QTC Calculation: 505 R Axis:   -93 Text Interpretation:  Atrial fibrillation Right bundle branch block No significant change since last tracing Confirmed by Laurice Kimmons  MD-J, Genasis Zingale ([id number]) on 09/17/2013 7:27:42 PM           Old records reviewed.  Pt was seen in the ED last month.  At that time had coffee ground emesis.  Pt had an EGD that showed distal esophagitis but no GI bleeding.  While in the ED patient has had decrease in o2 sat.  Supplemental O2 increased.  RT unable to obtain ABG.  CT scans ordered to evaluate for possible pe, also evaluate abdominal pain.  1210am   Remains stable.  Awaiting CT scan results.   MDM   Pt presents with hypoxia, chest pain and abdominal pain.  Multiple medical problems including COPD.  Initial labs not significantly changed from baseline.  Plan on CT scans to assess for PE, abdominal pathology.  Will turn case over to Dr Hyacinth Meeker.   Celene Kras, MD 09/18/13 [phone]

## 2013-09-17 NOTE — ED Notes (Signed)
Pt sent with mask on 15 L O2, sats 92%

## 2013-09-17 NOTE — Progress Notes (Signed)
Unable to obtain ABG x2 attempts. Pt weaned from NRB to 6L [place]. RT will continue to monitor.

## 2013-09-18 ENCOUNTER — Inpatient Hospital Stay (HOSPITAL_COMMUNITY): Payer: Medicare Other

## 2013-09-18 ENCOUNTER — Encounter (HOSPITAL_COMMUNITY): Payer: Self-pay | Admitting: Radiology

## 2013-09-18 ENCOUNTER — Emergency Department (HOSPITAL_COMMUNITY): Payer: Medicare Other

## 2013-09-18 DIAGNOSIS — I214 Non-ST elevation (NSTEMI) myocardial infarction: Secondary | ICD-10-CM

## 2013-09-18 DIAGNOSIS — I509 Heart failure, unspecified: Secondary | ICD-10-CM

## 2013-09-18 DIAGNOSIS — R0902 Hypoxemia: Secondary | ICD-10-CM | POA: Insufficient documentation

## 2013-09-18 DIAGNOSIS — I5033 Acute on chronic diastolic (congestive) heart failure: Principal | ICD-10-CM

## 2013-09-18 DIAGNOSIS — J438 Other emphysema: Secondary | ICD-10-CM

## 2013-09-18 DIAGNOSIS — I2510 Atherosclerotic heart disease of native coronary artery without angina pectoris: Secondary | ICD-10-CM

## 2013-09-18 DIAGNOSIS — I4891 Unspecified atrial fibrillation: Secondary | ICD-10-CM

## 2013-09-18 DIAGNOSIS — J9600 Acute respiratory failure, unspecified whether with hypoxia or hypercapnia: Secondary | ICD-10-CM | POA: Diagnosis present

## 2013-09-18 DIAGNOSIS — I369 Nonrheumatic tricuspid valve disorder, unspecified: Secondary | ICD-10-CM

## 2013-09-18 DIAGNOSIS — E118 Type 2 diabetes mellitus with unspecified complications: Secondary | ICD-10-CM

## 2013-09-18 DIAGNOSIS — R079 Chest pain, unspecified: Secondary | ICD-10-CM

## 2013-09-18 LAB — COMPREHENSIVE METABOLIC PANEL
ALK PHOS: 93 U/L (ref 39–117)
ALT: 10 U/L (ref 0–53)
AST: 19 U/L (ref 0–37)
Albumin: 3.3 g/dL — ABNORMAL LOW (ref 3.5–5.2)
BUN: 15 mg/dL (ref 6–23)
CALCIUM: 9 mg/dL (ref 8.4–10.5)
CO2: 19 meq/L (ref 19–32)
Chloride: 100 mEq/L (ref 96–112)
Creatinine, Ser: 1.2 mg/dL (ref 0.50–1.35)
GFR calc Af Amer: 67 mL/min — ABNORMAL LOW (ref >90)
GFR, EST NON AFRICAN AMERICAN: 58 mL/min — AB (ref >90)
GLUCOSE: 187 mg/dL — AB (ref 70–99)
Potassium: 4.4 mEq/L (ref 3.7–5.3)
SODIUM: 137 meq/L (ref 137–147)
Total Bilirubin: 0.5 mg/dL (ref 0.3–1.2)
Total Protein: 7.7 g/dL (ref 6.0–8.3)

## 2013-09-18 LAB — URINALYSIS, ROUTINE W REFLEX MICROSCOPIC
Bilirubin Urine: NEGATIVE
Glucose, UA: NEGATIVE mg/dL
Hgb urine dipstick: NEGATIVE
KETONES UR: NEGATIVE mg/dL
LEUKOCYTES UA: NEGATIVE
NITRITE: NEGATIVE
PH: 5 (ref 5.0–8.0)
Protein, ur: NEGATIVE mg/dL
Specific Gravity, Urine: 1.023 (ref 1.005–1.030)
Urobilinogen, UA: 0.2 mg/dL (ref 0.0–1.0)

## 2013-09-18 LAB — CBC WITH DIFFERENTIAL/PLATELET
BASOS ABS: 0 10*3/uL (ref 0.0–0.1)
Basophils Relative: 0 % (ref 0–1)
EOS PCT: 0 % (ref 0–5)
Eosinophils Absolute: 0 10*3/uL (ref 0.0–0.7)
HCT: 38.5 % — ABNORMAL LOW (ref 39.0–52.0)
Hemoglobin: 12.8 g/dL — ABNORMAL LOW (ref 13.0–17.0)
Lymphocytes Relative: 5 % — ABNORMAL LOW (ref 12–46)
Lymphs Abs: 0.4 10*3/uL — ABNORMAL LOW (ref 0.7–4.0)
MCH: 30.5 pg (ref 26.0–34.0)
MCHC: 33.2 g/dL (ref 30.0–36.0)
MCV: 91.9 fL (ref 78.0–100.0)
Monocytes Absolute: 0.1 10*3/uL (ref 0.1–1.0)
Monocytes Relative: 1 % — ABNORMAL LOW (ref 3–12)
NEUTROS PCT: 93 % — AB (ref 43–77)
Neutro Abs: 7.6 10*3/uL (ref 1.7–7.7)
Platelets: 166 10*3/uL (ref 150–400)
RBC: 4.19 MIL/uL — ABNORMAL LOW (ref 4.22–5.81)
RDW: 16.8 % — AB (ref 11.5–15.5)
WBC: 8.1 10*3/uL (ref 4.0–10.5)

## 2013-09-18 LAB — GLUCOSE, CAPILLARY
GLUCOSE-CAPILLARY: 179 mg/dL — AB (ref 70–99)
GLUCOSE-CAPILLARY: 137 mg/dL — AB (ref 70–99)
GLUCOSE-CAPILLARY: 172 mg/dL — AB (ref 70–99)
Glucose-Capillary: 185 mg/dL — ABNORMAL HIGH (ref 70–99)

## 2013-09-18 LAB — TROPONIN I
Troponin I: <0.3 ng/mL (ref <0.30)
Troponin I: <0.3 ng/mL (ref <0.30)
Troponin I: <0.3 ng/mL (ref <0.30)

## 2013-09-18 LAB — PRO B NATRIURETIC PEPTIDE
PRO B NATRI PEPTIDE: 4460 pg/mL — AB (ref 0–125)
Pro B Natriuretic peptide (BNP): 5013 pg/mL — ABNORMAL HIGH (ref 0–125)

## 2013-09-18 LAB — I-STAT TROPONIN, ED: Troponin i, poc: 0.11 ng/mL (ref 0.00–0.08)

## 2013-09-18 LAB — MRSA PCR SCREENING: MRSA BY PCR: NEGATIVE

## 2013-09-18 LAB — HEPARIN LEVEL (UNFRACTIONATED): Heparin Unfractionated: 0.53 IU/mL (ref 0.30–0.70)

## 2013-09-18 LAB — TSH: TSH: 1.359 u[IU]/mL (ref 0.350–4.500)

## 2013-09-18 MED ORDER — ATORVASTATIN CALCIUM 40 MG PO TABS: 40.0000 mg | ORAL_TABLET | Freq: Every day | ORAL | Status: DC

## 2013-09-18 MED ORDER — INSULIN ASPART PROT & ASPART (70-30 MIX) 100 UNIT/ML ~~LOC~~ SUSP
15.0000 [IU] | Freq: Two times a day (BID) | SUBCUTANEOUS | Status: DC
  Administered 2013-09-18 – 2013-09-24 (×13): 15 [IU] via SUBCUTANEOUS
  Filled 2013-09-18 (×4): qty 10

## 2013-09-18 MED ORDER — CITALOPRAM HYDROBROMIDE 40 MG PO TABS
40.0000 mg | ORAL_TABLET | Freq: Every day | ORAL | Status: DC
  Administered 2013-09-18 – 2013-09-20 (×3): 40 mg via ORAL
  Filled 2013-09-18 (×3): qty 1

## 2013-09-18 MED ORDER — INSULIN ASPART 100 UNIT/ML ~~LOC~~ SOLN
0.0000 [IU] | Freq: Three times a day (TID) | SUBCUTANEOUS | Status: DC
  Administered 2013-09-18: 2 [IU] via SUBCUTANEOUS

## 2013-09-18 MED ORDER — PANTOPRAZOLE SODIUM 40 MG PO TBEC
40.0000 mg | DELAYED_RELEASE_TABLET | Freq: Every day | ORAL | Status: DC
  Administered 2013-09-18 – 2013-09-24 (×7): 40 mg via ORAL
  Filled 2013-09-18 (×8): qty 1

## 2013-09-18 MED ORDER — IPRATROPIUM BROMIDE 0.02 % IN SOLN
0.5000 mg | RESPIRATORY_TRACT | Status: AC
  Administered 2013-09-18: 0.5 mg via RESPIRATORY_TRACT
  Filled 2013-09-18: qty 2.5

## 2013-09-18 MED ORDER — METHYLPREDNISOLONE SODIUM SUCC 125 MG IJ SOLR
125.0000 mg | Freq: Once | INTRAMUSCULAR | Status: AC
  Administered 2013-09-18: 125 mg via INTRAVENOUS
  Filled 2013-09-18: qty 2

## 2013-09-18 MED ORDER — ASPIRIN 81 MG PO CHEW
81.0000 mg | CHEWABLE_TABLET | Freq: Every day | ORAL | Status: DC
  Filled 2013-09-18: qty 1

## 2013-09-18 MED ORDER — ALPRAZOLAM 0.5 MG PO TABS: 0.5000 mg | ORAL_TABLET | Freq: Two times a day (BID) | ORAL | Status: DC | PRN

## 2013-09-18 MED ORDER — ATORVASTATIN CALCIUM 80 MG PO TABS
80.0000 mg | ORAL_TABLET | Freq: Every day | ORAL | Status: DC
  Administered 2013-09-18 – 2013-09-24 (×7): 80 mg via ORAL
  Filled 2013-09-18 (×7): qty 1

## 2013-09-18 MED ORDER — MAGNESIUM OXIDE 400 (241.3 MG) MG PO TABS
400.0000 mg | ORAL_TABLET | Freq: Every day | ORAL | Status: DC
  Administered 2013-09-18 – 2013-09-24 (×7): 400 mg via ORAL
  Filled 2013-09-18 (×7): qty 1

## 2013-09-18 MED ORDER — ASPIRIN EC 325 MG PO TBEC
325.0000 mg | DELAYED_RELEASE_TABLET | Freq: Every day | ORAL | Status: DC
  Administered 2013-09-18 – 2013-09-24 (×7): 325 mg via ORAL
  Filled 2013-09-18 (×7): qty 1

## 2013-09-18 MED ORDER — ALBUTEROL SULFATE (2.5 MG/3ML) 0.083% IN NEBU: 2.5000 mg | INHALATION_SOLUTION | RESPIRATORY_TRACT | Status: DC | PRN

## 2013-09-18 MED ORDER — PRIMIDONE 50 MG PO TABS
100.0000 mg | ORAL_TABLET | Freq: Every day | ORAL | Status: DC
  Administered 2013-09-18: 100 mg via ORAL
  Filled 2013-09-18 (×2): qty 2

## 2013-09-18 MED ORDER — PRIMIDONE 50 MG PO TABS: 50.0000 mg | ORAL_TABLET | Freq: Two times a day (BID) | ORAL | Status: DC

## 2013-09-18 MED ORDER — HEPARIN (PORCINE) IN NACL 100-0.45 UNIT/ML-% IJ SOLN
1700.0000 [IU]/h | INTRAMUSCULAR | Status: DC
  Administered 2013-09-18 – 2013-09-20 (×4): 1300 [IU]/h via INTRAVENOUS
  Administered 2013-09-21 – 2013-09-23 (×4): 1400 [IU]/h via INTRAVENOUS
  Administered 2013-09-24: 1700 [IU]/h via INTRAVENOUS
  Filled 2013-09-18 (×12): qty 250

## 2013-09-18 MED ORDER — PRIMIDONE 50 MG PO TABS
50.0000 mg | ORAL_TABLET | Freq: Every day | ORAL | Status: DC
  Administered 2013-09-18: 50 mg via ORAL
  Filled 2013-09-18 (×2): qty 1

## 2013-09-18 MED ORDER — ISOSORBIDE MONONITRATE ER 60 MG PO TB24
60.0000 mg | ORAL_TABLET | Freq: Every day | ORAL | Status: DC
  Administered 2013-09-18 – 2013-09-22 (×5): 60 mg via ORAL
  Filled 2013-09-18 (×6): qty 1

## 2013-09-18 MED ORDER — ALBUTEROL SULFATE (2.5 MG/3ML) 0.083% IN NEBU: 2.5000 mg | INHALATION_SOLUTION | Freq: Four times a day (QID) | RESPIRATORY_TRACT | Status: DC

## 2013-09-18 MED ORDER — ACETAMINOPHEN 650 MG RE SUPP: 650.0000 mg | Freq: Four times a day (QID) | RECTAL | Status: DC | PRN

## 2013-09-18 MED ORDER — ONDANSETRON HCL 4 MG/2ML IJ SOLN
4.0000 mg | Freq: Four times a day (QID) | INTRAMUSCULAR | Status: DC | PRN
  Administered 2013-09-18 – 2013-09-19 (×2): 4 mg via INTRAVENOUS
  Filled 2013-09-18 (×2): qty 2

## 2013-09-18 MED ORDER — IPRATROPIUM BROMIDE 0.02 % IN SOLN: 0.5000 mg | Freq: Four times a day (QID) | RESPIRATORY_TRACT | Status: DC

## 2013-09-18 MED ORDER — IPRATROPIUM-ALBUTEROL 0.5-2.5 (3) MG/3ML IN SOLN
3.0000 mL | Freq: Four times a day (QID) | RESPIRATORY_TRACT | Status: DC
  Administered 2013-09-18 – 2013-09-24 (×22): 3 mL via RESPIRATORY_TRACT
  Filled 2013-09-18 (×23): qty 3

## 2013-09-18 MED ORDER — INSULIN ASPART 100 UNIT/ML ~~LOC~~ SOLN
0.0000 [IU] | SUBCUTANEOUS | Status: DC
Start: 2013-09-18 — End: 2013-09-22
  Administered 2013-09-18 (×4): 2 [IU] via SUBCUTANEOUS
  Administered 2013-09-19: 1 [IU] via SUBCUTANEOUS
  Administered 2013-09-19: 2 [IU] via SUBCUTANEOUS
  Administered 2013-09-19: 1 [IU] via SUBCUTANEOUS
  Administered 2013-09-20: 2 [IU] via SUBCUTANEOUS
  Administered 2013-09-20 – 2013-09-21 (×3): 1 [IU] via SUBCUTANEOUS
  Administered 2013-09-21 – 2013-09-22 (×4): 2 [IU] via SUBCUTANEOUS

## 2013-09-18 MED ORDER — BUDESONIDE 0.5 MG/2ML IN SUSP
0.5000 mg | Freq: Two times a day (BID) | RESPIRATORY_TRACT | Status: DC
  Administered 2013-09-18 – 2013-09-23 (×12): 0.5 mg via RESPIRATORY_TRACT
  Filled 2013-09-18 (×18): qty 2

## 2013-09-18 MED ORDER — SODIUM CHLORIDE 0.9 % IJ SOLN
3.0000 mL | Freq: Two times a day (BID) | INTRAMUSCULAR | Status: DC
  Administered 2013-09-18 – 2013-09-23 (×11): 3 mL via INTRAVENOUS

## 2013-09-18 MED ORDER — ONDANSETRON HCL 4 MG/2ML IJ SOLN: 4.0000 mg | Freq: Three times a day (TID) | INTRAMUSCULAR | Status: DC | PRN

## 2013-09-18 MED ORDER — METOPROLOL TARTRATE 12.5 MG HALF TABLET
12.5000 mg | ORAL_TABLET | Freq: Two times a day (BID) | ORAL | Status: DC
  Administered 2013-09-18 – 2013-09-24 (×9): 12.5 mg via ORAL
  Filled 2013-09-18 (×14): qty 1

## 2013-09-18 MED ORDER — IOHEXOL 350 MG/ML SOLN
100.0000 mL | Freq: Once | INTRAVENOUS | Status: AC | PRN
  Administered 2013-09-18: 100 mL via INTRAVENOUS

## 2013-09-18 MED ORDER — ACETAMINOPHEN 325 MG PO TABS
650.0000 mg | ORAL_TABLET | Freq: Four times a day (QID) | ORAL | Status: DC | PRN
  Administered 2013-09-19 – 2013-09-23 (×4): 650 mg via ORAL
  Filled 2013-09-18 (×4): qty 2

## 2013-09-18 MED ORDER — ALLOPURINOL 300 MG PO TABS
300.0000 mg | ORAL_TABLET | Freq: Every day | ORAL | Status: DC
  Administered 2013-09-18 – 2013-09-24 (×7): 300 mg via ORAL
  Filled 2013-09-18 (×7): qty 1

## 2013-09-18 MED ORDER — ALBUTEROL SULFATE (2.5 MG/3ML) 0.083% IN NEBU
5.0000 mg | INHALATION_SOLUTION | Freq: Once | RESPIRATORY_TRACT | Status: AC
  Administered 2013-09-18: 5 mg via RESPIRATORY_TRACT
  Filled 2013-09-18: qty 6

## 2013-09-18 MED ORDER — OXYBUTYNIN CHLORIDE 5 MG PO TABS
5.0000 mg | ORAL_TABLET | Freq: Three times a day (TID) | ORAL | Status: DC | PRN
  Filled 2013-09-18: qty 1

## 2013-09-18 MED ORDER — FUROSEMIDE 10 MG/ML IJ SOLN
40.0000 mg | Freq: Two times a day (BID) | INTRAMUSCULAR | Status: DC
  Administered 2013-09-18: 40 mg via INTRAVENOUS

## 2013-09-18 MED ORDER — ONDANSETRON HCL 4 MG PO TABS: 4.0000 mg | ORAL_TABLET | Freq: Four times a day (QID) | ORAL | Status: DC | PRN

## 2013-09-18 MED ORDER — SPIRONOLACTONE 12.5 MG HALF TABLET
12.5000 mg | ORAL_TABLET | Freq: Two times a day (BID) | ORAL | Status: DC
  Administered 2013-09-18 – 2013-09-22 (×10): 12.5 mg via ORAL
  Filled 2013-09-18 (×14): qty 1

## 2013-09-18 MED ORDER — PROMETHAZINE HCL 25 MG/ML IJ SOLN
12.5000 mg | Freq: Once | INTRAMUSCULAR | Status: AC
  Administered 2013-09-18: 12.5 mg via INTRAVENOUS
  Filled 2013-09-18: qty 1

## 2013-09-18 MED ORDER — NITROGLYCERIN 0.4 MG SL SUBL: 0.4000 mg | SUBLINGUAL_TABLET | SUBLINGUAL | Status: DC | PRN

## 2013-09-18 MED ORDER — MAGNESIUM 250 MG PO TABS: 250.0000 mg | ORAL_TABLET | Freq: Every day | ORAL | Status: DC

## 2013-09-18 MED ORDER — FUROSEMIDE 10 MG/ML IJ SOLN
40.0000 mg | Freq: Two times a day (BID) | INTRAMUSCULAR | Status: DC
  Filled 2013-09-18: qty 4

## 2013-09-18 MED ORDER — SIMVASTATIN 40 MG PO TABS: 40.0000 mg | ORAL_TABLET | Freq: Every day | ORAL | Status: DC

## 2013-09-18 MED ORDER — LINAGLIPTIN 5 MG PO TABS
5.0000 mg | ORAL_TABLET | Freq: Every day | ORAL | Status: DC
Start: 2013-09-18 — End: 2013-09-24
  Administered 2013-09-18 – 2013-09-24 (×7): 5 mg via ORAL
  Filled 2013-09-18 (×7): qty 1

## 2013-09-18 MED ORDER — GABAPENTIN 300 MG PO CAPS
300.0000 mg | ORAL_CAPSULE | Freq: Every day | ORAL | Status: DC
  Administered 2013-09-18 – 2013-09-24 (×7): 300 mg via ORAL
  Filled 2013-09-18 (×7): qty 1

## 2013-09-18 MED ORDER — FUROSEMIDE 10 MG/ML IJ SOLN
40.0000 mg | Freq: Two times a day (BID) | INTRAMUSCULAR | Status: DC
  Administered 2013-09-18 – 2013-09-21 (×7): 40 mg via INTRAVENOUS
  Filled 2013-09-18 (×9): qty 4

## 2013-09-18 NOTE — Progress Notes (Signed)
  Echocardiogram 2D Echocardiogram has been performed.  Arvil ChacoFoster, Shaletha Humble 09/18/2013, 12:56 PM

## 2013-09-18 NOTE — ED Notes (Signed)
Report given to floor RN Shanda BumpsJessica, RN states she is asking charge RN of floor if patient is suitable for the unit.

## 2013-09-18 NOTE — Progress Notes (Signed)
Utilization review completed.  

## 2013-09-18 NOTE — ED Notes (Signed)
House coverage states patient will not receive a bed until 0600.

## 2013-09-18 NOTE — Progress Notes (Addendum)
Followup note:  Patient admitted earlier this morning. Seen in followup after transferred to step down unit .  No chest pain, breathing somewhat easier.  Acute respiratory failure multifactorial: Likely from CHF. Cardiology consulted. 2-D echo ordered. On IV Lasix. Repeating chest x-ray.  Non-ST elevated MI versus unstable angina: On IV heparin plus aspirin plus nitroglycerin. Following cardiac markers. Keep n.p.o. for now for possible procedure.  Chronic A. fib: Normally on Coumadin. Changed over to IV heparin for possible procedure.  Obstructive sleep apnea, adjust orders so CPAP only at night.    COPD on chronic home oxygen: Breathing easier. We'll try to wean down the BiPAP. Followup blood gas tomorrow to make sure no CO2 retention.  Diabetes mellitus: On sliding scale every 4 hours  Called an updated son.

## 2013-09-18 NOTE — ED Notes (Signed)
Handoff report given

## 2013-09-18 NOTE — ED Provider Notes (Signed)
Patient has been persistently hypoxic requiring increased oxygen, CT scan reveals no signs of pulmonary embolism or acute abdominal abnormality, troponin repeated and was slightly elevated, Dr. Shirlee LatchMcLean has been consulted from cardiology and recommends internal medicine admission. Discussed with hospitalist who will admit;  Dr. Lorina RabonKakrakandy  Exavior Kimmons D Tishia Maestre, MD 09/18/13 [phone]

## 2013-09-18 NOTE — Progress Notes (Signed)
Pt is feeling nauseated and is on 40% venturi mask.  Pt states that he wears 3 L of O2 at home.   Pt is going to wait on wearing the CPAP  due to the nausea. Advised that the RT will continue to monitor.

## 2013-09-18 NOTE — ED Notes (Signed)
Pt venturi mask at 55% 15L O2

## 2013-09-18 NOTE — ED Notes (Signed)
Pt has not voided for U/A yet

## 2013-09-18 NOTE — ED Notes (Signed)
Heparin verified with RN, SMW

## 2013-09-18 NOTE — H&P (Signed)
[facility] History and Physical  Bruce Taylor JWJ:[id number] DOB: Sep 24, 1940 DOA: 09/17/2013  Referring physician: ER physician. PCP:  Bruce Lope, MD   Chief Complaint: Chest pain and shortness of breath.  HPI: Bruce Taylor is a 73 y.o. 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 Patient also had mild shortness of breath with nausea and vomiting. Patient's chest pain is retrosternal and left-sided. Patient says the last few days has been having diarrhea at least one or 2 episodes. Denies any abdominal pain. In the ER patient had CT angiogram of the chest which showed features concerning for CHF. Patient's troponin was elevated and patient was placed on 50% Ventimask. Patient otherwise is able to talk complete sentences and presently chest pain-free. Patient denies any productive cough fever chills any focal deficits. On-call cardiologist Dr. Shirlee Taylor has seen the patient and at this time has recommended patient be on IV Lasix and heparin. Patient has been admitted for further management.   Review of Systems: As presented in the history of presenting illness, rest negative.  Past Medical History  Diagnosis Date  . Hyperlipidemia   . Hypertension   . 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 Chronic atrial fibrillation   . Ischemic cardiomyopathy     EF 45% by echo 2010  . COPD (chronic obstructive pulmonary disease) 02/04/2010  . On home O2     "24/7"  . Renal insufficiency   . Tremor   . Angina pectoris   . Obesity, morbid 06/15/2011  . Coronary artery disease     Multiple PCIs  . Myocardial infarct ? 1995  . Blood transfusion ?2011    "hemorrhaging out my penis" (07/13/2012)  . Anemia   . Ulcer     "don't know what kind; it was years ago"  . CHF (congestive heart failure) 04/29/2009    2D  Echo - EF 45-50%, normal  . Shortness of breath     "all the time" (07/13/2012)  . Obstructive sleep apnea on CPAP   . Type II diabetes mellitus   . GERD (gastroesophageal reflux disease)   . Stomach ulcer 1980's  . Arthritis     "lower back" (07/13/2012)  . Chronic lower back pain   . Hematuria ? 2011  . PAD (peripheral artery disease) 05/09/2012    Lower Arterial Exam - Right and left distal external iliac arteries-equal to or less than 50% diameter reductions, Bilateral SFA-suggestive 50-69% diameter reduction,right posterior tibial artery and left peroneal artery both occluded  . Bruit 07/23/2010    Carotid Duplex - Right and left ICAs demonstrate small amount of fibrous plaque not hemodynamically significant  . Complication of anesthesia     pt states was very difficult to wake up  . Pacemaker 2010   Past Surgical History  Procedure Laterality Date  . Coronary stent placement      x 8  . Back surgery      x3  . Coronary angioplasty    . Coronary stent placement  10/2010    Has stents in LAD, RCA,LCX  . Coronary angioplasty with stent placement      "? total of 9 stents over the years; 6 at one time"  . Tonsillectomy      "I was a little boy" (07/13/2012)  . Appendectomy  1954  .  Cataract extraction w/ intraocular lens  implant, bilateral  ~ 2011  . Insert / replace / remove pacemaker  07/2008  . Lumbar laminectomy  1990's    "had an extra lower lumbar; had it taken out" (07/13/2012)  . Lumbar disc surgery  1990's X    "cleaned up mess from 1st then 2nd OR" (07/13/2012)  . Hemorrhoid surgery      "in the dr's office; don't know if they cut them out or banded them"  . Transesophageal echocardiogram  11/13/2004    EF 45-50%, no evidence of intracardiac mass or thrombus, does appear to be a small AST with mild left-to-right and right-to-left shunting  . Cardiac catheterization  06/16/2011    RCA 90% stenosis 3.5x78mm noncompliant Trek balloon was used for high-pressure  noncompliant dilation up to approximately 3.51 proximally and 3.4 distally entire segment was reduced to less than 5%  . Cardiac catheterization  10/27/2010    95% stenosis of the distal RCA, 3x25mm bare-metal non-DES Medtronic Integrity stent to overlap stenosis, post-dilated with a 3.5x21 Gregg Sprinter balloon to 3.31mm. As balloon was pulled back, there was progressive increase in dilation to 3.49mm  . Cardiac catheterization  10/26/2010    RCA - discrete 95% proximal in-stent restenosis, mid vessel 80%discrete stenosis;   . Cardiac catheterization  07/30/2008    2x64mm angioscope balloon cuts 3 to 5 atmospheric pressure  . Flexible sigmoidoscopy N/A 04/09/2013    Procedure: FLEXIBLE SIGMOIDOSCOPY;  Surgeon: Bruce Kuba, MD;  Location: [facility] ENDOSCOPY;  Service: Endoscopy;  Laterality: N/A;  . Esophagogastroduodenoscopy N/A 08/21/2013    Procedure: ESOPHAGOGASTRODUODENOSCOPY (EGD);  Surgeon: Bruce Kuba, MD;  Location: [facility] ENDOSCOPY;  Service: Endoscopy;  Laterality: N/A;   Social History:  reports that he quit smoking about 7 years ago. His smoking use included Cigarettes. He has a 104 pack-year smoking history. He has never used smokeless tobacco. He reports that he drinks alcohol. He reports that he does not use illicit drugs. Where does patient live home. Can patient participate in ADLs? Yes.  Allergies  Allergen Reactions  . Penicillins Anaphylaxis and Swelling    "swell up all over my body; throat mainly, I think" (07/13/2012)  . Chlorhexidine Gluconate Itching    Chg cloth "don't remember how bad it was" (07/13/2012)    Family History:  Family History  Problem Relation Age of Onset  . Heart disease Father   . Heart disease Brother   . Uterine cancer Mother   . Diabetes Sister       Prior to Admission medications   Medication Sig Start Date End Date Taking? Authorizing Provider  albuterol (PROAIR HFA) 108 (90 BASE) MCG/ACT inhaler Inhale 1-2 puffs into the lungs every 6 (six) hours as  needed for shortness of breath. For shortness of breath   Yes Historical Provider, MD  albuterol (PROVENTIL) (5 MG/ML) 0.5% nebulizer solution Take 0.5 mLs (2.5 mg total) by nebulization every 4 (four) hours as needed for wheezing or shortness of breath. 04/12/13  Yes Elease Etienne, MD  allopurinol (ZYLOPRIM) 300 MG tablet Take 300 mg by mouth daily.   Yes Historical Provider, MD  ALPRAZolam Prudy Feeler) 0.5 MG tablet Take one tablet by mouth twice daily for anxiety 06/11/13  Yes Sharee Holster, NP  ALPRAZolam Prudy Feeler) 0.5 MG tablet Take 0.5 mg by mouth 2 (two) times daily as needed for anxiety.   Yes Historical Provider, MD  budesonide (PULMICORT) 0.5 MG/2ML nebulizer solution Take 0.5 mg by nebulization 2 (two)  times daily. 04/12/13  Yes Elease Etienne, MD  citalopram (CELEXA) 40 MG tablet Take 40 mg by mouth daily.   Yes Historical Provider, MD  diphenoxylate-atropine (LOMOTIL) 2.5-0.025 MG per tablet Take 1 tablet by mouth 4 (four) times daily as needed for diarrhea or loose stools.   Yes Historical Provider, MD  docusate sodium (COLACE) 100 MG capsule Take 100 mg by mouth 2 (two) times daily as needed for mild constipation.   Yes Historical Provider, MD  furosemide (LASIX) 40 MG tablet Take 40 mg by mouth.   Yes Historical Provider, MD  gabapentin (NEURONTIN) 300 MG capsule Take 300 mg by mouth daily.    Yes Historical Provider, MD  insulin aspart (NOVOLOG) 100 UNIT/ML FlexPen Inject 15 Units into the skin 2 (two) times daily with a meal.   Yes Historical Provider, MD  insulin NPH-regular (RELION 70/30) (70-30) 100 UNIT/ML injection Inject 15 Units into the skin 2 (two) times daily with a meal. 04/12/13  Yes Elease Etienne, MD  insulin NPH-regular Human (RELION 70/30) (70-30) 100 UNIT/ML injection Inject into the skin. Sliding scale   Yes Historical Provider, MD  isosorbide mononitrate (IMDUR) 60 MG 24 hr tablet Take 60 mg by mouth daily.   Yes Historical Provider, MD  Magnesium 250 MG TABS Take  250 mg by mouth daily.    Yes Historical Provider, MD  metFORMIN (GLUCOPHAGE) 500 MG tablet Take 500 mg by mouth daily with breakfast.   Yes Historical Provider, MD  metoprolol tartrate (LOPRESSOR) 25 MG tablet Take 0.5 tablets (12.5 mg total) by mouth 2 (two) times daily. 08/03/13  Yes Lennette Bihari, MD  oxybutynin (DITROPAN) 5 MG tablet Take 5 mg by mouth 3 (three) times daily as needed (for urinary incontinence).    Yes Historical Provider, MD  oxymetazoline (AFRIN) 0.05 % nasal spray Place 1 spray into both nostrils 2 (two) times daily as needed for congestion.    Yes Historical Provider, MD  polyethylene glycol (MIRALAX / GLYCOLAX) packet Take 17 g by mouth daily as needed for mild constipation.  04/12/13  Yes Elease Etienne, MD  primidone (MYSOLINE) 50 MG tablet Take 1-2 tablets (50-100 mg total) by mouth 2 (two) times daily. 1 tab in the morning and 2 tabs at night 02/05/13  Yes Mihai Croitoru, MD  simvastatin (ZOCOR) 40 MG tablet Take 40 mg by mouth daily.   Yes Historical Provider, MD  simvastatin (ZOCOR) 80 MG tablet Take 40 mg by mouth at bedtime.   Yes Historical Provider, MD  sitaGLIPtin (JANUVIA) 50 MG tablet Take 50 mg by mouth daily.   Yes Historical Provider, MD  spironolactone (ALDACTONE) 25 MG tablet Take 12.5 mg by mouth 2 (two) times daily.   Yes Historical Provider, MD  warfarin (COUMADIN) 5 MG tablet Take 5 mg by mouth daily.   Yes Historical Provider, MD  nitroGLYCERIN (NITROSTAT) 0.4 MG SL tablet Place 0.4 mg under the tongue every 5 (five) minutes as needed for chest pain.     Historical Provider, MD    Physical Exam: Filed Vitals:   09/18/13 0130 09/18/13 0200 09/18/13 0222 09/18/13 0230  BP: 100/55 99/60 99/60  104/53  Pulse: 55 56 62 55  Temp:   98.6 F (37 C)   TempSrc:   Oral   Resp: 17 21 18 17   SpO2: 93% 96% 95% 93%     General:  Well-developed well-nourished.  Eyes: Anicteric no pallor.  ENT: No discharge from the ears eyes nose mouth.  Neck: No mass  felt.  Cardiovascular: S1-S2 heard.  Respiratory: No rhonchi or crepitations.  Abdomen: Soft nontender bowel sounds present. No guarding or rigidity.  Skin: No rash.  Musculoskeletal: No edema.  Psychiatric: Appears normal.  Neurologic: Alert. Oriented to time place and person. Moves all extremities.  Labs on Admission:  Basic Metabolic Panel:  Recent Labs Lab 09/17/13 2017  NA 140  K 4.3  CL 103  CO2 21  GLUCOSE 133*  BUN 14  CREATININE 1.27  CALCIUM 9.1  MG 1.7   Liver Function Tests:  Recent Labs Lab 09/17/13 2017  AST 20  ALT 10  ALKPHOS 95  BILITOT 0.5  PROT 7.8  ALBUMIN 3.3*    Recent Labs Lab 09/17/13 2017  LIPASE 14   No results found for this basename: AMMONIA,  in the last 168 hours CBC:  Recent Labs Lab 09/17/13 2017  WBC 11.0*  HGB 12.3*  HCT 37.6*  MCV 91.7  PLT 159   Cardiac Enzymes:  Recent Labs Lab 09/18/13 0217  TROPONINI <0.30    BNP (last 3 results)  Recent Labs  04/03/13 1204 09/18/13 0217  PROBNP 2587.0* 4460.0*   CBG: No results found for this basename: GLUCAP,  in the last 168 hours  Radiological Exams on Admission: Ct Angio Chest W/cm &/or Wo Cm  09/18/2013   CLINICAL DATA:  73 year old male with chest pain, nausea, vomiting and abdominal/pelvic pain.  EXAM: CT ANGIOGRAPHY CHEST  CT ABDOMEN AND PELVIS WITH CONTRAST  TECHNIQUE: Multidetector CT imaging of the chest was performed using the standard protocol during bolus administration of intravenous contrast. Multiplanar CT image reconstructions and MIPs were obtained to evaluate the vascular anatomy. Multidetector CT imaging of the abdomen and pelvis was performed using the standard protocol during bolus administration of intravenous contrast.  CONTRAST:  OMNIPAQUE IOHEXOL 350 MG/ML SOLN  COMPARISON:  08/09/2013 and prior abdominal pelvic CTs.  FINDINGS: CTA CHEST FINDINGS  This is a technically adequate study although respiratory motion artifact decreases  sensitivity in several areas.  There is no evidence of pulmonary emboli. The central pulmonary arteries are enlarged compatible with pulmonary arterial hypertension.  Cardiomegaly and heavy coronary artery calcifications are present.  There is no evidence of thoracic aortic aneurysm.  A small left pleural effusion and miniscule right pleural effusion noted.  Mild to moderate bibasilar atelectasis is present.  Mild central ground-glass opacities may represent pulmonary edema.  There is no evidence of consolidation or mass.  No acute or suspicious bony abnormalities are present.  Review of the MIP images confirms the above findings.  CT ABDOMEN and PELVIS FINDINGS  Cirrhosis identified without focal hepatic mass.  Cholelithiasis identified without CT evidence of cholecystitis.  Spleen, pancreas, adrenal glands are unremarkable.  Bilateral renal cortical thinning and renal cysts again noted.  There is no evidence of free fluid, enlarged lymph nodes, biliary dilation or abdominal aortic aneurysm.  The bowel and bladder are unremarkable.  No acute or suspicious bony abnormalities are identified. Degenerative changes with throughout the lumbar spine again noted.  IMPRESSION: No evidence of pulmonary emboli or aortic aneurysm.  Cardiomegaly with small bilateral pleural effusions, bibasilar atelectasis and question interstitial pulmonary edema.  Cirrhosis.  Cholelithiasis.  Coronary artery disease.   Electronically Signed   By: Laveda Abbe M.D.   On: 09/18/2013 00:51   Ct Abdomen Pelvis W Contrast  09/18/2013   CLINICAL DATA:  73 year old male with chest pain, nausea, vomiting and abdominal/pelvic pain.  EXAM: CT ANGIOGRAPHY  CHEST  CT ABDOMEN AND PELVIS WITH CONTRAST  TECHNIQUE: Multidetector CT imaging of the chest was performed using the standard protocol during bolus administration of intravenous contrast. Multiplanar CT image reconstructions and MIPs were obtained to evaluate the vascular anatomy. Multidetector CT  imaging of the abdomen and pelvis was performed using the standard protocol during bolus administration of intravenous contrast.  CONTRAST:  OMNIPAQUE IOHEXOL 350 MG/ML SOLN  COMPARISON:  08/09/2013 and prior abdominal pelvic CTs.  FINDINGS: CTA CHEST FINDINGS  This is a technically adequate study although respiratory motion artifact decreases sensitivity in several areas.  There is no evidence of pulmonary emboli. The central pulmonary arteries are enlarged compatible with pulmonary arterial hypertension.  Cardiomegaly and heavy coronary artery calcifications are present.  There is no evidence of thoracic aortic aneurysm.  A small left pleural effusion and miniscule right pleural effusion noted.  Mild to moderate bibasilar atelectasis is present.  Mild central ground-glass opacities may represent pulmonary edema.  There is no evidence of consolidation or mass.  No acute or suspicious bony abnormalities are present.  Review of the MIP images confirms the above findings.  CT ABDOMEN and PELVIS FINDINGS  Cirrhosis identified without focal hepatic mass.  Cholelithiasis identified without CT evidence of cholecystitis.  Spleen, pancreas, adrenal glands are unremarkable.  Bilateral renal cortical thinning and renal cysts again noted.  There is no evidence of free fluid, enlarged lymph nodes, biliary dilation or abdominal aortic aneurysm.  The bowel and bladder are unremarkable.  No acute or suspicious bony abnormalities are identified. Degenerative changes with throughout the lumbar spine again noted.  IMPRESSION: No evidence of pulmonary emboli or aortic aneurysm.  Cardiomegaly with small bilateral pleural effusions, bibasilar atelectasis and question interstitial pulmonary edema.  Cirrhosis.  Cholelithiasis.  Coronary artery disease.   Electronically Signed   By: Laveda Abbe M.D.   On: 09/18/2013 00:51   Dg Abd Acute W/chest  09/17/2013   CLINICAL DATA:  Abdominal and chest pain for 2 days.  EXAM: ACUTE ABDOMEN  SERIES (ABDOMEN 2 VIEW & CHEST 1 VIEW)  COMPARISON:  08/21/2013  FINDINGS: Patient's left-sided transvenous pacemaker with leads to the right ventricle. The heart is enlarged. There is pulmonary vascular congestion without overt edema. No definite free intraperitoneal air. Bowel gas pattern is nonobstructive. There is mild gaseous distension of the stomach.  IMPRESSION: 1. Cardiomegaly.  Pulmonary vascular congestion. 2. Nonobstructive bowel gas pattern.   Electronically Signed   By: Rosalie Gums M.D.   On: 09/17/2013 21:33    EKG: Independently reviewed. Atrial fibrillation with RBBB. Rate control.  Assessment/Plan Principal Problem:   Acute respiratory failure Active Problems:   HYPERTENSION   COPD (chronic obstructive pulmonary disease) with emphysema   Unstable angina   DM (diabetes mellitus), type 2 with complications   Chronic a-fib   1. Acute respiratory failure most likely from CHF - patient has been placed on IV Lasix 40 mg every 12 hourly. Closely follow intake output and metabolic panel. Check 2-D echo. Last EF was more than 50%. Repeat chest x-ray. 2. Non-ST elevation MI versus unstable angina - patient has been placed on IV heparin infusion. Aspirin. Cycle cardiac markers. Nitroglycerin paste. Check 2-D echo. Patient kept n.p.o. in anticipation of possible procedure. 3. Chronic atrial fibrillation presently rate controlled - patient presently on IV heparin as patient's Coumadin is on hold for possible procedure. 4. COPD on home oxygen - presently not wheezing. Continue inhalers. 5. Diabetes mellitus type 2 - closely follow CBGs  with insulin sliding-scale coverage. 6. Hypertension - continue home medications. 7. OSA - CPAP. 8. Hyperlipidemia - continue home medications. 9. Diarrhea - probably viral. Check stool studies. Patient denies having taken any recent antibiotics.  Have reviewed patient's old charts and labs.  Code Status:  Full code.  Family Communication:  None.   Disposition Plan:  Admit to inpatient.    Jasminemarie Sherrard N. [facility] Pager [phone].  If 7PM-7AM, please contact night-coverage [email or web address] Password [email or web address] 09/18/2013, 4:16 AM

## 2013-09-18 NOTE — Progress Notes (Signed)
SUBJECTIVE:  He continues to have chest pain.  It is constant and atypica.  EKG is paced but enzymes negative.     PHYSICAL EXAM Filed Vitals:   09/18/13 0929 09/18/13 1200 09/18/13 1239 09/18/13 1400  BP:   101/67   Pulse:   55 55  Temp:  97.8 F (36.6 C)    TempSrc:  Oral    Resp:   18 19  Height:    5\' 10"  (1.778 m)  Weight:    246 lb 14.6 oz (112 kg)  SpO2: 99%  92% 84%   General:  No distress Lungs:  Clear Heart:  RRR Abdomen:  Positive bowel sounds, no rebound no guarding Extremities:  No edema   LABS: Lab Results  Component Value Date   TROPONINI <0.30 09/18/2013   Results for orders placed during the hospital encounter of 09/17/13 (from the past 24 hour(s))  APTT     Status: Abnormal   Collection Time    09/17/13  8:17 PM      Result Value Ref Range   aPTT 42 (*) 24 - 37 seconds  CBC     Status: Abnormal   Collection Time    09/17/13  8:17 PM      Result Value Ref Range   WBC 11.0 (*) 4.0 - 10.5 K/uL   RBC 4.10 (*) 4.22 - 5.81 MIL/uL   Hemoglobin 12.3 (*) 13.0 - 17.0 g/dL   HCT 16.1 (*) 09.6 - 04.5 %   MCV 91.7  78.0 - 100.0 fL   MCH 30.0  26.0 - 34.0 pg   MCHC 32.7  30.0 - 36.0 g/dL   RDW 40.9 (*) 81.1 - 91.4 %   Platelets 159  150 - 400 K/uL  COMPREHENSIVE METABOLIC PANEL     Status: Abnormal   Collection Time    09/17/13  8:17 PM      Result Value Ref Range   Sodium 140  137 - 147 mEq/L   Potassium 4.3  3.7 - 5.3 mEq/L   Chloride 103  96 - 112 mEq/L   CO2 21  19 - 32 mEq/L   Glucose, Bld 133 (*) 70 - 99 mg/dL   BUN 14  6 - 23 mg/dL   Creatinine, Ser 7.82  0.50 - 1.35 mg/dL   Calcium 9.1  8.4 - 95.6 mg/dL   Total Protein 7.8  6.0 - 8.3 g/dL   Albumin 3.3 (*) 3.5 - 5.2 g/dL   AST 20  0 - 37 U/L   ALT 10  0 - 53 U/L   Alkaline Phosphatase 95  39 - 117 U/L   Total Bilirubin 0.5  0.3 - 1.2 mg/dL   GFR calc non Af Amer 54 (*) >90 mL/min   GFR calc Af Amer 63 (*) >90 mL/min  PROTIME-INR     Status: Abnormal   Collection Time    09/17/13  8:17  PM      Result Value Ref Range   Prothrombin Time 18.3 (*) 11.6 - 15.2 seconds   INR 1.57 (*) 0.00 - 1.49  MAGNESIUM     Status: None   Collection Time    09/17/13  8:17 PM      Result Value Ref Range   Magnesium 1.7  1.5 - 2.5 mg/dL  LIPASE, BLOOD     Status: None   Collection Time    09/17/13  8:17 PM      Result Value Ref Range   Lipase 14  11 - 59 U/L  Rosezena Sensor, ED     Status: None   Collection Time    09/17/13  8:27 PM      Result Value Ref Range   Troponin i, poc 0.03  0.00 - 0.08 ng/mL   Comment 3           I-STAT TROPOININ, ED     Status: Abnormal   Collection Time    09/18/13 12:43 AM      Result Value Ref Range   Troponin i, poc 0.11 (*) 0.00 - 0.08 ng/mL   Comment NOTIFIED PHYSICIAN     Comment 3           TROPONIN I     Status: None   Collection Time    09/18/13  2:17 AM      Result Value Ref Range   Troponin I <0.30  <0.30 ng/mL  PRO B NATRIURETIC PEPTIDE     Status: Abnormal   Collection Time    09/18/13  2:17 AM      Result Value Ref Range   Pro B Natriuretic peptide (BNP) 4460.0 (*) 0 - 125 pg/mL  URINALYSIS, ROUTINE W REFLEX MICROSCOPIC     Status: None   Collection Time    09/18/13  3:35 AM      Result Value Ref Range   Color, Urine YELLOW  YELLOW   APPearance CLEAR  CLEAR   Specific Gravity, Urine 1.023  1.005 - 1.030   pH 5.0  5.0 - 8.0   Glucose, UA NEGATIVE  NEGATIVE mg/dL   Hgb urine dipstick NEGATIVE  NEGATIVE   Bilirubin Urine NEGATIVE  NEGATIVE   Ketones, ur NEGATIVE  NEGATIVE mg/dL   Protein, ur NEGATIVE  NEGATIVE mg/dL   Urobilinogen, UA 0.2  0.0 - 1.0 mg/dL   Nitrite NEGATIVE  NEGATIVE   Leukocytes, UA NEGATIVE  NEGATIVE  MRSA PCR SCREENING     Status: None   Collection Time    09/18/13  6:31 AM      Result Value Ref Range   MRSA by PCR NEGATIVE  NEGATIVE  TROPONIN I     Status: None   Collection Time    09/18/13  7:10 AM      Result Value Ref Range   Troponin I <0.30  <0.30 ng/mL  COMPREHENSIVE METABOLIC PANEL      Status: Abnormal   Collection Time    09/18/13  7:10 AM      Result Value Ref Range   Sodium 137  137 - 147 mEq/L   Potassium 4.4  3.7 - 5.3 mEq/L   Chloride 100  96 - 112 mEq/L   CO2 19  19 - 32 mEq/L   Glucose, Bld 187 (*) 70 - 99 mg/dL   BUN 15  6 - 23 mg/dL   Creatinine, Ser 1.61  0.50 - 1.35 mg/dL   Calcium 9.0  8.4 - 09.6 mg/dL   Total Protein 7.7  6.0 - 8.3 g/dL   Albumin 3.3 (*) 3.5 - 5.2 g/dL   AST 19  0 - 37 U/L   ALT 10  0 - 53 U/L   Alkaline Phosphatase 93  39 - 117 U/L   Total Bilirubin 0.5  0.3 - 1.2 mg/dL   GFR calc non Af Amer 58 (*) >90 mL/min   GFR calc Af Amer 67 (*) >90 mL/min  PRO B NATRIURETIC PEPTIDE     Status: Abnormal   Collection Time  09/18/13  7:10 AM      Result Value Ref Range   Pro B Natriuretic peptide (BNP) 5013.0 (*) 0 - 125 pg/mL  TSH     Status: None   Collection Time    09/18/13  7:10 AM      Result Value Ref Range   TSH 1.359  0.350 - 4.500 uIU/mL  CBC WITH DIFFERENTIAL     Status: Abnormal   Collection Time    09/18/13  7:10 AM      Result Value Ref Range   WBC 8.1  4.0 - 10.5 K/uL   RBC 4.19 (*) 4.22 - 5.81 MIL/uL   Hemoglobin 12.8 (*) 13.0 - 17.0 g/dL   HCT 01.038.5 (*) 27.239.0 - 53.652.0 %   MCV 91.9  78.0 - 100.0 fL   MCH 30.5  26.0 - 34.0 pg   MCHC 33.2  30.0 - 36.0 g/dL   RDW 64.416.8 (*) 03.411.5 - 74.215.5 %   Platelets 166  150 - 400 K/uL   Neutrophils Relative % 93 (*) 43 - 77 %   Neutro Abs 7.6  1.7 - 7.7 K/uL   Lymphocytes Relative 5 (*) 12 - 46 %   Lymphs Abs 0.4 (*) 0.7 - 4.0 K/uL   Monocytes Relative 1 (*) 3 - 12 %   Monocytes Absolute 0.1  0.1 - 1.0 K/uL   Eosinophils Relative 0  0 - 5 %   Eosinophils Absolute 0.0  0.0 - 0.7 K/uL   Basophils Relative 0  0 - 1 %   Basophils Absolute 0.0  0.0 - 0.1 K/uL  GLUCOSE, CAPILLARY     Status: Abnormal   Collection Time    09/18/13  7:37 AM      Result Value Ref Range   Glucose-Capillary 185 (*) 70 - 99 mg/dL   Comment 1 Documented in Chart     Comment 2 Notify RN    HEPARIN LEVEL  (UNFRACTIONATED)     Status: None   Collection Time    09/18/13 11:32 AM      Result Value Ref Range   Heparin Unfractionated 0.53  0.30 - 0.70 IU/mL  GLUCOSE, CAPILLARY     Status: Abnormal   Collection Time    09/18/13 12:11 PM      Result Value Ref Range   Glucose-Capillary 179 (*) 70 - 99 mg/dL   Comment 1 Documented in Chart     Comment 2 Notify RN    TROPONIN I     Status: None   Collection Time    09/18/13 12:47 PM      Result Value Ref Range   Troponin I <0.30  <0.30 ng/mL    Intake/Output Summary (Last 24 hours) at 09/18/13 1556 Last data filed at 09/18/13 1200  Gross per 24 hour  Intake 712.58 ml  Output    700 ml  Net  12.58 ml    ASSESSMENT AND PLAN:  CHEST PAIN:    Troponin x 3 negative.  He does not feel like this is cardiac.  There are other possible etiologies (esophageal).  No cath planned.  OK to start diet.    ACUTE ON CHRONIC SYSTOLIC AND DIASTOLIC:    Given IV Lasix early this am.    HYPERTENSION:   BP OK.  No change in therapy.    CHRONIC ATRIAL FIB:   If no further invasive procedures planned per primary team, OK to restart warfarin.     Rollene RotundaJames Khaled Herda 09/18/2013 3:56 PM

## 2013-09-18 NOTE — Consult Note (Signed)
CARDIOLOGY CONSULT NOTE  Patient ID: Bruce Taylor MRN: [id number] DOB/AGE: 75/02/1941 75 y.o.  Admit date: 09/17/2013 Primary Cardiologist: Dr Tresa EndoKelly Reason for Consultation: Dyspnea, chest pain, elevated troponin  HPI: 73 yo with history of CAD, chronic atrial fibrillation, COPD on home oxygen, and diastolic CHF presented with chest pain, back pain, and nausea. He was found to be hypoxic.  Patient was in his usual state of health until this afternoon.  He lives with his daughter. He is on 2 L home oxygen and has chronic dyspnea with moderate exertion.  He laid down this afternoon to take a nap.  He felt normal.  When he woke up around 3:30 pm, he felt "bad."  He had prominent mid-back pain but also lower substernal chest pain/burning.  He was nauseated and vomited.  The pain in his lower chest and back as well as the nausea persisted so daughter brought him to ER.  The pain has gradually subsided but is still present mildly.  He has NTG paste on currently.  He says that the pain does not feel like prior cardiac pain and actually reminds him more of the symptoms that he was having with his esophagitis admission in 1/15.  He did not feel any more short of breath than usual, but in the ER he was noted to have oxygen saturation in the upper 80s on his home 2L [place].  He was put on a venti-mask with rise on oxygen saturation to 95%.  CTA chest showed now PE or dissection but there was possible interstitial edema.  ECG shows atrial fibrillation with chronic RBBB, no changes.  Initial POC troponin was negative but 2nd POC troponin was borderline positive at 0.11.   Review of systems complete and found to be negative unless listed above in HPI  Past Medical History: 1. CAD: Status post multiple PCIs to LAD, D, OM, proximal RCA, mid RCA.  Most recent cath 11/02 with cutting balloon angioplasty to in-stent restenosis in RCA.  Cardiolite in 9/14 showed no ischemia.  2. Diastolic CHF: Echo (10/13) with EF  > 50%, moderate LVH, normal RV.  3. COPD on home oxygen.  Prior smoker.  4. HTN 5. Hyperlipidemia 6. PAD 7. Obesity 8. GERD with EGD in 1/15 showing distal esophagitis 9. Tremor 10. PCM: [employer] 11. Atrial fibrillation: Permanent. 12. OSA: on CPAP. 13. Chronic constipation  Family History  Problem Relation Age of Onset  . Heart disease Father   . Heart disease Brother   . Uterine cancer Mother   . Diabetes Sister     History   Social History  . Marital Status: Married    Spouse Name: N/A    Number of Children: N/A  . Years of Education: N/A   Occupational History  . retired [employer] architecttruck driver    Social History Main Topics  . Smoking status: Former Smoker -- 2.00 packs/day for 52 years    Types: Cigarettes    Quit date: 07/26/2006  . Smokeless tobacco: Never Used  . Alcohol Use: Yes     Comment: 07/13/2012"did drink at one time for 5 years; stopped ~ 1980"  . Drug Use: No  . Sexual Activity: No   Other Topics Concern  . Not on file   Social History Narrative  . No narrative on file    Physical exam Blood pressure 101/65, pulse 55, resp. rate 23, SpO2 94.00%. General: NAD, obese Neck: Thick, JVP difficult but suspect at least mildly elevated around 9-10 cm,  no thyromegaly or thyroid nodule.  Lungs: Somewhat distant breath sounds but no wheezing noted. CV: Nondisplaced PMI.  Heart irregular S1/S2, no S3/S4, no murmur.  1+ ankle edema.  No carotid bruit.  Difficult to palpate pedal pulses.  Abdomen: Soft, nontender, no hepatosplenomegaly, no distention.  Skin: Intact without lesions or rashes.  Neurologic: Alert and oriented x 3.  Psych: Normal affect. Extremities: No clubbing or cyanosis.  HEENT: Normal.   Labs:   Lab Results  Component Value Date   WBC 11.0* 09/17/2013   HGB 12.3* 09/17/2013   HCT 37.6* 09/17/2013   MCV 91.7 09/17/2013   PLT 159 09/17/2013    Recent Labs Lab 09/17/13 2017  NA 140  K 4.3  CL 103  CO2 21  BUN 14  CREATININE 1.27    CALCIUM 9.1  PROT 7.8  BILITOT 0.5  ALKPHOS 95  ALT 10  AST 20  GLUCOSE 133*  TnI 0.03 => 0.11 INR 1.5   Radiology: - CTA chest: No PE, possible interstitial edema  EKG: Atrial fibrillation with RBBB, same as prior  ASSESSMENT AND PLAN:  73 yo with history of CAD, chronic atrial fibrillation, COPD on home oxygen, and diastolic CHF presented with chest pain/back pain/nausea and was found to be hypoxic. 1.  CAD: Patient has extensive CAD history with multiple stents.  He woke up from a nap today with nausea, vomiting, lower chest and back pain.  He says this reminds him more of the pain he had with esophagitis admission in 1/15 than with prior cardiac pain.  However, the second point-of-care TnI was borderline positive (0.11).  It is possible that this presentation is due to ACS.  However, COPD and esophagitis are also possible etiologies for current presentation.  I suspect he has some co-existing CHF.  No PE on CTA chest.  - Would cycle cardiac enzymes.  I will send a regular lab troponin now.  - INR 1.5, will start heparin gtt. Hold coumadin until we see if cardiac cath will be needed.  - Continue ASA 81, statin.  - May continue NTG paste for now.  - Follow symptoms and cardiac enzyme trend.  He may need cardiac cath.  Will keep NPO and will need to make decision in am.  2. Acute on chronic diastolic CHF: Patient does appear volume overloaded on exam (though difficult) with pulmonary edema on CTA chest. I suspect this plays a role in his symptomatology.  He is requiring a venti-mask to keep oxygen saturation up.  - Lasix 40 mg IV bid, give first dose now.  - Will check echo and BNP.  3. Atrial fibrillation: Chronic, reasonable rate control.  Can continue metoprolol.  Hold coumadin and use heparin gtt in case cardiac cath needed.  4. H/o esophagitis: Pain somewhat reminiscent of symptoms from 1/15 when he was admitted with esophagitis.  Will need PPI.  5. COPD: On home oxygen.  Oxygen  saturation low here, requiring venti-mask.  No PNA on CT.  He got Solumedrol IV already in the ER.  COPD exacerbation is possible but he is not actively wheezing.   Marca Ancona 09/18/2013 1:36 AM

## 2013-09-18 NOTE — ED Notes (Signed)
Attempt to call for report. RN to call this nurse back.

## 2013-09-18 NOTE — Progress Notes (Signed)
ANTICOAGULATION CONSULT NOTE - Initial Consult  Pharmacy Consult for Heparin  Indication: chest pain/ACS  Allergies  Allergen Reactions  . Penicillins Anaphylaxis and Swelling    "swell up all over my body; throat mainly, I think" (07/13/2012)  . Chlorhexidine Gluconate Itching    Chg cloth "don't remember how bad it was" (07/13/2012)   Patient Measurements: HDW: ~100 kg  Vital Signs: BP: 101/65 mmHg (02/23 2047) Pulse Rate: 55 (02/23 2047)  Labs:  Recent Labs  09/17/13 09/17/13 2017  HGB  --  12.3*  HCT  --  37.6*  PLT  --  159  APTT  --  42*  LABPROT  --  18.3*  INR 1.6 1.57*  CREATININE  --  1.27    The CrCl is unknown because both a height and weight (above a minimum accepted value) are required for this calculation.   Medical History: Past Medical History  Diagnosis Date  . Hyperlipidemia   . Hypertension   . 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 Chronic atrial fibrillation   . Ischemic cardiomyopathy     EF 45% by echo 2010  . COPD (chronic obstructive pulmonary disease) 02/04/2010  . On home O2     "24/7"  . Renal insufficiency   . Tremor   . Angina pectoris   . Obesity, morbid 06/15/2011  . Coronary artery disease     Multiple PCIs  . Myocardial infarct ? 1995  . Blood transfusion ?2011    "hemorrhaging out my penis" (07/13/2012)  . Anemia   . Ulcer     "don't know what kind; it was years ago"  . CHF (congestive heart failure) 04/29/2009    2D Echo - EF 45-50%, normal  . Shortness of breath     "all the time" (07/13/2012)  . Obstructive sleep apnea on CPAP   . Type II diabetes mellitus   . GERD (gastroesophageal reflux disease)   . Stomach ulcer 1980's  . Arthritis     "lower back" (07/13/2012)  . Chronic lower back pain   . Hematuria ? 2011  . PAD (peripheral artery disease) 05/09/2012    Lower Arterial Exam - Right and left distal external iliac arteries-equal to or less than 50% diameter reductions, Bilateral SFA-suggestive  50-69% diameter reduction,right posterior tibial artery and left peroneal artery both occluded  . Bruit 07/23/2010    Carotid Duplex - Right and left ICAs demonstrate small amount of fibrous plaque not hemodynamically significant  . Complication of anesthesia     pt states was very difficult to wake up  . Pacemaker 2010    Assessment: 73 y/o M to start heparin for CP, slightly elevated troponin, noted warfarin PTA but INR on admit is 1.57, so will begin heparin. Other labs as above.   Goal of Therapy:  Heparin level 0.3-0.7 units/ml Monitor platelets by anticoagulation protocol: Yes   Plan:  -Start heparin drip at 1300 units/hr (no bolus with elevated INR) -1100 HL -Daily CBC/HL -Monitor for bleeding  Abran DukeLedford, Taite Baldassari 09/18/2013,2:10 AM

## 2013-09-18 NOTE — Progress Notes (Signed)
ANTICOAGULATION CONSULT NOTE   Pharmacy Consult for Heparin  Indication: chest pain/ACS  Allergies  Allergen Reactions  . Penicillins Anaphylaxis and Swelling    "swell up all over my body; throat mainly, I think" (07/13/2012)  . Chlorhexidine Gluconate Itching    Chg cloth "don't remember how bad it was" (07/13/2012)   Patient Measurements: HDW: ~100 kg  Vital Signs: Temp: 97.3 F (36.3 C) (02/24 0700) Temp src: Oral (02/24 0700) BP: 118/68 mmHg (02/24 0732) Pulse Rate: 56 (02/24 0732)  Labs:  Recent Labs  09/17/13 09/17/13 2017 09/18/13 0217 09/18/13 0710 09/18/13 1132  HGB  --  12.3*  --  12.8*  --   HCT  --  37.6*  --  38.5*  --   PLT  --  159  --  166  --   APTT  --  42*  --   --   --   LABPROT  --  18.3*  --   --   --   INR 1.6 1.57*  --   --   --   HEPARINUNFRC  --   --   --   --  0.53  CREATININE  --  1.27  --  1.20  --   TROPONINI  --   --  <0.30 <0.30  --     The CrCl is unknown because both a height and weight (above a minimum accepted value) are required for this calculation.   Medical History: Past Medical History  Diagnosis Date  . Hyperlipidemia   . Hypertension   . 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 Chronic atrial fibrillation   . Ischemic cardiomyopathy     EF 45% by echo 2010  . COPD (chronic obstructive pulmonary disease) 02/04/2010  . On home O2     "24/7"  . Renal insufficiency   . Tremor   . Angina pectoris   . Obesity, morbid 06/15/2011  . Coronary artery disease     Multiple PCIs  . Myocardial infarct ? 1995  . Blood transfusion ?2011    "hemorrhaging out my penis" (07/13/2012)  . Anemia   . Ulcer     "don't know what kind; it was years ago"  . CHF (congestive heart failure) 04/29/2009    2D Echo - EF 45-50%, normal  . Shortness of breath     "all the time" (07/13/2012)  . Obstructive sleep apnea on CPAP   . Type II diabetes mellitus   . GERD (gastroesophageal reflux disease)   . Stomach ulcer 1980's  .  Arthritis     "lower back" (07/13/2012)  . Chronic lower back pain   . Hematuria ? 2011  . PAD (peripheral artery disease) 05/09/2012    Lower Arterial Exam - Right and left distal external iliac arteries-equal to or less than 50% diameter reductions, Bilateral SFA-suggestive 50-69% diameter reduction,right posterior tibial artery and left peroneal artery both occluded  . Bruit 07/23/2010    Carotid Duplex - Right and left ICAs demonstrate small amount of fibrous plaque not hemodynamically significant  . Complication of anesthesia     pt states was very difficult to wake up  . Pacemaker 2010    Assessment: 73 y/o M to start heparin for CP, slightly elevated troponin, noted warfarin PTA but INR on admit is 1.57. Initial heparin level is at goal, no bleeding issues noted. Continue to hold warfarin with possible procedure planned.  Goal of Therapy:  Heparin level 0.3-0.7 units/ml Monitor  platelets by anticoagulation protocol: Yes   Plan:  -Continue heparin at 1300 units/hr -Daily CBC/HL -Monitor for bleeding  Sheppard CoilFrank Wilson PharmD., BCPS Clinical Pharmacist Pager [phone] 09/18/2013 1:19 PM

## 2013-09-19 ENCOUNTER — Inpatient Hospital Stay (HOSPITAL_COMMUNITY): Payer: Medicare Other

## 2013-09-19 LAB — CBC
HEMATOCRIT: 36.5 % — AB (ref 39.0–52.0)
HEMOGLOBIN: 12.2 g/dL — AB (ref 13.0–17.0)
MCH: 30.5 pg (ref 26.0–34.0)
MCHC: 33.4 g/dL (ref 30.0–36.0)
MCV: 91.3 fL (ref 78.0–100.0)
RBC: 4 MIL/uL — AB (ref 4.22–5.81)
RDW: 16.9 % — AB (ref 11.5–15.5)
WBC: 14.3 10*3/uL — AB (ref 4.0–10.5)

## 2013-09-19 LAB — GLUCOSE, CAPILLARY
GLUCOSE-CAPILLARY: 119 mg/dL — AB (ref 70–99)
GLUCOSE-CAPILLARY: 184 mg/dL — AB (ref 70–99)
GLUCOSE-CAPILLARY: 115 mg/dL — AB (ref 70–99)
GLUCOSE-CAPILLARY: 130 mg/dL — AB (ref 70–99)
Glucose-Capillary: 165 mg/dL — ABNORMAL HIGH (ref 70–99)
Glucose-Capillary: 148 mg/dL — ABNORMAL HIGH (ref 70–99)

## 2013-09-19 LAB — HEPARIN LEVEL (UNFRACTIONATED): HEPARIN UNFRACTIONATED: 0.41 [IU]/mL (ref 0.30–0.70)

## 2013-09-19 LAB — COMPREHENSIVE METABOLIC PANEL
ALT: 8 U/L (ref 0–53)
AST: 17 U/L (ref 0–37)
Albumin: 3.1 g/dL — ABNORMAL LOW (ref 3.5–5.2)
Alkaline Phosphatase: 82 U/L (ref 39–117)
BUN: 20 mg/dL (ref 6–23)
CO2: 21 meq/L (ref 19–32)
CREATININE: 1.2 mg/dL (ref 0.50–1.35)
Calcium: 9 mg/dL (ref 8.4–10.5)
Chloride: 96 mEq/L (ref 96–112)
GFR, EST AFRICAN AMERICAN: 67 mL/min — AB (ref >90)
GFR, EST NON AFRICAN AMERICAN: 58 mL/min — AB (ref >90)
GLUCOSE: 120 mg/dL — AB (ref 70–99)
POTASSIUM: 3.6 meq/L — AB (ref 3.7–5.3)
Sodium: 135 mEq/L — ABNORMAL LOW (ref 137–147)
Total Bilirubin: 0.4 mg/dL (ref 0.3–1.2)
Total Protein: 7.3 g/dL (ref 6.0–8.3)

## 2013-09-19 MED ORDER — LEVOFLOXACIN IN D5W 750 MG/150ML IV SOLN
750.0000 mg | Freq: Every day | INTRAVENOUS | Status: DC
Start: 2013-09-19 — End: 2013-09-21
  Administered 2013-09-19 – 2013-09-20 (×2): 750 mg via INTRAVENOUS
  Filled 2013-09-19 (×3): qty 150

## 2013-09-19 MED ORDER — WARFARIN - PHARMACIST DOSING INPATIENT: Freq: Every day | Status: DC

## 2013-09-19 MED ORDER — POTASSIUM CHLORIDE CRYS ER 20 MEQ PO TBCR
40.0000 meq | EXTENDED_RELEASE_TABLET | Freq: Once | ORAL | Status: AC
  Administered 2013-09-19: 40 meq via ORAL
  Filled 2013-09-19: qty 2

## 2013-09-19 MED ORDER — WARFARIN SODIUM 10 MG PO TABS
10.0000 mg | ORAL_TABLET | Freq: Once | ORAL | Status: AC
  Administered 2013-09-19: 10 mg via ORAL
  Filled 2013-09-19: qty 1

## 2013-09-19 NOTE — Plan of Care (Signed)
Problem: Acute Rehab PT Goals(only PT should resolve) Goal: Pt Will Transfer Bed To Chair/Chair To Bed With RW     

## 2013-09-19 NOTE — Evaluation (Signed)
Physical Therapy Evaluation Patient Details Name: Bruce BrooklynWilliam E Lavallee MRN: [id number] DOB: 06/27/1941 Today's Date: 09/19/2013 Time: 1030-1053 PT Time Calculation (min): 23 min  PT Assessment / Plan / Recommendation History of Present Illness  Bruce Taylor is a 73 y.o. 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 Patient also had mild shortness of breath with nausea and vomiting.  In the ER patient had CT angiogram of the chest which showed features concerning for CHF.  Clinical Impression  Pt adm due to the above. Presents with decreased functional mobility and overall deconditioning. Pt to benefit from skilled acute PT to address deficits listed below (see PT problem list). Pt has been requiring an aid and total (A) for all mobility and ADLs prior to admission. At this time the daughter is unable to provide total (A) upon acute D/C. Pt will require SNF for post acute rehab upon D/C. Pt very self limiting and requires max encouragement for OOB activity.     PT Assessment  Patient needs continued PT services    Follow Up Recommendations  SNF;Supervision/Assistance - 24 hour    Does the patient have the potential to tolerate intense rehabilitation      Barriers to Discharge Decreased caregiver support Daughter unable to (A) pt at this time; pt will require SNF     Equipment Recommendations  None recommended by PT    Recommendations for Other Services     Frequency Min 2X/week    Precautions / Restrictions     Pertinent Vitals/Pain Stable. No c/o pain.      Mobility  Bed Mobility Overal bed mobility: Needs Assistance;+2 for physical assistance Bed Mobility: Rolling;Sidelying to Sit Rolling: Mod assist Sidelying to sit: Max assist;HOB elevated General bed mobility comments: pt (A) in rolling for perineal  hygiene; requires incr time but is able to (A) with mobility with max directional cues; (A) to elevate trunk and fully bring LEs off bed  Transfers Overall transfer level: Needs assistance Equipment used: 2 person hand held assist Transfers: Sit to/from Stand;Stand Pivot Transfers Sit to Stand: From elevated surface;+2 physical assistance;Max assist Stand pivot transfers: From elevated surface;+2 physical assistance;Max assist General transfer comment: pt was able to WB through LEs without any buckling noted; requires max directional cues and encouragement to perform pivotal steps; pt with fwd flex posture throughout transfer; unable to achieve full upright posture; pt very unsteady with standing and transfers          PT Diagnosis: Difficulty walking;Generalized weakness  PT Problem List: Decreased strength;Decreased balance;Decreased mobility;Decreased cognition;Decreased knowledge of use of DME;Decreased safety awareness;Obesity;Cardiopulmonary status limiting activity PT Treatment Interventions: DME instruction;Gait training;Functional mobility training;Therapeutic activities;Therapeutic exercise;Balance training;Neuromuscular re-education;Wheelchair mobility training;Patient/family education     PT Goals(Current goals can be found in the care plan section) Acute Rehab PT Goals Patient Stated Goal: i just dont know what i want  PT Goal Formulation: With patient Time For Goal Achievement: 10/03/13 Potential to Achieve Goals: Fair  Visit Information  Last PT Received On: 09/19/13 Assistance Needed: +2 History of Present Illness: Bruce BrooklynWilliam E Duff is a 73 y.o. 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 Patient also had mild shortness of breath with  nausea and vomiting.  In the ER patient had CT angiogram of the chest  which showed features concerning for CHF.       Prior Functioning  Home Living Family/patient expects to be discharged to:: Skilled nursing facility Additional Comments: pt has been living with Daughter; Daughter is unable to provide total (A) for pt like she has been due to her own person health and committments; pt will need SNF for D/C  Prior Function Level of Independence: Needs assistance Gait / Transfers Assistance Needed: pt reports he has been predominately in wheelchair since his wife passed away; reports he would stand with (A) and pivot to chair only  ADL's / Homemaking Assistance Needed: pt has been total (A) since September per pt  Comments: on 3L o2 at home  Communication Communication: No difficulties    Cognition  Cognition Arousal/Alertness: Awake/alert Behavior During Therapy: WFL for tasks assessed/performed Overall Cognitive Status: Impaired/Different from baseline Area of Impairment: Following commands;Safety/judgement;Problem solving;Orientation Orientation Level: Disoriented to;Time Memory: Decreased short-term memory Following Commands: Follows one step commands with increased time;Follows one step commands inconsistently Safety/Judgement: Decreased awareness of deficits;Decreased awareness of safety Problem Solving: Slow processing;Difficulty sequencing;Requires verbal cues;Requires tactile cues General Comments: pt very self limiting and requires max directional cues for all mobility; pt is a poor historian and unable to provide a clear picture of PLOF     Extremity/Trunk Assessment Upper Extremity Assessment Upper Extremity Assessment: Defer to OT evaluation Lower Extremity Assessment Lower Extremity Assessment: Generalized weakness Cervical / Trunk Assessment Cervical / Trunk Assessment: Kyphotic   Balance Balance Overall balance assessment: Needs assistance;History of Falls Sitting-balance support: Feet supported;Single extremity supported Sitting  balance-Leahy Scale: Fair Sitting balance - Comments: tolerated sitting EOB at S level ~5 min  Standing balance support: During functional activity;Bilateral upper extremity supported Standing balance-Leahy Scale: Zero Standing balance comment: requires 2+ (A) and maintains fwd flex posture; very fearful of falling   End of Session PT - End of Session Equipment Utilized During Treatment: Gait belt;Oxygen Activity Tolerance: Patient limited by fatigue Patient left: in chair;with call bell/phone within reach;with nursing/sitter in room Nurse Communication: Need for lift equipment;Mobility status;Precautions  GP     Donnamarie Poag [place], [place] [phone] 09/19/2013, 1:08 PM

## 2013-09-19 NOTE — Progress Notes (Addendum)
ANTICOAGULATION CONSULT NOTE   Pharmacy Consult for Heparin >> warfarin  Indication: chest pain/ACS  Allergies  Allergen Reactions  . Penicillins Anaphylaxis and Swelling    "swell up all over my body; throat mainly, I think" (07/13/2012)  . Chlorhexidine Gluconate Itching    Chg cloth "don't remember how bad it was" (07/13/2012)   Patient Measurements: HDW: ~100 kg  Vital Signs: Temp: 97.4 F (36.3 C) (02/25 0749) Temp src: Oral (02/25 0749) BP: 97/62 mmHg (02/25 0350) Pulse Rate: 55 (02/25 0350)  Labs:  Recent Labs  09/17/13  09/17/13 2017  09/18/13 0710 09/18/13 1132 09/18/13 1247 09/18/13 1430 09/18/13 2041 09/19/13 0256  HGB  --   < > 12.3*  --  12.8*  --   --   --   --  12.2*  HCT  --   --  37.6*  --  38.5*  --   --   --   --  36.5*  PLT  --   --  159  --  166  --   --   --   --  PLATELET CLUMPS NOTED ON SMEAR  APTT  --   --  42*  --   --   --   --   --   --   --   LABPROT  --   --  18.3*  --   --   --   --   --   --   --   INR 1.6  --  1.57*  --   --   --   --   --   --   --   HEPARINUNFRC  --   --   --   --   --  0.53  --   --   --  0.41  CREATININE  --   --  1.27  --  1.20  --   --   --   --  1.20  TROPONINI  --   --   --   < > <0.30  --  <0.30 <0.30 <0.30  --   < > = values in this interval not displayed.  Estimated Creatinine Clearance: 67.7 ml/min (by C-G formula based on Cr of 1.2).   Medical History: Past Medical History  Diagnosis Date  . Hyperlipidemia   . Hypertension   . 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 Chronic atrial fibrillation   . Ischemic cardiomyopathy     EF 45% by echo 2010  . COPD (chronic obstructive pulmonary disease) 02/04/2010  . On home O2     "24/7"  . Renal insufficiency   . Tremor   . Angina pectoris   . Obesity, morbid 06/15/2011  . Coronary artery disease     Multiple PCIs  . Myocardial infarct ? 1995  . Blood transfusion ?2011    "hemorrhaging out my penis" (07/13/2012)  . Anemia   . Ulcer    "don't know what kind; it was years ago"  . CHF (congestive heart failure) 04/29/2009    2D Echo - EF 45-50%, normal  . Shortness of breath     "all the time" (07/13/2012)  . Obstructive sleep apnea on CPAP   . Type II diabetes mellitus   . GERD (gastroesophageal reflux disease)   . Stomach ulcer 1980's  . Arthritis     "lower back" (07/13/2012)  . Chronic lower back pain   . Hematuria ? 2011  . PAD (peripheral  artery disease) 05/09/2012    Lower Arterial Exam - Right and left distal external iliac arteries-equal to or less than 50% diameter reductions, Bilateral SFA-suggestive 50-69% diameter reduction,right posterior tibial artery and left peroneal artery both occluded  . Bruit 07/23/2010    Carotid Duplex - Right and left ICAs demonstrate small amount of fibrous plaque not hemodynamically significant  . Complication of anesthesia     pt states was very difficult to wake up  . Pacemaker 2010    Assessment: 73 y/o M with history of afib; have been holding warfarin for possible procedure but now plans to resume tonight. Heparin gtt continues to be at goal, no bleeding issues noted, cbc stable.  Stop heparin when INR>2  Goal of Therapy:  INR goal 2-3 Heparin level 0.3-0.7 units/ml Monitor platelets by anticoagulation protocol: Yes   Plan:  -Continue heparin at 1300 units/hr -Warfarin 10mg  tonight -Daily CBC/HL -Monitor for bleeding  Sheppard CoilFrank Dynastee Brummell PharmD., BCPS Clinical Pharmacist Pager [phone] 09/19/2013 8:25 AM  Addendum:  Possible infiltrate noted on CT scan of chest. Starting empiric levaquin for possible community acquired pneumonia. No fevers although slight leukocytosis noted. Renal function ok for daily dosing.  Plan Levaquin 750mg  q24h  Severiano GilbertWilson, Consuela Widener Rhea  09/19/2013 12:53 PM

## 2013-09-19 NOTE — Progress Notes (Signed)
Patient Name: Bruce Taylor Date of Encounter: 09/19/2013  Principal Problem:   Acute respiratory failure Active Problems:   HYPERTENSION   COPD (chronic obstructive pulmonary disease) with emphysema   Unstable angina   DM (diabetes mellitus), type 2 with complications   Chronic a-fib    SUBJECTIVE: Breathing better today, had a hard night. Very nauseated, thinks breakfast sausage responsible.  OBJECTIVE Filed Vitals:   09/19/13 0749 09/19/13 0821 09/19/13 1005 09/19/13 1034  BP: 102/55   134/68  Pulse: 55   55  Temp: 97.4 F (36.3 C)     TempSrc: Oral     Resp: 14   13  Height:      Weight:      SpO2: 95% 97% 93% 96%    Intake/Output Summary (Last 24 hours) at 09/19/13 1115 Last data filed at 09/19/13 0800  Gross per 24 hour  Intake   1061 ml  Output   2350 ml  Net  -1289 ml   Filed Weights   09/18/13 1400  Weight: 246 lb 14.6 oz (112 kg)    PHYSICAL EXAM General: Well developed, well nourished, male in no acute distress. Head: Normocephalic, atraumatic.  Neck: Supple without bruits, JVD 8 cm. Lungs:  Resp regular and unlabored, decreased BS bases w/ some rales. Heart: RRR, S1, S2, no S3, S4, soft murmur; no rub. Abdomen: Soft, non-tender, non-distended, BS + x 4.  Extremities: No clubbing, cyanosis, trace edema.  Neuro: Alert and oriented X 3. Moves all extremities spontaneously. Psych: Normal affect.  LABS: CBC: Recent Labs  09/18/13 0710 09/19/13 0256  WBC 8.1 14.3*  NEUTROABS 7.6  --   HGB 12.8* 12.2*  HCT 38.5* 36.5*  MCV 91.9 91.3  PLT 166 PLATELET CLUMPS NOTED ON SMEAR   INR: Recent Labs  09/17/13 2017  INR 1.57*   Basic Metabolic Panel: Recent Labs  09/17/13 2017 09/18/13 0710 09/19/13 0256  NA 140 137 135*  K 4.3 4.4 3.6*  CL 103 100 96  CO2 21 19 21   GLUCOSE 133* 187* 120*  BUN 14 15 20   CREATININE 1.27 1.20 1.20  CALCIUM 9.1 9.0 9.0  MG 1.7  --   --    Liver Function Tests: Recent Labs  09/18/13 0710  09/19/13 0256  AST 19 17  ALT 10 8  ALKPHOS 93 82  BILITOT 0.5 0.4  PROT 7.7 7.3  ALBUMIN 3.3* 3.1*   Cardiac Enzymes: Recent Labs  09/18/13 1247 09/18/13 1430 09/18/13 2041  TROPONINI <0.30 <0.30 <0.30    Recent Labs  09/17/13 2027 09/18/13 0043  TROPIPOC 0.03 0.11*   BNP: Pro B Natriuretic peptide (BNP)  Date/Time Value Ref Range Status  09/18/2013  7:10 AM 5013.0* 0 - 125 pg/mL Final  09/18/2013  2:17 AM 4460.0* 0 - 125 pg/mL Final   Thyroid Function Tests: Recent Labs  09/18/13 0710  TSH 1.359   TELE:    Atrial fib, v pacing    ECHO: 09/18/2013 Study Conclusions - Left ventricle: The cavity size was normal. Wall thickness was normal. Systolic function was normal. The estimated ejection fraction was in the range of 50% to 55%. Wall motion was normal; there were no regional wall motion abnormalities. The study is not technically sufficient to allow evaluation of LV diastolic function. - Ventricular septum: Septal motion showed paradox. These changes are consistent with right ventricular pacing. - Left atrium: The atrium was severely dilated. - Right ventricle: The cavity size was mildly dilated. Systolic  function was moderately to severely reduced. - Right atrium: The atrium was moderately dilated. - Pulmonary arteries: Systolic pressure was mildly to moderately increased. PA peak pressure: 46mm Hg (S).   Radiology/Studies: Ct Angio Chest W/cm &/or Wo Cm 09/18/2013   CLINICAL DATA:  73 year old male with chest pain, nausea, vomiting and abdominal/pelvic pain.  EXAM: CT ANGIOGRAPHY CHEST  CT ABDOMEN AND PELVIS WITH CONTRAST  TECHNIQUE: Multidetector CT imaging of the chest was performed using the standard protocol during bolus administration of intravenous contrast. Multiplanar CT image reconstructions and MIPs were obtained to evaluate the vascular anatomy. Multidetector CT imaging of the abdomen and pelvis was performed using the standard protocol during  bolus administration of intravenous contrast.  CONTRAST:  OMNIPAQUE IOHEXOL 350 MG/ML SOLN  COMPARISON:  08/09/2013 and prior abdominal pelvic CTs.  FINDINGS: CTA CHEST FINDINGS  This is a technically adequate study although respiratory motion artifact decreases sensitivity in several areas.  There is no evidence of pulmonary emboli. The central pulmonary arteries are enlarged compatible with pulmonary arterial hypertension.  Cardiomegaly and heavy coronary artery calcifications are present.  There is no evidence of thoracic aortic aneurysm.  A small left pleural effusion and miniscule right pleural effusion noted.  Mild to moderate bibasilar atelectasis is present.  Mild central ground-glass opacities may represent pulmonary edema.  There is no evidence of consolidation or mass.  No acute or suspicious bony abnormalities are present.  Review of the MIP images confirms the above findings.  CT ABDOMEN and PELVIS FINDINGS  Cirrhosis identified without focal hepatic mass.  Cholelithiasis identified without CT evidence of cholecystitis.  Spleen, pancreas, adrenal glands are unremarkable.  Bilateral renal cortical thinning and renal cysts again noted.  There is no evidence of free fluid, enlarged lymph nodes, biliary dilation or abdominal aortic aneurysm.  The bowel and bladder are unremarkable.  No acute or suspicious bony abnormalities are identified. Degenerative changes with throughout the lumbar spine again noted.  IMPRESSION: No evidence of pulmonary emboli or aortic aneurysm.  Cardiomegaly with small bilateral pleural effusions, bibasilar atelectasis and question interstitial pulmonary edema.  Cirrhosis.  Cholelithiasis.  Coronary artery disease.   Electronically Signed   By: Laveda Abbe M.D.   On: 09/18/2013 00:51   Ct Abdomen Pelvis W Contrast 09/18/2013   CLINICAL DATA:  73 year old male with chest pain, nausea, vomiting and abdominal/pelvic pain.  EXAM: CT ANGIOGRAPHY CHEST  CT ABDOMEN AND PELVIS WITH  CONTRAST  TECHNIQUE: Multidetector CT imaging of the chest was performed using the standard protocol during bolus administration of intravenous contrast. Multiplanar CT image reconstructions and MIPs were obtained to evaluate the vascular anatomy. Multidetector CT imaging of the abdomen and pelvis was performed using the standard protocol during bolus administration of intravenous contrast.  CONTRAST:  OMNIPAQUE IOHEXOL 350 MG/ML SOLN  COMPARISON:  08/09/2013 and prior abdominal pelvic CTs.  FINDINGS: CTA CHEST FINDINGS  This is a technically adequate study although respiratory motion artifact decreases sensitivity in several areas.  There is no evidence of pulmonary emboli. The central pulmonary arteries are enlarged compatible with pulmonary arterial hypertension.  Cardiomegaly and heavy coronary artery calcifications are present.  There is no evidence of thoracic aortic aneurysm.  A small left pleural effusion and miniscule right pleural effusion noted.  Mild to moderate bibasilar atelectasis is present.  Mild central ground-glass opacities may represent pulmonary edema.  There is no evidence of consolidation or mass.  No acute or suspicious bony abnormalities are present.  Review of the MIP  images confirms the above findings.  CT ABDOMEN and PELVIS FINDINGS  Cirrhosis identified without focal hepatic mass.  Cholelithiasis identified without CT evidence of cholecystitis.  Spleen, pancreas, adrenal glands are unremarkable.  Bilateral renal cortical thinning and renal cysts again noted.  There is no evidence of free fluid, enlarged lymph nodes, biliary dilation or abdominal aortic aneurysm.  The bowel and bladder are unremarkable.  No acute or suspicious bony abnormalities are identified. Degenerative changes with throughout the lumbar spine again noted.  IMPRESSION: No evidence of pulmonary emboli or aortic aneurysm.  Cardiomegaly with small bilateral pleural effusions, bibasilar atelectasis and question  interstitial pulmonary edema.  Cirrhosis.  Cholelithiasis.  Coronary artery disease.   Electronically Signed   By: Laveda Abbe M.D.   On: 09/18/2013 00:51   Dg Chest Port 1 View 09/18/2013   CLINICAL DATA:  Shortness of breath, history hypertension, ischemic cardiomyopathy, COPD, coronary artery disease, CHF, diabetes, pacemaker  EXAM: PORTABLE CHEST - 1 VIEW  COMPARISON:  Portable exam 0713 hr compared to 09/17/2013  FINDINGS: Left subclavian central pacemaker lead projects over right ventricle.  Enlargement of cardiac silhouette with pulmonary vascular congestion.  Bibasilar opacities could represent atelectasis or developing consolidation.  No gross pleural effusion or pneumothorax.  Bones demineralized.  IMPRESSION: Enlargement of cardiac silhouette with pulmonary vascular congestion.  Bibasilar opacities question atelectasis versus developing consolidation.   Electronically Signed   By: Ulyses Southward M.D.   On: 09/18/2013 08:31   Dg Abd Acute W/chest 09/17/2013   CLINICAL DATA:  Abdominal and chest pain for 2 days.  EXAM: ACUTE ABDOMEN SERIES (ABDOMEN 2 VIEW & CHEST 1 VIEW)  COMPARISON:  08/21/2013  FINDINGS: Patient's left-sided transvenous pacemaker with leads to the right ventricle. The heart is enlarged. There is pulmonary vascular congestion without overt edema. No definite free intraperitoneal air. Bowel gas pattern is nonobstructive. There is mild gaseous distension of the stomach.  IMPRESSION: 1. Cardiomegaly.  Pulmonary vascular congestion. 2. Nonobstructive bowel gas pattern.   Electronically Signed   By: Rosalie Gums M.D.   On: 09/17/2013 21:33     Current Medications:  . allopurinol  300 mg Oral Daily  . aspirin EC  325 mg Oral Daily  . atorvastatin  80 mg Oral q1800  . budesonide  0.5 mg Nebulization BID  . citalopram  40 mg Oral Daily  . furosemide  40 mg Intravenous BID  . gabapentin  300 mg Oral Daily  . insulin aspart  0-9 Units Subcutaneous 6 times per day  . insulin aspart  protamine- aspart  15 Units Subcutaneous BID WC  . ipratropium-albuterol  3 mL Nebulization Q6H  . isosorbide mononitrate  60 mg Oral Daily  . levofloxacin (LEVAQUIN) IV  750 mg Intravenous Daily  . linagliptin  5 mg Oral Daily  . magnesium oxide  400 mg Oral Daily  . metoprolol tartrate  12.5 mg Oral BID  . pantoprazole  40 mg Oral Daily  . sodium chloride  3 mL Intravenous Q12H  . spironolactone  12.5 mg Oral BID  . warfarin  10 mg Oral ONCE-1800  . Warfarin - Pharmacist Dosing Inpatient   Does not apply q1800   . heparin 1,300 Units/hr (09/19/13 0600)    ASSESSMENT AND PLAN: Principal Problem:   Acute respiratory failure - SOB improving, but still with some extra volume. ABX per primary MD.    Possible acute on chronic diastolic CHF - see echo report, above.  May be able to change to PO  Lasix in am. Continue to follow weight, but he is a little below previous baseline. Supp K+  Active Problems:   HYPERTENSION   COPD (chronic obstructive pulmonary disease) with emphysema   Unstable angina   DM (diabetes mellitus), type 2 with complications   Chronic a-fib - pacing appropriately,    Signed, Theodore Demarkhonda Barrett , PA-C 11:15 AM 09/19/2013  History and all data above reviewed.  Patient examined.  I agree with the findings as above.  He is uncomfortable and says that his breathing is not better.  However, he is not having any chest pain.  This has "about gone"  The patient exam reveals GNF:[id number]:Regular  ,  Lungs: Decreased breath sounds  ,  Abd: Positive bowel sounds, no rebound no guarding , Ext No edema  .  All available labs, radiology testing, previous records reviewed. Agree with documented assessment and plan. Chest pain:  No further cardiac work up.  Atrial fib:  Warfarin restarted.  Respiratory failure:  Change to PO Lasix in the AM.   Rollene RotundaJames Ryer Asato  12:49 PM  09/19/2013

## 2013-09-19 NOTE — Progress Notes (Signed)
Patient Demographics  Bruce Taylor, is a 73 y.o. male, DOB - 1941-05-26, WUJ:[id number]  Admit date - 09/17/2013   Admitting Physician Eduard Clos, MD  Outpatient Primary MD for the patient is  [name], MD  LOS - 2   Chief Complaint  Patient presents with  . Chest Pain        Assessment & Plan    1. Acute respiratory failure - Secondary to acute on chronic nonspecific CHF along with possible early community-acquired pneumonia -  Echo done here shows EF of 50-55% with insufficient evaluation for diastolic function. Most likely patient has acute on chronic diastolic CHF, responding well to IV diuretics, cardiology is following, he is clinically feeling better, continue to titrate down oxygen, continue Lasix, will also continue beta blocker, long-acting nitrate and Aldactone. Placed on fluid and salt restriction.   CAP - with possible infiltrate on CT scan of the chest, is developing mild leukocytosis and has a mild cough, remains afebrile, will place on Levaquin, repeat 2 view chest x-ray and monitor.    2. Chronic atrial fibrillation. Goal will be rate control, continue beta blocker, Coumadin to be dosed by pharmacy, on heparin bridge for now.    3.DM 2 - continue home regimen which consists of, long-acting insulin, Lingaliptin along with sliding scale. Monitor CBGs.   CBG (last 3)   Recent Labs  09/18/13 2332 09/19/13 0344 09/19/13 0753  GLUCAP 165* 119* 148*     4. Dyslipidemia. Continue home dose statin.    5. History of COPD. No acute issues. No wheezing on exam, supportive care with oxygen and nebulizer treatments as needed.    6. Falsely elevated point of care troponin. Subsequent lab troponins were negative, no EKG change, no NSTEMI.    7. Low  potassium. Replaced. Will monitor.      Code Status: Full  Family Communication: None present  Disposition Plan: To be decided   Procedures CT chest, abdomen pelvis, chest x-ray, echo   Consults Cards   Medications  Scheduled Meds: . allopurinol  300 mg Oral Daily  . aspirin EC  325 mg Oral Daily  . atorvastatin  80 mg Oral q1800  . budesonide  0.5 mg Nebulization BID  . citalopram  40 mg Oral Daily  . furosemide  40 mg Intravenous BID  . gabapentin  300 mg Oral Daily  . insulin aspart  0-9 Units Subcutaneous 6 times per day  . insulin aspart protamine- aspart  15 Units Subcutaneous BID WC  . ipratropium-albuterol  3 mL Nebulization Q6H  . isosorbide mononitrate  60 mg Oral Daily  . linagliptin  5 mg Oral Daily  . magnesium oxide  400 mg Oral Daily  . metoprolol tartrate  12.5 mg Oral BID  . pantoprazole  40 mg Oral Daily  . sodium chloride  3 mL Intravenous Q12H  . spironolactone  12.5 mg Oral BID  . warfarin  10 mg Oral ONCE-1800  . Warfarin - Pharmacist Dosing Inpatient   Does not apply q1800   Continuous Infusions: . heparin 1,300 Units/hr (09/19/13 0600)   PRN Meds:.acetaminophen, albuterol, nitroGLYCERIN, ondansetron (ZOFRAN) IV, oxybutynin  DVT Prophylaxis  Coumadin - Heparin   Lab Results  Component Value Date  PLT PLATELET CLUMPS NOTED ON SMEAR 09/19/2013    Antibiotics     Anti-infectives   None          Subjective:   Bruce Taylor today has, No headache, No chest pain, No abdominal pain - No Nausea, No new weakness tingling or numbness, No Cough - SOB.    Objective:   Filed Vitals:   09/19/13 0350 09/19/13 0749 09/19/13 0821 09/19/13 1005  BP: 97/62     Pulse: 55     Temp: 97.8 F (36.6 C) 97.4 F (36.3 C)    TempSrc:  Oral    Resp: 21     Height:      Weight:      SpO2: 93%  97% 93%    Wt Readings from Last 3 Encounters:  09/18/13 112 kg (246 lb 14.6 oz)  06/29/13 114.306 kg (252 lb)  06/13/13 116.574 kg (257 lb)       Intake/Output Summary (Last 24 hours) at 09/19/13 1049 Last data filed at 09/19/13 0800  Gross per 24 hour  Intake   1061 ml  Output   2350 ml  Net  -1289 ml     Physical Exam  Mildy sleepy but Oriented X 3, No new F.N deficits, Normal affect [place].AT,PERRAL Supple Neck,No JVD, No cervical lymphadenopathy appriciated.  Symmetrical Chest wall movement, Good air movement bilaterally, Coarse B sounds RRR,No Gallops,Rubs or new Murmurs, No Parasternal Heave +ve B.Sounds, Abd Soft, Non tender, No organomegaly appriciated, No rebound - guarding or rigidity. No Cyanosis, Clubbing or edema, No new Rash or bruise     Data Review   Micro Results Recent Results (from the past 240 hour(s))  MRSA PCR SCREENING     Status: None   Collection Time    09/18/13  6:31 AM      Result Value Ref Range Status   MRSA by PCR NEGATIVE  NEGATIVE Final   Comment:            The GeneXpert MRSA Assay (FDA     approved for NASAL specimens     only), is one component of a     comprehensive MRSA colonization     surveillance program. It is not     intended to diagnose MRSA     infection nor to guide or     monitor treatment for     MRSA infections.    Radiology Reports Ct Angio Chest W/cm &/or Wo Cm  09/18/2013   CLINICAL DATA:  73 year old male with chest pain, nausea, vomiting and abdominal/pelvic pain.  EXAM: CT ANGIOGRAPHY CHEST  CT ABDOMEN AND PELVIS WITH CONTRAST  TECHNIQUE: Multidetector CT imaging of the chest was performed using the standard protocol during bolus administration of intravenous contrast. Multiplanar CT image reconstructions and MIPs were obtained to evaluate the vascular anatomy. Multidetector CT imaging of the abdomen and pelvis was performed using the standard protocol during bolus administration of intravenous contrast.  CONTRAST:  OMNIPAQUE IOHEXOL 350 MG/ML SOLN  COMPARISON:  08/09/2013 and prior abdominal pelvic CTs.  FINDINGS: CTA CHEST FINDINGS  This is a  technically adequate study although respiratory motion artifact decreases sensitivity in several areas.  There is no evidence of pulmonary emboli. The central pulmonary arteries are enlarged compatible with pulmonary arterial hypertension.  Cardiomegaly and heavy coronary artery calcifications are present.  There is no evidence of thoracic aortic aneurysm.  A small left pleural effusion and miniscule right pleural effusion noted.  Mild to moderate bibasilar atelectasis  is present.  Mild central ground-glass opacities may represent pulmonary edema.  There is no evidence of consolidation or mass.  No acute or suspicious bony abnormalities are present.  Review of the MIP images confirms the above findings.  CT ABDOMEN and PELVIS FINDINGS  Cirrhosis identified without focal hepatic mass.  Cholelithiasis identified without CT evidence of cholecystitis.  Spleen, pancreas, adrenal glands are unremarkable.  Bilateral renal cortical thinning and renal cysts again noted.  There is no evidence of free fluid, enlarged lymph nodes, biliary dilation or abdominal aortic aneurysm.  The bowel and bladder are unremarkable.  No acute or suspicious bony abnormalities are identified. Degenerative changes with throughout the lumbar spine again noted.  IMPRESSION: No evidence of pulmonary emboli or aortic aneurysm.  Cardiomegaly with small bilateral pleural effusions, bibasilar atelectasis and question interstitial pulmonary edema.  Cirrhosis.  Cholelithiasis.  Coronary artery disease.   Electronically Signed   By: Laveda AbbeJeff  Hu M.D.   On: 09/18/2013 00:51   Ct Abdomen Pelvis W Contrast  09/18/2013   CLINICAL DATA:  73 year old male with chest pain, nausea, vomiting and abdominal/pelvic pain.  EXAM: CT ANGIOGRAPHY CHEST  CT ABDOMEN AND PELVIS WITH CONTRAST  TECHNIQUE: Multidetector CT imaging of the chest was performed using the standard protocol during bolus administration of intravenous contrast. Multiplanar CT image reconstructions and  MIPs were obtained to evaluate the vascular anatomy. Multidetector CT imaging of the abdomen and pelvis was performed using the standard protocol during bolus administration of intravenous contrast.  CONTRAST:  100mL OMNIPAQUE IOHEXOL 350 MG/ML SOLN  COMPARISON:  08/09/2013 and prior abdominal pelvic CTs.  FINDINGS: CTA CHEST FINDINGS  This is a technically adequate study although respiratory motion artifact decreases sensitivity in several areas.  There is no evidence of pulmonary emboli. The central pulmonary arteries are enlarged compatible with pulmonary arterial hypertension.  Cardiomegaly and heavy coronary artery calcifications are present.  There is no evidence of thoracic aortic aneurysm.  A small left pleural effusion and miniscule right pleural effusion noted.  Mild to moderate bibasilar atelectasis is present.  Mild central ground-glass opacities may represent pulmonary edema.  There is no evidence of consolidation or mass.  No acute or suspicious bony abnormalities are present.  Review of the MIP images confirms the above findings.  CT ABDOMEN and PELVIS FINDINGS  Cirrhosis identified without focal hepatic mass.  Cholelithiasis identified without CT evidence of cholecystitis.  Spleen, pancreas, adrenal glands are unremarkable.  Bilateral renal cortical thinning and renal cysts again noted.  There is no evidence of free fluid, enlarged lymph nodes, biliary dilation or abdominal aortic aneurysm.  The bowel and bladder are unremarkable.  No acute or suspicious bony abnormalities are identified. Degenerative changes with throughout the lumbar spine again noted.  IMPRESSION: No evidence of pulmonary emboli or aortic aneurysm.  Cardiomegaly with small bilateral pleural effusions, bibasilar atelectasis and question interstitial pulmonary edema.  Cirrhosis.  Cholelithiasis.  Coronary artery disease.   Electronically Signed   By: Laveda AbbeJeff  Hu M.D.   On: 09/18/2013 00:51      Dg Abd Acute W/chest  09/17/2013    CLINICAL DATA:  Abdominal and chest pain for 2 days.  EXAM: ACUTE ABDOMEN SERIES (ABDOMEN 2 VIEW & CHEST 1 VIEW)  COMPARISON:  08/21/2013  FINDINGS: Patient's left-sided transvenous pacemaker with leads to the right ventricle. The heart is enlarged. There is pulmonary vascular congestion without overt edema. No definite free intraperitoneal air. Bowel gas pattern is nonobstructive. There is mild gaseous distension of the stomach.  IMPRESSION: 1. Cardiomegaly.  Pulmonary vascular congestion. 2. Nonobstructive bowel gas pattern.   Electronically Signed   By: Rosalie Gums M.D.   On: 09/17/2013 21:33       CBC  Recent Labs Lab 09/17/13 2017 09/18/13 0710 09/19/13 0256  WBC 11.0* 8.1 14.3*  HGB 12.3* 12.8* 12.2*  HCT 37.6* 38.5* 36.5*  PLT 159 166 PLATELET CLUMPS NOTED ON SMEAR  MCV 91.7 91.9 91.3  MCH 30.0 30.5 30.5  MCHC 32.7 33.2 33.4  RDW 16.5* 16.8* 16.9*  LYMPHSABS  --  0.4*  --   MONOABS  --  0.1  --   EOSABS  --  0.0  --   BASOSABS  --  0.0  --     Chemistries   Recent Labs Lab 09/17/13 2017 09/18/13 0710 09/19/13 0256  NA 140 137 135*  K 4.3 4.4 3.6*  CL 103 100 96  CO2 21 19 21   GLUCOSE 133* 187* 120*  BUN 14 15 20   CREATININE 1.27 1.20 1.20  CALCIUM 9.1 9.0 9.0  MG 1.7  --   --   AST 20 19 17   ALT 10 10 8   ALKPHOS 95 93 82  BILITOT 0.5 0.5 0.4   ------------------------------------------------------------------------------------------------------------------ estimated creatinine clearance is 67.7 ml/min (by C-G formula based on Cr of 1.2). ------------------------------------------------------------------------------------------------------------------ No results found for this basename: HGBA1C,  in the last 72 hours ------------------------------------------------------------------------------------------------------------------ No results found for this basename: CHOL, HDL, LDLCALC, TRIG, CHOLHDL, LDLDIRECT,  in the last 72  hours ------------------------------------------------------------------------------------------------------------------  Recent Labs  09/18/13 0710  TSH 1.359   ------------------------------------------------------------------------------------------------------------------ No results found for this basename: VITAMINB12, FOLATE, FERRITIN, TIBC, IRON, RETICCTPCT,  in the last 72 hours  Coagulation profile  Recent Labs Lab 09/13/13 09/17/13 09/17/13 2017  INR 6.2 1.6 1.57*    No results found for this basename: DDIMER,  in the last 72 hours  Cardiac Enzymes  Recent Labs Lab 09/18/13 1247 09/18/13 1430 09/18/13 2041  TROPONINI <0.30 <0.30 <0.30   ------------------------------------------------------------------------------------------------------------------ No components found with this basename: POCBNP,      Time Spent in minutes  35   Ryan Palermo K M.D on 09/19/2013 at 10:49 AM  Between 7am to 7pm - Pager - [phone]  After 7pm go to [email or web address] - password [email or web address]  And look for the night coverage person covering for me after hours  [facility] Office  [phone]

## 2013-09-19 NOTE — Progress Notes (Signed)
Pt is refusing to wear CPAP tonight. No distress noted. RN aware. RT will monitor

## 2013-09-19 NOTE — Discharge Instructions (Addendum)
Follow with Primary MD  Duane Lopeoss, Alan, MD in 7 days   Get INR checked in 2 days at nursing facility.  Get CBC, CMP, checked 7 days by nursing facility.  Get a 2 view Chest X ray done in 1 week.   Activity: As tolerated with Full fall precautions use walker/cane & assistance as needed   Disposition SNF   Diet: Heart Healthy - low carbohydrate with strict 1.5 L free water restriction this includes ice , feeding assistance and aspiration precautions if needed.   Check your Weight same time everyday, if you gain over 2 pounds, or you develop in leg swelling, experience more shortness of breath or chest pain, call your Primary MD immediately. Follow Cardiac Low Salt Diet and 1.5 lit/day fluid restriction.   On your next visit with her primary care physician please Get Medicines reviewed and adjusted.  Please request your Prim.MD to go over all Hospital Tests and Procedure/Radiological results at the follow up, please get all Hospital records sent to your Prim MD by signing hospital release before you go home.   If you experience worsening of your admission symptoms, develop shortness of breath, life threatening emergency, suicidal or homicidal thoughts you must seek medical attention immediately by calling 911 or calling your MD immediately  if symptoms less severe.  You Must read complete instructions/literature along with all the possible adverse reactions/side effects for all the Medicines you take and that have been prescribed to you. Take any new Medicines after you have completely understood and accpet all the possible adverse reactions/side effects.   Do not drive and provide baby sitting services if your were admitted for syncope or siezures until you have seen by Primary MD or a Neurologist and advised to do so again.  Do not drive when taking Pain medications.    Do not take more than prescribed Pain, Sleep and Anxiety Medications  Special Instructions: If you have smoked or  chewed Tobacco  in the last 2 yrs please stop smoking, stop any regular Alcohol  and or any Recreational drug use.  Wear Seat belts while driving.   Please note  You were cared for by a hospitalist during your hospital stay. If you have any questions about your discharge medications or the care you received while you were in the hospital after you are discharged, you can call the unit and asked to speak with the hospitalist on call if the hospitalist that took care of you is not available. Once you are discharged, your primary care physician will handle any further medical issues. Please note that NO REFILLS for any discharge medications will be authorized once you are discharged, as it is imperative that you return to your primary care physician (or establish a relationship with a primary care physician if you do not have one) for your aftercare needs so that they can reassess your need for medications and monitor your lab values.      Information on my medicine - Coumadin   (Warfarin) Why was Coumadin prescribed for you? Coumadin was prescribed for you because you have a blood clot or a medical condition that can cause an increased risk of forming blood clots. Blood clots can cause serious health problems by blocking the flow of blood to the heart, lung, or brain. Coumadin can prevent harmful blood clots from forming. As a reminder your indication for Coumadin is:   Stroke Prevention Because Of Atrial Fibrillation  What test will check on my response to Coumadin?  While on Coumadin (warfarin) you will need to have an INR test regularly to ensure that your dose is keeping you in the desired range. The INR (international normalized ratio) number is calculated from the result of the laboratory test called prothrombin time (PT).  If an INR APPOINTMENT HAS NOT ALREADY BEEN MADE FOR YOU please schedule an appointment to have this lab work done by your health care provider within 7 days. Your INR  goal is usually a number between:  2 to 3. Ask your health care provider during an office visit what your goal INR is.  What  do you need to  know  About  COUMADIN? Take Coumadin (warfarin) exactly as prescribed by your healthcare provider about the same time each day.  DO NOT stop taking without talking to the doctor who prescribed the medication.  Stopping without other blood clot prevention medication to take the place of Coumadin may increase your risk of developing a new clot or stroke.  Get refills before you run out.  What do you do if you miss a dose? If you miss a dose, take it as soon as you remember on the same day then continue your regularly scheduled regimen the next day.  Do not take two doses of Coumadin at the same time.  Important Safety Information A possible side effect of Coumadin (Warfarin) is an increased risk of bleeding. You should call your healthcare provider right away if you experience any of the following:   Bleeding from an injury or your nose that does not stop.   Unusual colored urine (red or dark brown) or unusual colored stools (red or black).   Unusual bruising for unknown reasons.   A serious fall or if you hit your head (even if there is no bleeding).  Some foods or medicines interact with Coumadin (warfarin) and might alter your response to warfarin. To help avoid this:   Eat a balanced diet, maintaining a consistent amount of Vitamin K.   Notify your provider about major diet changes you plan to make.   Avoid alcohol or limit your intake to 1 drink for women and 2 drinks for men per day. (1 drink is 5 oz. wine, 12 oz. beer, or 1.5 oz. liquor.)  Make sure that ANY health care provider who prescribes medication for you knows that you are taking Coumadin (warfarin).  Also make sure the healthcare provider who is monitoring your Coumadin knows when you have started a new medication including herbals and non-prescription products.  Coumadin (Warfarin)   Major Drug Interactions  Increased Warfarin Effect Decreased Warfarin Effect  Alcohol (large quantities) Antibiotics (esp. Septra/Bactrim, Flagyl, Cipro) Amiodarone (Cordarone) Aspirin (ASA) Cimetidine (Tagamet) Megestrol (Megace) NSAIDs (ibuprofen, naproxen, etc.) Piroxicam (Feldene) Propafenone (Rythmol SR) Propranolol (Inderal) Isoniazid (INH) Posaconazole (Noxafil) Barbiturates (Phenobarbital) Carbamazepine (Tegretol) Chlordiazepoxide (Librium) Cholestyramine (Questran) Griseofulvin Oral Contraceptives Rifampin Sucralfate (Carafate) Vitamin K   Coumadin (Warfarin) Major Herbal Interactions  Increased Warfarin Effect Decreased Warfarin Effect  Garlic Ginseng Ginkgo biloba Coenzyme Q10 Green tea St. Johns wort    Coumadin (Warfarin) FOOD Interactions  Eat a consistent number of servings per week of foods HIGH in Vitamin K (1 serving =  cup)  Collards (cooked, or boiled & drained) Kale (cooked, or boiled & drained) Mustard greens (cooked, or boiled & drained) Parsley *serving size only =  cup Spinach (cooked, or boiled & drained) Swiss chard (cooked, or boiled & drained) Turnip greens (cooked, or boiled & drained)  Eat a consistent number  of servings per week of foods MEDIUM-HIGH in Vitamin K (1 serving = 1 cup)  Asparagus (cooked, or boiled & drained) Broccoli (cooked, boiled & drained, or raw & chopped) Brussel sprouts (cooked, or boiled & drained) *serving size only =  cup Lettuce, raw (green leaf, endive, romaine) Spinach, raw Turnip greens, raw & chopped   These websites have more information on Coumadin (warfarin):  FailFactory.se; VeganReport.com.au;

## 2013-09-19 NOTE — Progress Notes (Signed)
CSW attempted to assess for SNF. Patient not in room- taken for X-ray. CSW will attempt to assess at later time.   Samuella BruinKristin Tijuan Dantes, MSW, [facility] Rmh Medical CenterCSWA Clinical Social Worker [phone]

## 2013-09-20 LAB — CBC
HCT: 36.3 % — ABNORMAL LOW (ref 39.0–52.0)
HEMOGLOBIN: 12 g/dL — AB (ref 13.0–17.0)
MCH: 30.3 pg (ref 26.0–34.0)
MCHC: 33.1 g/dL (ref 30.0–36.0)
MCV: 91.7 fL (ref 78.0–100.0)
PLATELETS: 161 10*3/uL (ref 150–400)
RBC: 3.96 MIL/uL — ABNORMAL LOW (ref 4.22–5.81)
RDW: 17 % — AB (ref 11.5–15.5)
WBC: 12.5 10*3/uL — ABNORMAL HIGH (ref 4.0–10.5)

## 2013-09-20 LAB — GLUCOSE, CAPILLARY
GLUCOSE-CAPILLARY: 114 mg/dL — AB (ref 70–99)
GLUCOSE-CAPILLARY: 130 mg/dL — AB (ref 70–99)
Glucose-Capillary: 109 mg/dL — ABNORMAL HIGH (ref 70–99)
Glucose-Capillary: 114 mg/dL — ABNORMAL HIGH (ref 70–99)
Glucose-Capillary: 122 mg/dL — ABNORMAL HIGH (ref 70–99)
Glucose-Capillary: 155 mg/dL — ABNORMAL HIGH (ref 70–99)
Glucose-Capillary: 96 mg/dL (ref 70–99)

## 2013-09-20 LAB — BASIC METABOLIC PANEL
BUN: 20 mg/dL (ref 6–23)
CO2: 26 mEq/L (ref 19–32)
Calcium: 9.2 mg/dL (ref 8.4–10.5)
Chloride: 95 mEq/L — ABNORMAL LOW (ref 96–112)
Creatinine, Ser: 1.25 mg/dL (ref 0.50–1.35)
GFR calc non Af Amer: 55 mL/min — ABNORMAL LOW (ref >90)
GFR, EST AFRICAN AMERICAN: 64 mL/min — AB (ref >90)
Glucose, Bld: 103 mg/dL — ABNORMAL HIGH (ref 70–99)
Potassium: 4 mEq/L (ref 3.7–5.3)
Sodium: 134 mEq/L — ABNORMAL LOW (ref 137–147)

## 2013-09-20 LAB — HEPARIN LEVEL (UNFRACTIONATED): HEPARIN UNFRACTIONATED: 0.31 [IU]/mL (ref 0.30–0.70)

## 2013-09-20 LAB — PROTIME-INR
INR: 1.19 (ref 0.00–1.49)
PROTHROMBIN TIME: 14.8 s (ref 11.6–15.2)

## 2013-09-20 MED ORDER — WARFARIN SODIUM 6 MG PO TABS
12.0000 mg | ORAL_TABLET | Freq: Once | ORAL | Status: AC
  Administered 2013-09-20: 12 mg via ORAL
  Filled 2013-09-20: qty 2

## 2013-09-20 MED ORDER — CITALOPRAM HYDROBROMIDE 20 MG PO TABS
20.0000 mg | ORAL_TABLET | Freq: Every day | ORAL | Status: DC
  Administered 2013-09-21 – 2013-09-24 (×4): 20 mg via ORAL
  Filled 2013-09-20 (×4): qty 1

## 2013-09-20 MED ORDER — MAGNESIUM HYDROXIDE 400 MG/5ML PO SUSP: 15.0000 mL | Freq: Every day | ORAL | Status: DC | PRN

## 2013-09-20 MED ORDER — POLYETHYLENE GLYCOL 3350 17 G PO PACK
17.0000 g | PACK | Freq: Every day | ORAL | Status: DC
  Administered 2013-09-20 – 2013-09-22 (×3): 17 g via ORAL
  Filled 2013-09-20 (×3): qty 1

## 2013-09-20 NOTE — Progress Notes (Signed)
Subjective:  Breathing better  Objective:   Vital Signs in the last 24 hours: Temp:  [97.3 F (36.3 C)-98 F (36.7 C)] 97.4 F (36.3 C) (02/26 0751) Pulse Rate:  [55-63] 56 (02/26 0751) Resp:  [12-16] 12 (02/26 0751) BP: (92-134)/(46-68) 94/59 mmHg (02/26 0751) SpO2:  [90 %-96 %] 93 % (02/26 0917) Weight:  [235 lb 3.7 oz (106.7 kg)] 235 lb 3.7 oz (106.7 kg) (02/25 1135)  Intake/Output from previous day: 02/25 0701 - 02/26 0700 In: 702 [P.O.:240; I.V.:312; IV Piggyback:150] Out: 2900 [Urine:2900]  Medications: . allopurinol  300 mg Oral Daily  . aspirin EC  325 mg Oral Daily  . atorvastatin  80 mg Oral q1800  . budesonide  0.5 mg Nebulization BID  . citalopram  40 mg Oral Daily  . furosemide  40 mg Intravenous BID  . gabapentin  300 mg Oral Daily  . insulin aspart  0-9 Units Subcutaneous 6 times per day  . insulin aspart protamine- aspart  15 Units Subcutaneous BID WC  . ipratropium-albuterol  3 mL Nebulization Q6H  . isosorbide mononitrate  60 mg Oral Daily  . levofloxacin (LEVAQUIN) IV  750 mg Intravenous Daily  . linagliptin  5 mg Oral Daily  . magnesium oxide  400 mg Oral Daily  . metoprolol tartrate  12.5 mg Oral BID  . pantoprazole  40 mg Oral Daily  . sodium chloride  3 mL Intravenous Q12H  . spironolactone  12.5 mg Oral BID  . Warfarin - Pharmacist Dosing Inpatient   Does not apply q1800    . heparin 1,300 Units/hr (09/20/13 0751)    Physical Exam:   General appearance: alert, cooperative and no distress Neck: supple, symmetrical, trachea midline and thyroid not enlarged, symmetric, no tenderness/mass/nodules Lungs: slightly decreaed Bs at bases; no wheezing Heart: irregularly irregular rhythm Abdomen: soft, non-tender; bowel sounds normal; no masses,  no organomegaly Extremities: no edema, redness or tenderness in the calves or thighs Neurologic: tremor r hand   Rate: 68  Rhythm: atrial fibrillation  Lab Results:    Recent Labs   09/19/13 0256 09/20/13 0235  NA 135* 134*  K 3.6* 4.0  CL 96 95*  CO2 21 26  GLUCOSE 120* 103*  BUN 20 20  CREATININE 1.20 1.25    Recent Labs  09/18/13 1430 09/18/13 2041  TROPONINI <0.30 <0.30   Hepatic Function Panel  Recent Labs  09/19/13 0256  PROT 7.3  ALBUMIN 3.1*  AST 17  ALT 8  ALKPHOS 82  BILITOT 0.4    Recent Labs  09/20/13 0235  INR 1.19   BNP (last 3 results)  Recent Labs  04/03/13 1204 09/18/13 0217 09/18/13 0710  PROBNP 2587.0* 4460.0* 5013.0*    Lipid Panel     Component Value Date/Time   CHOL 131 06/16/2011 0720   TRIG 144 06/16/2011 0720   HDL 26* 06/16/2011 0720   CHOLHDL 5.0 06/16/2011 0720   VLDL 29 06/16/2011 0720   LDLCALC 76 06/16/2011 0720      Imaging:  Dg Chest 2 View  09/19/2013   CLINICAL DATA:  SOB  EXAM: CHEST  2 VIEW  COMPARISON:  DG CHEST 1V PORT dated 09/18/2013  FINDINGS: Lobe lung volumes. Cardiac silhouette is enlarged. A left chest wall cardiac pacing unit is appreciated with lead tip projecting in the region of the right ventricle. An area of increased density projects in the retrocardiac region appreciated primarily on the lateral view. There is mild prominence of the interstitial markings  and mild peribronchial cuffing. Osseous structures unremarkable.  IMPRESSION: Retrocardiac infiltrate versus atelectasis. Repeat PA and lateral view surveillance evaluation recommended status post appropriate therapeutic regimen. Findings which may reflect a component of pulmonary vascular congestion.   Electronically Signed   By: Salome HolmesHector  Cooper M.D.   On: 09/19/2013 14:26    ECHO: 09/18/2013  Study Conclusions - Left ventricle: The cavity size was normal. Wall thickness was normal. Systolic function was normal. The estimated ejection fraction was in the range of 50% to 55%. Wall motion was normal; there were no regional wall motion abnormalities. The study is not technically sufficient to allow evaluation of LV diastolic  function. - Ventricular septum: Septal motion showed paradox. These changes are consistent with right ventricular pacing. - Left atrium: The atrium was severely dilated. - Right ventricle: The cavity size was mildly dilated. Systolic function was moderately to severely reduced. - Right atrium: The atrium was moderately dilated. - Pulmonary arteries: Systolic pressure was mildly to moderately increased. PA peak pressure: 46mm Hg (S).      Assessment/Plan:   Principal Problem:   Acute respiratory failure Active Problems:   HYPERTENSION   COPD (chronic obstructive pulmonary disease) with emphysema   Unstable angina   DM (diabetes mellitus), type 2 with complications   Chronic a-fib  Breathing better. EF 50 - 55%, mild - mod Pulm HTN.  On levoquin.  INR 1.19 coumadin re-started 2/25; on heparin. Cr 1.25; I/O -2925 since admission. will re-assess BNP in am. For transfer out step down unit today,    Bruce Biharihomas A. Jonathandavid Marlett, MD, [facility] Site[facility]ACC 09/20/2013, 9:50 AM

## 2013-09-20 NOTE — Progress Notes (Signed)
Placed pt. On cpap. Pt. Is tolerating well at this time.  

## 2013-09-20 NOTE — Progress Notes (Addendum)
Bruce [facility] - Stepdown / ICU Progress Note  Bruce Taylor ZOX:[id number] DOB: 28-Apr-1941 DOA: 09/17/2013 PCP:  Bruce Lope, MD  Brief narrative: 73 yo patient with a history of CAD and previous stents, atrial fibrillation status post pacemaker implantation, sleep apnea, CHF as well as COPD on home oxygen at 3 L per minute. He presented to the emergency department with complaints of chest pain and shortness of breath that awakened him from sleeping. He also had some nausea and vomiting. The pain was primarily located in the left chest was retrosternal. In addition he been having diarrhea x2 episodes. No abdominal pain. In the emergency department a CT angiogram of the chest was completed that did not demonstrate PE but was concerning for CHF. The patient's troponin was mildly elevated and he was placed on a 50% mask. At the time of admission he was chest pain free. He was also evaluated by the cardiology team in the ER and was subsequently started on IV Lasix and heparin.  Assessment/Plan:    Acute on chronic hypoxemic respiratory failure:   A) COPD    B) Left-sided pneumonia   C) Mild CHF exacerbation   D) OSA   E) Moderate pulmonary hypertension -Now with productive brown cough -Continue antibiotics -Left chest pain consistent with pleuritic etiology -Continue supportive care -Oxygen has weaned to 4 L noting baseline at home 3 L -May have had transient heart failure from hypoxemia-resolved-consider discontinue or hold diuretics -On afterload reduction with Imdur    HYPERTENSION -Systolic blood pressure soft and in the 90s -Still on Lasix IV twice a day as well as Aldactone-may need to discontinue since in negative balance and it seems primary pulmonary issues related to pneumonia -Cautiously continue Imdur and metoprolol -Check orthostatic vital signs      ? Unstable angina/CAD -Enzymes have been negative -Chest pain seems more consistent with pleuritic -Continue  nitrate and beta blocker as blood pressure tolerates  Depression - decrease Celexa to 20 mg from 40 due to Qtc prolongation    DM (diabetes mellitus), type 2 with complications -CBGs controlled -Continue 70/30 insulin with Tradjenta and sliding scale insulin    Chronic atrial fibrillation/chronic anticoagulation -Rate controlled -Warfarin per pharmacy    Acute on chronic physical deconditioning -PT/OT evaluations -Patient unable to return to home environment and requests to return to [facility]    Obstipation -No BM since arrival -Begin MiraLax   DVT prophylaxis: Warfarin Code Status: Full Family Communication: No family at bedside Disposition Plan/Expected LOS: Transfer to telemetry   Consultants: Cardiology  Procedures: 2-D echocardiogram  Antibiotics: - Left ventricle: The cavity size was normal. Wall thickness was normal. Systolic function was normal. The estimated ejection fraction was in the range of 50% to 55%. Wall motion was normal; there were no regional wall motion abnormalities. The study is not technically sufficient to allow evaluation of LV diastolic function. - Ventricular septum: Septal motion showed paradox. These changes are consistent with right ventricular pacing. - Left atrium: The atrium was severely dilated. - Right ventricle: The cavity size was mildly dilated. Systolic function was moderately to severely reduced. - Right atrium: The atrium was moderately dilated. - Pulmonary arteries: Systolic pressure was mildly to moderately increased. PA peak pressure: 46mm Hg (S   HPI/Subjective: Patient alert. Endorses pleuritic type of chest pain with coughing and deep breathing.States is coughing up at this time brown thick sputum.   Endorses really hasn't walked well since his wife passed away last  September. Feels like he is too much care to remain at home with daughter tried to care for him in her young children. [facility]  Objective: Blood pressure 94/59, pulse 56, temperature 97.4 F (36.3 C), temperature source Oral, resp. rate 12, height 5\' 10"  (Taylor.778 m), weight 235 lb 3.7 oz (106.7 kg), SpO2 93.00%.  Intake/Output Summary (Last 24 hours) at 09/20/13 1112 Last data filed at 09/20/13 0751  Gross per 24 hour  Intake 726.05 ml  Output   3075 ml  Net -2348.95 ml     Exam: General: No acute respiratory distress at rest but does have wet sounding cough Lungs: Crackles bilaterally without wheezes, 4 L Cardiovascular: Regular ventricular paced rate and rhythm without murmur gallop or rub normal S1 and S2, no peripheral edema or JVD Abdomen: Nontender, nondistended, soft, bowel sounds positive, no rebound, no ascites, no appreciable mass Musculoskeletal: No significant cyanosis, clubbing of bilateral lower extremities Neurological: Alert and oriented x 3, moves all extremities x 4 without focal neurological deficits, CN 2-12 intact  Scheduled Meds:  Scheduled Meds: . allopurinol  300 mg Oral Daily  . aspirin EC  325 mg Oral Daily  . atorvastatin  80 mg Oral q1800  . budesonide  0.5 mg Nebulization BID  . citalopram  40 mg Oral Daily  . furosemide  40 mg Intravenous BID  . gabapentin  300 mg Oral Daily  . insulin aspart  0-9 Units Subcutaneous 6 times per day  . insulin aspart protamine- aspart  15 Units Subcutaneous BID WC  . ipratropium-albuterol  3 mL Nebulization Q6H  . isosorbide mononitrate  60 mg Oral Daily  . levofloxacin (LEVAQUIN) IV  750 mg Intravenous Daily  . linagliptin  5 mg Oral Daily  . magnesium oxide  400 mg Oral Daily  . metoprolol tartrate  12.5 mg Oral BID  . pantoprazole  40 mg Oral Daily  . polyethylene glycol  17 g Oral Daily  . sodium chloride  3 mL Intravenous Q12H  . spironolactone  12.5 mg Oral BID  . Warfarin - Pharmacist Dosing Inpatient   Does not apply q1800   Continuous Infusions: . heparin Taylor,300 Units/hr (09/20/13 0751)    Data  Reviewed: Basic Metabolic Panel:  Recent Labs Lab 09/17/13 2017 09/18/13 0710 09/19/13 0256 09/20/13 0235  NA 140 137 135* 134*  K 4.3 4.4 3.6* 4.0  CL 103 100 96 95*  CO2 21 19 21 26   GLUCOSE 133* 187* 120* 103*  BUN 14 15 20 20   CREATININE Taylor.27 Taylor.20 Taylor.20 Taylor.25  CALCIUM 9.Taylor 9.0 9.0 9.2  MG Taylor.7  --   --   --    Liver Function Tests:  Recent Labs Lab 09/17/13 2017 09/18/13 0710 09/19/13 0256  AST 20 19 17   ALT 10 10 8   ALKPHOS 95 93 82  BILITOT 0.5 0.5 0.4  PROT 7.8 7.7 7.3  ALBUMIN 3.3* 3.3* 3.Taylor*    Recent Labs Lab 09/17/13 2017  LIPASE 14   No results found for this basename: AMMONIA,  in the last 168 hours CBC:  Recent Labs Lab 09/17/13 2017 09/18/13 0710 09/19/13 0256 09/20/13 0235  WBC 11.0* 8.Taylor 14.3* 12.5*  NEUTROABS  --  7.6  --   --   HGB 12.3* 12.8* 12.2* 12.0*  HCT 37.6* 38.5* 36.5* 36.3*  MCV 91.7 91.9 91.3 91.7  PLT 159 166 PLATELET CLUMPS NOTED ON SMEAR 161   Cardiac Enzymes:  Recent  Labs Lab 09/18/13 0217 09/18/13 0710 09/18/13 1247 09/18/13 1430 09/18/13 2041  TROPONINI <0.30 <0.30 <0.30 <0.30 <0.30   BNP (last 3 results)  Recent Labs  04/03/13 1204 09/18/13 0217 09/18/13 0710  PROBNP 2587.0* 4460.0* 5013.0*   CBG:  Recent Labs Lab 09/19/13 1112 09/19/13 1501 09/19/13 1936 09/19/13 2346 09/20/13 0423  GLUCAP 184* 115* 130* 96 114*    Recent Results (from the past 240 hour(s))  MRSA PCR SCREENING     Status: None   Collection Time    09/18/13  6:31 AM      Result Value Ref Range Status   MRSA by PCR NEGATIVE  NEGATIVE Final   Comment:            The GeneXpert MRSA Assay (FDA     approved for NASAL specimens     only), is one component of a     comprehensive MRSA colonization     surveillance program. It is not     intended to diagnose MRSA     infection nor to guide or     monitor treatment for     MRSA infections.     Studies:  Recent x-ray studies have been reviewed in detail by the Attending  Physician  Time spent :     Junious Silk, ANP [facility] Office  [phone] Pager [phone]  **If unable to reach the above provider after paging please contact the Flow Manager @ [phone]  On-Call/Text Page:      [email or web address]      password [email or web address]  If 7PM-7AM, please contact night-coverage [email or web address] Password [email or web address] 09/20/2013, 11:12 AM   LOS: 3 days   I have examined the patient, reviewed the chart and modified the above note which I agree with.   Rickiya Picariello,MD [phone] 09/20/2013, 4:24 PM

## 2013-09-20 NOTE — Progress Notes (Signed)
Pt to TX to 2W-06, VSS, notified family.

## 2013-09-20 NOTE — Progress Notes (Signed)
ANTICOAGULATION CONSULT NOTE - Follow Up Consult  Pharmacy Consult for Heparin and Coumadin Indication: atrial fibrillation  Allergies  Allergen Reactions  . Penicillins Anaphylaxis and Swelling    "swell up all over my body; throat mainly, I think" (07/13/2012)  . Chlorhexidine Gluconate Itching    Chg cloth "don't remember how bad it was" (07/13/2012)    Patient Measurements: Height: 5\' 10"  (177.8 cm) Weight: 235 lb 3.7 oz (106.7 kg) IBW/kg (Calculated) : 73 Heparin Dosing Weight: 100 kg  Vital Signs: Temp: 97.4 F (36.3 C) (02/26 0751) Temp src: Oral (02/26 0751) BP: 101/59 mmHg (02/26 1200) Pulse Rate: 56 (02/26 0751)  Labs:  Recent Labs  09/17/13 2017  09/18/13 0710 09/18/13 1132 09/18/13 1247 09/18/13 1430 09/18/13 2041 09/19/13 0256 09/20/13 0235  HGB 12.3*  --  12.8*  --   --   --   --  12.2* 12.0*  HCT 37.6*  --  38.5*  --   --   --   --  36.5* 36.3*  PLT 159  --  166  --   --   --   --  PLATELET CLUMPS NOTED ON SMEAR 161  APTT 42*  --   --   --   --   --   --   --   --   LABPROT 18.3*  --   --   --   --   --   --   --  14.8  INR 1.57*  --   --   --   --   --   --   --  1.19  HEPARINUNFRC  --   --   --  0.53  --   --   --  0.41 0.31  CREATININE 1.27  --  1.20  --   --   --   --  1.20 1.25  TROPONINI  --   < > <0.30  --  <0.30 <0.30 <0.30  --   --   < > = values in this interval not displayed.  Estimated Creatinine Clearance: 63.4 ml/min (by C-G formula based on Cr of 1.25).  Assessment:   Heparin level is low therapeutic (0.31) today on 1300 units/hr.  INR 1.57 on admit 2/23, held for possible procedure. Coumadin resumed with 10 mg on 2/25.  Home regimen: 10 mg daily, excpet 12 mg on Mondays and Fridays. Outpatient INRs have been up and down. Aspirin added on admission; not on Aspirin at home. Patient reports tremor in right arm; previously only in left arm (for ~10-15 years per his report).  Primidone discontinued on 2/25.  Day # 2 Levaquin IV, which  sometimes effects INR.  Goal of Therapy:  INR 2-3 Heparin level 0.3-0.7 units/ml Monitor platelets by anticoagulation protocol: Yes   Plan:   Increase heparin drip to 1400 units/hr, to try to keep level at goal.  Increase today's Coumadin dose to 12 mg.  Continue daily heparin level, PT/INR and CBC.  Watch for Levaquin effect on PT/INR.   Consider decreasing Aspirin to 81 mg daily.  Also suggested to limit Celexa dose to 20 mg for patients >60 yrs of age, due to risk of QT-prolongation.  Has been on 40 mg daily.  Decrease dose?   Resume Primidone?  Dennie FettersEgan, Itzamar Traynor Donovan, RPh Pager: [phone] 09/20/2013,1:44 PM

## 2013-09-21 ENCOUNTER — Telehealth: Payer: Self-pay | Admitting: Cardiovascular Disease

## 2013-09-21 LAB — PROTIME-INR
INR: 1.34 (ref 0.00–1.49)
Prothrombin Time: 16.3 seconds — ABNORMAL HIGH (ref 11.6–15.2)

## 2013-09-21 LAB — GLUCOSE, CAPILLARY
GLUCOSE-CAPILLARY: 124 mg/dL — AB (ref 70–99)
Glucose-Capillary: 162 mg/dL — ABNORMAL HIGH (ref 70–99)
Glucose-Capillary: 171 mg/dL — ABNORMAL HIGH (ref 70–99)
Glucose-Capillary: 162 mg/dL — ABNORMAL HIGH (ref 70–99)

## 2013-09-21 LAB — BASIC METABOLIC PANEL
BUN: 20 mg/dL (ref 6–23)
CHLORIDE: 92 meq/L — AB (ref 96–112)
CO2: 28 meq/L (ref 19–32)
Calcium: 9.5 mg/dL (ref 8.4–10.5)
Creatinine, Ser: 1.35 mg/dL (ref 0.50–1.35)
GFR calc Af Amer: 58 mL/min — ABNORMAL LOW (ref >90)
GFR calc non Af Amer: 50 mL/min — ABNORMAL LOW (ref >90)
Glucose, Bld: 123 mg/dL — ABNORMAL HIGH (ref 70–99)
Potassium: 4.3 mEq/L (ref 3.7–5.3)
Sodium: 133 mEq/L — ABNORMAL LOW (ref 137–147)

## 2013-09-21 LAB — CBC
HCT: 37.6 % — ABNORMAL LOW (ref 39.0–52.0)
HEMOGLOBIN: 12.5 g/dL — AB (ref 13.0–17.0)
MCH: 30.6 pg (ref 26.0–34.0)
MCHC: 33.2 g/dL (ref 30.0–36.0)
MCV: 91.9 fL (ref 78.0–100.0)
Platelets: 171 10*3/uL (ref 150–400)
RBC: 4.09 MIL/uL — ABNORMAL LOW (ref 4.22–5.81)
RDW: 17.4 % — ABNORMAL HIGH (ref 11.5–15.5)
WBC: 9.5 10*3/uL (ref 4.0–10.5)

## 2013-09-21 LAB — PRO B NATRIURETIC PEPTIDE: PRO B NATRI PEPTIDE: 2249 pg/mL — AB (ref 0–125)

## 2013-09-21 LAB — HEPARIN LEVEL (UNFRACTIONATED): Heparin Unfractionated: 0.39 IU/mL (ref 0.30–0.70)

## 2013-09-21 MED ORDER — FUROSEMIDE 10 MG/ML IJ SOLN: 40.0000 mg | Freq: Every day | INTRAMUSCULAR | Status: DC

## 2013-09-21 MED ORDER — LEVOFLOXACIN 750 MG PO TABS
750.0000 mg | ORAL_TABLET | Freq: Every day | ORAL | Status: AC
  Administered 2013-09-21 – 2013-09-23 (×3): 750 mg via ORAL
  Filled 2013-09-21 (×4): qty 1

## 2013-09-21 MED ORDER — TRAMADOL HCL 50 MG PO TABS
50.0000 mg | ORAL_TABLET | Freq: Once | ORAL | Status: AC
  Administered 2013-09-21: 50 mg via ORAL
  Filled 2013-09-21: qty 1

## 2013-09-21 MED ORDER — FUROSEMIDE 40 MG PO TABS
40.0000 mg | ORAL_TABLET | Freq: Every day | ORAL | Status: DC
  Administered 2013-09-21: 40 mg via ORAL
  Filled 2013-09-21 (×2): qty 1

## 2013-09-21 MED ORDER — WARFARIN SODIUM 4 MG PO TABS
14.0000 mg | ORAL_TABLET | Freq: Once | ORAL | Status: AC
  Administered 2013-09-21: 14 mg via ORAL
  Filled 2013-09-21: qty 1

## 2013-09-21 NOTE — Progress Notes (Signed)
Received referral from inpatient RNCM for [facility]. Spoke with patient at bedside to explain services. However, he states he would rather his daughter be called. He does not know number. States he will have daughter Regulatory affairs officercall writer. Left brochure and contact information at bedside for patient's daughter to call to discuss [facility]. Made inpatient RNCM aware.  Raiford NobleAtika Deja Pisarski, MSN- RN- BSN- [facility] LPHN Care Management Hospital [phone]- [phone]

## 2013-09-21 NOTE — Progress Notes (Addendum)
ANTICOAGULATION CONSULT NOTE - Follow Up Consult  Pharmacy Consult for Heparin and Coumadin Indication: atrial fibrillation  Allergies  Allergen Reactions  . Penicillins Anaphylaxis and Swelling    "swell up all over my body; throat mainly, I think" (07/13/2012)  . Chlorhexidine Gluconate Itching    Chg cloth "don't remember how bad it was" (07/13/2012)    Patient Measurements: Height: 5\' 10"  (177.8 cm) Weight: 231 lb 0.7 oz (104.8 kg) IBW/kg (Calculated) : 73 Heparin Dosing Weight: 100 kg  Vital Signs: Temp: 97.4 F (36.3 C) (02/27 0502) Temp src: Oral (02/27 0502) BP: 94/56 mmHg (02/27 0502) Pulse Rate: 55 (02/27 0502)  Labs:  Recent Labs  09/18/13 1247 09/18/13 1430 09/18/13 2041  09/19/13 0256 09/20/13 0235 09/21/13 0452  HGB  --   --   --   < > 12.2* 12.0* 12.5*  HCT  --   --   --   --  36.5* 36.3* 37.6*  PLT  --   --   --   --  PLATELET CLUMPS NOTED ON SMEAR 161 171  LABPROT  --   --   --   --   --  14.8 16.3*  INR  --   --   --   --   --  1.19 1.34  HEPARINUNFRC  --   --   --   --  0.41 0.31 0.39  CREATININE  --   --   --   --  1.20 1.25 1.35  TROPONINI <0.30 <0.30 <0.30  --   --   --   --   < > = values in this interval not displayed.  Estimated Creatinine Clearance: 58.2 ml/min (by C-G formula based on Cr of 1.35).  Assessment: Heparin level is therapeutic (0.39) today on 1400 units/hr.  Warfarin held on admission for possible procedure, then resumed on 2/25.  Home regimen: 12 mg on Mondays and Fridays, 10 mg on all other days. Outpatient INRs historically fluctuate.  Aspirin added on admission; not on Aspirin at home, also is on levaquin which can effect INR.  Goal of Therapy:  INR 2-3 Heparin level 0.3-0.7 units/ml Monitor platelets by anticoagulation protocol: Yes   Plan:  -Continue heparin drip at 1400 units/hr -[place] 14 mg po x1 -Daily INR, HL, CBC -Consider decreasing dose of aspirin to 81 mg po daily  Agapito GamesAlison Ethanael Veith, PharmD,  BCPS Clinical Pharmacist Pager: [phone] 09/21/2013 9:08 AM

## 2013-09-21 NOTE — Progress Notes (Signed)
Occupational Therapy Evaluation Patient Details Name: Bruce Taylor MRN: [id number] DOB: 1941/03/15 Today's Date: 09/21/2013 Time: [id number] OT Time Calculation (min): 14 min  OT Assessment / Plan / Recommendation History of present illness Bruce Taylor is a 73 y.o. 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 Patient also had mild shortness of breath with nausea and vomiting.  In the ER patient had CT angiogram of the chest which showed features concerning for CHF.   Clinical Impression   Patient appears to be at baseline with ADLs, which is max-total A. Will sign off.    OT Assessment  All further OT needs can be met in the next venue of care    Follow Up Recommendations  SNF    Barriers to Discharge      Equipment Recommendations       Recommendations for Other Services    Frequency       Precautions / Restrictions Precautions Precautions: Fall Precaution Comments: monitor O2 sats Restrictions Weight Bearing Restrictions: No   Pertinent Vitals/Pain No c/o pain    ADL  Eating/Feeding: Performed;Maximal assistance Where Assessed - Eating/Feeding: Chair Grooming: Performed;Maximal assistance;Teeth care Where Assessed - Grooming: Supported sitting Upper Body Bathing: +1 Total assistance Lower Body Bathing: +1 Total assistance Upper Body Dressing: +1 Total assistance Lower Body Dressing: +1 Total assistance Transfers/Ambulation Related to ADLs: Deferred due to need for +2 A ADL Comments: Patient appears to be at baseline with ADLs, which was max-total A.    OT Diagnosis: Generalized weakness  OT Problem List: Decreased strength;Decreased activity tolerance;Impaired balance (sitting and/or standing);Decreased coordination OT Treatment Interventions:     OT Goals(Current goals can be found in  the care plan section) Acute Rehab OT Goals Patient Stated Goal: To get his mobility back  Visit Information  Last OT Received On: 09/21/13 Assistance Needed: +2 History of Present Illness: Bruce Taylor is a 73 y.o. 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 Patient also had mild shortness of breath with nausea and vomiting.  In the ER patient had CT angiogram of the chest which showed features concerning for CHF.       Prior [place] expects to be discharged to:: Skilled nursing facility Additional Comments: Patient requires max-total A with all ADLs at baseline.  Prior Function Level of Independence: Needs assistance Gait / Transfers Assistance Needed: Has aide 7a-6p who A with all ADLs including feeding. Patient has significant BUE tremors (R>L) that prevent him from being more independent. ADL's / Homemaking Assistance Needed: pt has been total (A) since September per pt  Communication Communication: No difficulties Dominant Hand: Right         Vision/Perception     Cognition  Cognition Arousal/Alertness: Awake/alert Behavior During Therapy: WFL for tasks assessed/performed Overall Cognitive Status: Within Functional Limits for tasks assessed Area of Impairment: Following commands;Safety/judgement;Problem solving;Orientation Orientation Level: Disoriented to;Time Memory: Decreased short-term memory Following Commands: Follows one step commands with increased time;Follows one step commands inconsistently Safety/Judgement: Decreased awareness of deficits;Decreased awareness of safety Problem Solving: Slow processing;Difficulty sequencing;Requires verbal cues;Requires tactile cues General Comments: pt more alert and aware of enviroment and situation. pt  was able to  state who and where he is. He also informed the therapist he was a fall risk and that two people would be needed to get him up. He does not recall working with therapy on eval. Pt was able to follow all cues and motivated today to participate with therapy.                       Extremity/Trunk Assessment Upper Extremity Assessment Upper Extremity Assessment: Generalized weakness (significant tremors BUE R>L) Lower Extremity Assessment Lower Extremity Assessment: Defer to PT evaluation     Mobility Bed Mobility Rolling: Min assist Sidelying to sit: Mod assist;HOB elevated General bed mobility comments: cues on technique and sequence needed with rolling and sitting up from sidelying to edge of bed. HOB elevated approx. 45 degrees to assist with sitting up . Transfers Overall transfer level: Needs assistance Equipment used: None Transfers: Sit to/from Stand Sit to Stand: Mod assist;+2 physical assistance;From elevated surface Stand pivot transfers: +2 physical assistance;Mod assist General transfer comment: cues on hand placement with transfers (to push to stand and use arms to control descent with sitting down). cues on ant weight shift to help with standing. once standing pt able to take 4-5 pivotal steps from bed to recliner chair.     Exercise     Balance Balance Overall balance assessment: Needs assistance Sitting-balance support: Feet supported;Single extremity supported Sitting balance-Leahy Scale: Fair Sitting balance - Comments: edge of bed for 8-10 minutes with supervision. cues on pursed lip breathing to get O2 sats up >90% Standing balance support: During functional activity;Bilateral upper extremity supported Standing balance-Leahy Scale: Zero   End of Session OT - End of Session Activity Tolerance: Patient tolerated treatment well Patient left: in chair;with call bell/phone within reach;with nursing/sitter in room Nurse Communication: Other (comment) (ADL status)  GO      Iverson Sees A 09/21/2013, 12:05 PM

## 2013-09-21 NOTE — Telephone Encounter (Signed)
Spoke with Mardella [facility]manLindsey at [facility] regarding outstanding orders needing signatures from Dr Tresa EndoKelly - requested that these forms be re-faxed so that we can give them to Dr Tresa EndoKelly to address today.

## 2013-09-21 NOTE — Care Management Note (Unsigned)
    Page 1 of 1   09/21/2013     2:58:09 PM   CARE MANAGEMENT NOTE 09/21/2013  Patient:  Bruce Taylor,Bruce Taylor   Account Number:  [id number]  Date Initiated:  09/18/2013  Documentation initiated by:  Donn PieriniWEBSTER,KRISTI  Subjective/Objective Assessment:   Pt admitted with acute resp. failure     Action/Plan:   PTA pt lived at home with daughter- NCM to follow for d/c needs   Anticipated DC Date:  09/21/2013   Anticipated DC Plan:  HOME W HOME HEALTH SERVICES  In-house referral  Clinical Social Worker      DC Planning Services  CM consult      Choice offered to / List presented to:             Status of service:  In process, will continue to follow Medicare Important Message given?   (If response is "NO", the following Medicare IM given date fields will be blank) Date Medicare IM given:   Date Additional Medicare IM given:    Discharge Disposition:    Per UR Regulation:  Reviewed for med. necessity/level of care/duration of stay  If discussed at Long Length of Stay Meetings, dates discussed:    Comments:  09/21/13 Kristiane Morsch,RN,BSN [phone] PT/OT RECOMMENDING SNF AT DC.  PER CSW, PT MAY BE UNABLE TO GO BACK TO SNF, AS HE HAS ONLY BEEN OUT OF FACILITY FOR 58 DAYS, AND NEEDS TO BE OUT 60 IN ORDER TO RETURN THERE.  PT WAS LIVING AT HOME WITH DAUGHTER AND HER CHILDREN PTA. SHOULD PT BE UNABLE TO DC TO SNF, WOULD RECOMMEND MAX HH FOLLOW UP (RN, PT, OT , AIDE AND CSW).  REFERRAL MADE TO [facility] FOR HOME FOLLOW UP, IF HOME DC OCCURS.  PT ACTIVE WITH [facility] PRIOR TO ADMISSION FOR HOME CARE NEEDS, AND [facility] FOR HOME OXYGEN NEEDS.

## 2013-09-21 NOTE — Progress Notes (Signed)
Patient Demographics  Bruce LemonWilliam Nightengale, is a 73 y.o. male, DOB - 01/25/1941, ZOX:[id number]  Admit date - 09/17/2013   Admitting Physician Eduard ClosArshad N Kakrakandy, MD  Outpatient Primary MD for the patient is  Duane Lopeoss, Alan, MD  LOS - 4   Chief Complaint  Patient presents with  . Chest Pain       Brief narrative:   73 yo patient with a history of CAD and previous stents, atrial fibrillation status post pacemaker implantation, sleep apnea, CHF as well as COPD on home oxygen at 3 L per minute. He presented to the emergency department with complaints of chest pain and shortness of breath that awakened him from sleeping. He also had some nausea and vomiting. The pain was primarily located in the left chest was retrosternal. In addition he been having diarrhea x2 episodes. No abdominal pain. In the emergency department a CT angiogram of the chest was completed that did not demonstrate PE but was concerning for CHF. The patient's troponin was mildly elevated and he was placed on a 50% mask. At the time of admission he was chest pain free. He was also evaluated by the cardiology team in the ER and was subsequently started on IV Lasix and heparin.     Assessment & Plan    1. Acute respiratory failure - Secondary to acute on chronic nonspecific CHF along with possible early CAP -  Echo done here shows EF of 50-55% with insufficient evaluation for diastolic function. Most likely patient has acute on chronic diastolic CHF, responded well to IV diuretics, cardiology is following, he is clinically feeling better, is back to 3 L nasal cannula oxygen which is his home dose oxygen, we'll lower Lasix dose to once a day as creatinine is top normal and blood pressure now on the lower side, developing mild hyponatremia and  edema almost completely resolved, continue beta blocker, long-acting nitrate and Aldactone. Placed on fluid and salt restriction.   CAP - with possible infiltrate on CT scan of the chest, is developing mild leukocytosis and has a mild cough, remains afebrile, will place on Levaquin, repeat 2 view chest x-ray in one week in the outpatient setting.     2. Chronic atrial fibrillation. Goal will be rate control, continue beta blocker, Coumadin to be dosed by pharmacy, on heparin bridge for now.  Lab Results  Component Value Date   INR 1.34 09/21/2013   INR 1.19 09/20/2013   INR 1.57* 09/17/2013     3.DM 2 - continue home regimen which consists of, long-acting insulin, Lingaliptin along with sliding scale. Monitor CBGs.   CBG (last 3)   Recent Labs  09/20/13 2018 09/20/13 2348 09/21/13 0501  GLUCAP 109* 114* 124*     4. Dyslipidemia. Continue home dose statin.    5. History of COPD. No acute issues. No wheezing on exam, supportive care with oxygen and nebulizer treatments as needed.    6. Falsely elevated point of care troponin. Subsequent lab troponins were negative, no EKG change, no NSTEMI.    7. Low potassium. Replaced.     8. History of depression. On Celexa.    9. Obstipation on bowel regimen.       Code Status: Full  Family Communication: None  present  Disposition Plan: To be decided   Procedures CT chest, abdomen pelvis, chest x-ray, echo   Consults Cards   Medications  Scheduled Meds: . allopurinol  300 mg Oral Daily  . aspirin EC  325 mg Oral Daily  . atorvastatin  80 mg Oral q1800  . budesonide  0.5 mg Nebulization BID  . citalopram  20 mg Oral Daily  . furosemide  40 mg Intravenous BID  . gabapentin  300 mg Oral Daily  . insulin aspart  0-9 Units Subcutaneous 6 times per day  . insulin aspart protamine- aspart  15 Units Subcutaneous BID WC  . ipratropium-albuterol  3 mL Nebulization Q6H  . isosorbide mononitrate  60 mg Oral  Daily  . levofloxacin  750 mg Oral Daily  . linagliptin  5 mg Oral Daily  . magnesium oxide  400 mg Oral Daily  . metoprolol tartrate  12.5 mg Oral BID  . pantoprazole  40 mg Oral Daily  . polyethylene glycol  17 g Oral Daily  . sodium chloride  3 mL Intravenous Q12H  . spironolactone  12.5 mg Oral BID  . Warfarin - Pharmacist Dosing Inpatient   Does not apply q1800   Continuous Infusions: . heparin 1,400 Units/hr (09/21/13 0037)   PRN Meds:.acetaminophen, albuterol, magnesium hydroxide, nitroGLYCERIN, ondansetron (ZOFRAN) IV, oxybutynin  DVT Prophylaxis  Coumadin - Heparin   Lab Results  Component Value Date   PLT 171 09/21/2013    Antibiotics     Anti-infectives   Start     Dose/Rate Route Frequency Ordered Stop   09/21/13 1000  levofloxacin (LEVAQUIN) tablet 750 mg     750 mg Oral Daily 09/21/13 0902     09/19/13 1200  levofloxacin (LEVAQUIN) IVPB 750 mg  Status:  Discontinued     750 mg 100 mL/hr over 90 Minutes Intravenous Daily 09/19/13 1058 09/21/13 0902          Subjective:   Bruce Taylor today has, No headache, No chest pain, No abdominal pain - No Nausea, No new weakness tingling or numbness, No Cough - SOB.    Objective:   Filed Vitals:   09/20/13 1954 09/20/13 2031 09/20/13 2118 09/21/13 0502  BP:  93/70  94/56  Pulse:  66 63 55  Temp:  97.7 F (36.5 C)  97.4 F (36.3 C)  TempSrc:  Oral  Oral  Resp:  20 17 16   Height:      Weight:    104.8 kg (231 lb 0.7 oz)  SpO2: 93% 90% 93% 95%    Wt Readings from Last 3 Encounters:  09/21/13 104.8 kg (231 lb 0.7 oz)  06/29/13 114.306 kg (252 lb)  06/13/13 116.574 kg (257 lb)     Intake/Output Summary (Last 24 hours) at 09/21/13 0923 Last data filed at 09/21/13 1610  Gross per 24 hour  Intake 670.95 ml  Output   2250 ml  Net -1579.05 ml     Physical Exam  Mildy sleepy but Oriented X 3, No new F.N deficits, Normal affect [place].AT,PERRAL Supple Neck,No JVD, No cervical lymphadenopathy  appriciated.  Symmetrical Chest wall movement, Good air movement bilaterally, few rales RRR,No Gallops,Rubs or new Murmurs, No Parasternal Heave +ve B.Sounds, Abd Soft, Non tender, No organomegaly appriciated, No rebound - guarding or rigidity. No Cyanosis, Clubbing , no edema, No new Rash or bruise     Data Review   Micro Results Recent Results (from the past 240 hour(s))  MRSA PCR SCREENING  Status: None   Collection Time    09/18/13  6:31 AM      Result Value Ref Range Status   MRSA by PCR NEGATIVE  NEGATIVE Final   Comment:            The GeneXpert MRSA Assay (FDA     approved for NASAL specimens     only), is one component of a     comprehensive MRSA colonization     surveillance program. It is not     intended to diagnose MRSA     infection nor to guide or     monitor treatment for     MRSA infections.    Radiology Reports Ct Angio Chest W/cm &/or Wo Cm  09/18/2013   CLINICAL DATA:  73 year old male with chest pain, nausea, vomiting and abdominal/pelvic pain.  EXAM: CT ANGIOGRAPHY CHEST  CT ABDOMEN AND PELVIS WITH CONTRAST  TECHNIQUE: Multidetector CT imaging of the chest was performed using the standard protocol during bolus administration of intravenous contrast. Multiplanar CT image reconstructions and MIPs were obtained to evaluate the vascular anatomy. Multidetector CT imaging of the abdomen and pelvis was performed using the standard protocol during bolus administration of intravenous contrast.  CONTRAST:  OMNIPAQUE IOHEXOL 350 MG/ML SOLN  COMPARISON:  08/09/2013 and prior abdominal pelvic CTs.  FINDINGS: CTA CHEST FINDINGS  This is a technically adequate study although respiratory motion artifact decreases sensitivity in several areas.  There is no evidence of pulmonary emboli. The central pulmonary arteries are enlarged compatible with pulmonary arterial hypertension.  Cardiomegaly and heavy coronary artery calcifications are present.  There is no evidence of  thoracic aortic aneurysm.  A small left pleural effusion and miniscule right pleural effusion noted.  Mild to moderate bibasilar atelectasis is present.  Mild central ground-glass opacities may represent pulmonary edema.  There is no evidence of consolidation or mass.  No acute or suspicious bony abnormalities are present.  Review of the MIP images confirms the above findings.  CT ABDOMEN and PELVIS FINDINGS  Cirrhosis identified without focal hepatic mass.  Cholelithiasis identified without CT evidence of cholecystitis.  Spleen, pancreas, adrenal glands are unremarkable.  Bilateral renal cortical thinning and renal cysts again noted.  There is no evidence of free fluid, enlarged lymph nodes, biliary dilation or abdominal aortic aneurysm.  The bowel and bladder are unremarkable.  No acute or suspicious bony abnormalities are identified. Degenerative changes with throughout the lumbar spine again noted.  IMPRESSION: No evidence of pulmonary emboli or aortic aneurysm.  Cardiomegaly with small bilateral pleural effusions, bibasilar atelectasis and question interstitial pulmonary edema.  Cirrhosis.  Cholelithiasis.  Coronary artery disease.   Electronically Signed   By: Laveda Abbe M.D.   On: 09/18/2013 00:51   Ct Abdomen Pelvis W Contrast  09/18/2013   CLINICAL DATA:  73 year old male with chest pain, nausea, vomiting and abdominal/pelvic pain.  EXAM: CT ANGIOGRAPHY CHEST  CT ABDOMEN AND PELVIS WITH CONTRAST  TECHNIQUE: Multidetector CT imaging of the chest was performed using the standard protocol during bolus administration of intravenous contrast. Multiplanar CT image reconstructions and MIPs were obtained to evaluate the vascular anatomy. Multidetector CT imaging of the abdomen and pelvis was performed using the standard protocol during bolus administration of intravenous contrast.  CONTRAST:  OMNIPAQUE IOHEXOL 350 MG/ML SOLN  COMPARISON:  08/09/2013 and prior abdominal pelvic CTs.  FINDINGS: CTA CHEST  FINDINGS  This is a technically adequate study although respiratory motion artifact decreases sensitivity in several areas.  There is no evidence of pulmonary emboli. The central pulmonary arteries are enlarged compatible with pulmonary arterial hypertension.  Cardiomegaly and heavy coronary artery calcifications are present.  There is no evidence of thoracic aortic aneurysm.  A small left pleural effusion and miniscule right pleural effusion noted.  Mild to moderate bibasilar atelectasis is present.  Mild central ground-glass opacities may represent pulmonary edema.  There is no evidence of consolidation or mass.  No acute or suspicious bony abnormalities are present.  Review of the MIP images confirms the above findings.  CT ABDOMEN and PELVIS FINDINGS  Cirrhosis identified without focal hepatic mass.  Cholelithiasis identified without CT evidence of cholecystitis.  Spleen, pancreas, adrenal glands are unremarkable.  Bilateral renal cortical thinning and renal cysts again noted.  There is no evidence of free fluid, enlarged lymph nodes, biliary dilation or abdominal aortic aneurysm.  The bowel and bladder are unremarkable.  No acute or suspicious bony abnormalities are identified. Degenerative changes with throughout the lumbar spine again noted.  IMPRESSION: No evidence of pulmonary emboli or aortic aneurysm.  Cardiomegaly with small bilateral pleural effusions, bibasilar atelectasis and question interstitial pulmonary edema.  Cirrhosis.  Cholelithiasis.  Coronary artery disease.   Electronically Signed   By: Laveda Abbe M.D.   On: 09/18/2013 00:51      Dg Abd Acute W/chest  09/17/2013   CLINICAL DATA:  Abdominal and chest pain for 2 days.  EXAM: ACUTE ABDOMEN SERIES (ABDOMEN 2 VIEW & CHEST 1 VIEW)  COMPARISON:  08/21/2013  FINDINGS: Patient's left-sided transvenous pacemaker with leads to the right ventricle. The heart is enlarged. There is pulmonary vascular congestion without overt edema. No definite free  intraperitoneal air. Bowel gas pattern is nonobstructive. There is mild gaseous distension of the stomach.  IMPRESSION: 1. Cardiomegaly.  Pulmonary vascular congestion. 2. Nonobstructive bowel gas pattern.   Electronically Signed   By: Rosalie Gums M.D.   On: 09/17/2013 21:33       CBC  Recent Labs Lab 09/17/13 2017 09/18/13 0710 09/19/13 0256 09/20/13 0235 09/21/13 0452  WBC 11.0* 8.1 14.3* 12.5* 9.5  HGB 12.3* 12.8* 12.2* 12.0* 12.5*  HCT 37.6* 38.5* 36.5* 36.3* 37.6*  PLT 159 166 PLATELET CLUMPS NOTED ON SMEAR 161 171  MCV 91.7 91.9 91.3 91.7 91.9  MCH 30.0 30.5 30.5 30.3 30.6  MCHC 32.7 33.2 33.4 33.1 33.2  RDW 16.5* 16.8* 16.9* 17.0* 17.4*  LYMPHSABS  --  0.4*  --   --   --   MONOABS  --  0.1  --   --   --   EOSABS  --  0.0  --   --   --   BASOSABS  --  0.0  --   --   --     Chemistries   Recent Labs Lab 09/17/13 2017 09/18/13 0710 09/19/13 0256 09/20/13 0235 09/21/13 0452  NA 140 137 135* 134* 133*  K 4.3 4.4 3.6* 4.0 4.3  CL 103 100 96 95* 92*  CO2 21 19 21 26 28   GLUCOSE 133* 187* 120* 103* 123*  BUN 14 15 20 20 20   CREATININE 1.27 1.20 1.20 1.25 1.35  CALCIUM 9.1 9.0 9.0 9.2 9.5  MG 1.7  --   --   --   --   AST 20 19 17   --   --   ALT 10 10 8   --   --   ALKPHOS 95 93 82  --   --   BILITOT 0.5 0.5  0.4  --   --    ------------------------------------------------------------------------------------------------------------------ estimated creatinine clearance is 58.2 ml/min (by C-G formula based on Cr of 1.35). ------------------------------------------------------------------------------------------------------------------ No results found for this basename: HGBA1C,  in the last 72 hours ------------------------------------------------------------------------------------------------------------------ No results found for this basename: CHOL, HDL, LDLCALC, TRIG, CHOLHDL, LDLDIRECT,  in the last 72  hours ------------------------------------------------------------------------------------------------------------------ No results found for this basename: TSH, T4TOTAL, FREET3, T3FREE, THYROIDAB,  in the last 72 hours ------------------------------------------------------------------------------------------------------------------ No results found for this basename: VITAMINB12, FOLATE, FERRITIN, TIBC, IRON, RETICCTPCT,  in the last 72 hours  Coagulation profile  Recent Labs Lab 09/17/13 09/17/13 2017 09/20/13 0235 09/21/13 0452  INR 1.6 1.57* 1.19 1.34    No results found for this basename: DDIMER,  in the last 72 hours  Cardiac Enzymes  Recent Labs Lab 09/18/13 1247 09/18/13 1430 09/18/13 2041  TROPONINI <0.30 <0.30 <0.30   ------------------------------------------------------------------------------------------------------------------ No components found with this basename: POCBNP,      Time Spent in minutes  35   SINGH,PRASHANT K M.D on 09/21/2013 at 9:23 AM  Between 7am to 7pm - Pager - [phone]  After 7pm go to [email or web address] - password [email or web address]  And look for the night coverage person covering for me after hours  [facility] Office  [phone]

## 2013-09-21 NOTE — Progress Notes (Signed)
SUBJECTIVE:  No further chest pain.  No SOB.  He has a wound on his right index finger.   PHYSICAL EXAM Filed Vitals:   09/20/13 2031 09/20/13 2118 09/21/13 0502 09/21/13 1008  BP: 93/70  94/56   Pulse: 66 63 55   Temp: 97.7 F (36.5 C)  97.4 F (36.3 C)   TempSrc: Oral  Oral   Resp: 20 17 16    Height:      Weight:   231 lb 0.7 oz (104.8 kg)   SpO2: 90% 93% 95% 92%   General:  No distress Lungs:  Clear Heart:  RRR Abdomen:  Positive bowel sounds, no rebound no guarding Extremities:  No edema, small wound draining puss on the right index finger.    LABS:  Results for orders placed during the hospital encounter of 09/17/13 (from the past 24 hour(s))  GLUCOSE, CAPILLARY     Status: Abnormal   Collection Time    09/20/13  1:26 PM      Result Value Ref Range   Glucose-Capillary 155 (*) 70 - 99 mg/dL  GLUCOSE, CAPILLARY     Status: Abnormal   Collection Time    09/20/13  4:30 PM      Result Value Ref Range   Glucose-Capillary 122 (*) 70 - 99 mg/dL  GLUCOSE, CAPILLARY     Status: Abnormal   Collection Time    09/20/13  8:18 PM      Result Value Ref Range   Glucose-Capillary 109 (*) 70 - 99 mg/dL  GLUCOSE, CAPILLARY     Status: Abnormal   Collection Time    09/20/13 11:48 PM      Result Value Ref Range   Glucose-Capillary 114 (*) 70 - 99 mg/dL  HEPARIN LEVEL (UNFRACTIONATED)     Status: None   Collection Time    09/21/13  4:52 AM      Result Value Ref Range   Heparin Unfractionated 0.39  0.30 - 0.70 IU/mL  CBC     Status: Abnormal   Collection Time    09/21/13  4:52 AM      Result Value Ref Range   WBC 9.5  4.0 - 10.5 K/uL   RBC 4.09 (*) 4.22 - 5.81 MIL/uL   Hemoglobin 12.5 (*) 13.0 - 17.0 g/dL   HCT 16.137.6 (*) 09.639.0 - 04.552.0 %   MCV 91.9  78.0 - 100.0 fL   MCH 30.6  26.0 - 34.0 pg   MCHC 33.2  30.0 - 36.0 g/dL   RDW 40.917.4 (*) 81.111.5 - 91.415.5 %   Platelets 171  150 - 400 K/uL  PROTIME-INR     Status: Abnormal   Collection Time    09/21/13  4:52 AM      Result Value  Ref Range   Prothrombin Time 16.3 (*) 11.6 - 15.2 seconds   INR 1.34  0.00 - 1.49  BASIC METABOLIC PANEL     Status: Abnormal   Collection Time    09/21/13  4:52 AM      Result Value Ref Range   Sodium 133 (*) 137 - 147 mEq/L   Potassium 4.3  3.7 - 5.3 mEq/L   Chloride 92 (*) 96 - 112 mEq/L   CO2 28  19 - 32 mEq/L   Glucose, Bld 123 (*) 70 - 99 mg/dL   BUN 20  6 - 23 mg/dL   Creatinine, Ser 7.821.35  0.50 - 1.35 mg/dL   Calcium 9.5  8.4 - 95.610.5 mg/dL  GFR calc non Af Amer 50 (*) >90 mL/min   GFR calc Af Amer 58 (*) >90 mL/min  PRO B NATRIURETIC PEPTIDE     Status: Abnormal   Collection Time    09/21/13  4:57 AM      Result Value Ref Range   Pro B Natriuretic peptide (BNP) 2249.0 (*) 0 - 125 pg/mL  GLUCOSE, CAPILLARY     Status: Abnormal   Collection Time    09/21/13  5:01 AM      Result Value Ref Range   Glucose-Capillary 124 (*) 70 - 99 mg/dL    Intake/Output Summary (Last 24 hours) at 09/21/13 1045 Last data filed at 09/21/13 1610  Gross per 24 hour  Intake 670.95 ml  Output   2250 ml  Net -1579.05 ml    ASSESSMENT AND PLAN:  CHEST PAIN:    Troponin x 3 negative.  There are other possible etiologies (esophageal).  No cath planned.    ACUTE ON CHRONIC SYSTOLIC AND DIASTOLIC:  Despite elevated proBNP I think that he can go to his previous PO diuretic dose.    HYPERTENSION:   BP OK.  No change in therapy.    CHRONIC ATRIAL FIB:   If no further invasive procedures planned per primary team.  Warfarin will be restarted.  We will sign off.  Please call with further questions.    Fayrene Fearing [facility] 09/21/2013 10:45 AM

## 2013-09-21 NOTE — Progress Notes (Signed)
Physical Therapy Treatment Patient Details Name: Bruce Taylor MRN: [id number] DOB: 09/29/1940 Today's Date: 09/21/2013 Time: 4098-1191 PT Time Calculation (min): 35 min  PT Assessment / Plan / Recommendation  History of Present Illness Bruce Taylor is a 73 y.o. 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 Patient also had mild shortness of breath with nausea and vomiting.  In the ER patient had CT angiogram of the chest which showed features concerning for CHF.   PT Comments   Pt making steady progress toward his goals. Continue to recommend SNF for further rehab after acute to allow pt to return to prior level of physical function.   Follow Up Recommendations  SNF;Supervision/Assistance - 24 hour     Equipment Recommendations  None recommended by PT    Frequency Min 2X/week   Progress towards PT Goals Progress towards PT goals: Progressing toward goals  Plan Current plan remains appropriate    Precautions / Restrictions Precautions Precautions: Fall Precaution Comments: monitor O2 sats Restrictions Weight Bearing Restrictions: No    Mobility  Bed Mobility Rolling: Min assist Sidelying to sit: Mod assist;HOB elevated General bed mobility comments: cues on technique and sequence needed with rolling and sitting up from sidelying to edge of bed. HOB elevated approx. 45 degrees to assist with sitting up . Transfers Overall transfer level: Needs assistance Equipment used: None Transfers: Sit to/from Stand Sit to Stand: Mod assist;+2 physical assistance;From elevated surface Stand pivot transfers: +2 physical assistance;Mod assist General transfer comment: cues on hand placement with transfers (to push to stand and use arms to control descent with sitting down). cues on ant weight shift to help with  standing. once standing pt able to take 4-5 pivotal steps from bed to recliner chair.      PT Goals (current goals can now be found in the care plan section) Acute Rehab PT Goals Patient Stated Goal: To get his mobility back PT Goal Formulation: With patient Time For Goal Achievement: 10/03/13 Potential to Achieve Goals: Fair  Visit Information  Last PT Received On: 09/21/13 Assistance Needed: +2 History of Present Illness: Bruce Taylor is a 73 y.o. 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 Patient also had mild shortness of breath with nausea and vomiting.  In the ER patient had CT angiogram of the chest which showed features concerning for CHF.    Subjective Data  Patient Stated Goal: To get his mobility back   Cognition  Cognition Arousal/Alertness: Awake/alert Behavior During Therapy: WFL for tasks assessed/performed Overall Cognitive Status: Within Functional Limits for tasks assessed General Comments: pt more alert and aware of enviroment and situation. pt was able to state who and where he is. He also informed the therapist he was a fall risk and that two people would be needed to get him up. He does not recall working with therapy on eval. Pt was able to follow all cues and motivated today to participate with therapy.                       Balance  Balance Overall balance assessment: Needs assistance Sitting-balance support: Feet supported;Single extremity supported Sitting balance-Leahy Scale: Fair  Sitting balance - Comments: edge of bed for 8-10 minutes with supervision. cues on pursed lip breathing to get O2 sats up >90% Standing balance support: During functional activity;Bilateral upper extremity supported Standing balance-Leahy Scale: Zero  End of Session PT - End of Session Equipment Utilized  During Treatment: Gait belt;Oxygen Activity Tolerance: Patient tolerated treatment well;Patient limited by fatigue Patient left: in chair;with call bell/phone within reach Nurse Communication: Mobility status;Need for lift equipment   GP     Sallyanne KusterBury, Arihana Ambrocio 09/21/2013, 9:37 AM

## 2013-09-22 ENCOUNTER — Inpatient Hospital Stay (HOSPITAL_COMMUNITY): Payer: Medicare Other

## 2013-09-22 LAB — GLUCOSE, CAPILLARY
GLUCOSE-CAPILLARY: 113 mg/dL — AB (ref 70–99)
GLUCOSE-CAPILLARY: 122 mg/dL — AB (ref 70–99)
GLUCOSE-CAPILLARY: 172 mg/dL — AB (ref 70–99)
Glucose-Capillary: 152 mg/dL — ABNORMAL HIGH (ref 70–99)
Glucose-Capillary: 123 mg/dL — ABNORMAL HIGH (ref 70–99)
Glucose-Capillary: 161 mg/dL — ABNORMAL HIGH (ref 70–99)

## 2013-09-22 LAB — BASIC METABOLIC PANEL
BUN: 23 mg/dL (ref 6–23)
CALCIUM: 9.4 mg/dL (ref 8.4–10.5)
CO2: 27 meq/L (ref 19–32)
Chloride: 93 mEq/L — ABNORMAL LOW (ref 96–112)
Creatinine, Ser: 1.41 mg/dL — ABNORMAL HIGH (ref 0.50–1.35)
GFR calc Af Amer: 55 mL/min — ABNORMAL LOW (ref >90)
GFR calc non Af Amer: 48 mL/min — ABNORMAL LOW (ref >90)
GLUCOSE: 137 mg/dL — AB (ref 70–99)
POTASSIUM: 3.8 meq/L (ref 3.7–5.3)
Sodium: 137 mEq/L (ref 137–147)

## 2013-09-22 LAB — HEPARIN LEVEL (UNFRACTIONATED): Heparin Unfractionated: 0.54 IU/mL (ref 0.30–0.70)

## 2013-09-22 LAB — PROTIME-INR
INR: 1.52 — AB (ref 0.00–1.49)
Prothrombin Time: 17.9 seconds — ABNORMAL HIGH (ref 11.6–15.2)

## 2013-09-22 MED ORDER — FUROSEMIDE 40 MG PO TABS
40.0000 mg | ORAL_TABLET | Freq: Every day | ORAL | Status: DC
  Administered 2013-09-22: 40 mg via ORAL
  Filled 2013-09-22 (×2): qty 1

## 2013-09-22 MED ORDER — BISACODYL 10 MG RE SUPP
10.0000 mg | Freq: Every day | RECTAL | Status: DC
  Administered 2013-09-22: 10 mg via RECTAL
  Filled 2013-09-22: qty 1

## 2013-09-22 MED ORDER — WARFARIN SODIUM 6 MG PO TABS
12.0000 mg | ORAL_TABLET | Freq: Once | ORAL | Status: AC
  Administered 2013-09-22: 12 mg via ORAL
  Filled 2013-09-22: qty 2

## 2013-09-22 MED ORDER — POLYETHYLENE GLYCOL 3350 17 G PO PACK
17.0000 g | PACK | Freq: Two times a day (BID) | ORAL | Status: DC
  Administered 2013-09-23: 17 g via ORAL
  Filled 2013-09-22 (×5): qty 1

## 2013-09-22 MED ORDER — INSULIN ASPART 100 UNIT/ML ~~LOC~~ SOLN
0.0000 [IU] | Freq: Three times a day (TID) | SUBCUTANEOUS | Status: DC
  Administered 2013-09-22: 1 [IU] via SUBCUTANEOUS
  Administered 2013-09-22: 2 [IU] via SUBCUTANEOUS
  Administered 2013-09-23 (×3): 1 [IU] via SUBCUTANEOUS
  Administered 2013-09-24: 07:00:00 via SUBCUTANEOUS
  Administered 2013-09-24 (×2): 2 [IU] via SUBCUTANEOUS

## 2013-09-22 MED ORDER — HYDRALAZINE HCL 20 MG/ML IJ SOLN: 10.0000 mg | Freq: Four times a day (QID) | INTRAMUSCULAR | Status: DC | PRN

## 2013-09-22 NOTE — Progress Notes (Signed)
ANTICOAGULATION CONSULT NOTE - Follow Up Consult  Pharmacy Consult for Heparin and Coumadin Indication: atrial fibrillation  Allergies  Allergen Reactions  . Penicillins Anaphylaxis and Swelling    "swell up all over my body; throat mainly, I think" (07/13/2012)  . Chlorhexidine Gluconate Itching    Chg cloth "don't remember how bad it was" (07/13/2012)    Patient Measurements: Height: 5\' 10"  (177.8 cm) Weight: 231 lb 0.7 oz (104.8 kg) IBW/kg (Calculated) : 73 Heparin Dosing Weight: ~100kg  Vital Signs: Temp: 97.7 F (36.5 C) (02/28 0426) Temp src: Oral (02/28 0426) BP: 133/102 mmHg (02/28 0426) Pulse Rate: 53 (02/28 0426)  Labs:  Recent Labs  09/20/13 0235 09/21/13 0452 09/22/13 0326  HGB 12.0* 12.5*  --   HCT 36.3* 37.6*  --   PLT 161 171  --   LABPROT 14.8 16.3* 17.9*  INR 1.19 1.34 1.52*  HEPARINUNFRC 0.31 0.39 0.54  CREATININE 1.25 1.35 1.41*    Estimated Creatinine Clearance: 55.7 ml/min (by C-G formula based on Cr of 1.41).   Medications:  Heparin @ 1400 units/hr  Assessment: 72yom continues on heparin to coumadin bridge for afib. Heparin level is at goal. INR is below goal but trending up. Also continues on day #4 levaquin which can increase INR so don't want to be too aggressive with coumadin dosing. No bleeding reported.  Goal of Therapy:  INR 2-3 Heparin level 0.3-0.7 units/ml Monitor platelets by anticoagulation protocol: Yes   Plan:  1) Continue heparin at 1400 units/hr 2) Coumadin 12mg  x 1 tonight 3) Heparin level, INR, CBC in AM  Fredrik RiggerMarkle, Henery Betzold Sue 09/22/2013,10:29 AM

## 2013-09-22 NOTE — Progress Notes (Signed)
Patient Demographics  Bruce Taylor, is a 73 y.o. male, DOB - [birth year], ZOX:[id number]  Admit date - 09/17/2013   Admitting Physician Eduard Clos, MD  Outpatient Primary MD for the patient is  [name], MD  LOS - 5   Chief Complaint  Patient presents with  . Chest Pain       Brief narrative:   73 yo patient with a history of CAD and previous stents, atrial fibrillation status post pacemaker implantation, sleep apnea, CHF as well as COPD on home oxygen at 3 L per minute. He presented to the emergency department with complaints of chest pain and shortness of breath that awakened him from sleeping. He also had some nausea and vomiting. The pain was primarily located in the left chest was retrosternal. In addition he been having diarrhea x2 episodes. No abdominal pain. In the emergency department a CT angiogram of the chest was completed that did not demonstrate PE but was concerning for CHF. The patient's troponin was mildly elevated and he was placed on a 50% mask. At the time of admission he was chest pain free. He was also evaluated by the cardiology team in the ER and was subsequently started on IV Lasix and heparin.     Assessment & Plan    1. Acute respiratory failure - Secondary to acute on chronic nonspecific CHF along with possible early CAP -  Echo done here shows EF of 50-55% with insufficient evaluation for diastolic function. Most likely patient has acute on chronic diastolic CHF, responded well to IV diuretics, cardiology is following, he is clinically feeling better, is back to 3 L nasal cannula oxygen which is his home dose oxygen, hold Lasix dose as now has ARF, developing mild hyponatremia and edema completely resolved, continue beta blocker, long-acting nitrate . Placed  on fluid and salt restriction.   CAP - with possible infiltrate on CT scan of the chest, is developing mild leukocytosis and has a mild cough, remains afebrile, will place on Levaquin, will need repeat 2 view chest x-ray in one week in the outpatient setting.     2. Chronic atrial fibrillation. Goal will be rate control, continue beta blocker, Coumadin to be dosed by pharmacy, on heparin bridge for now.  Lab Results  Component Value Date   INR 1.52* 09/22/2013   INR 1.34 09/21/2013   INR 1.19 09/20/2013     3.DM 2 - continue home regimen which consists of, long-acting insulin, Lingaliptin along with sliding scale. Monitor CBGs.   CBG (last 3)   Recent Labs  09/21/13 2020 09/21/13 2356 09/22/13 0418  GLUCAP 172* 122* 152*     4. Dyslipidemia. Continue home dose statin.    5. History of COPD. No acute issues. No wheezing on exam, supportive care with oxygen and nebulizer treatments as needed.    6. Falsely elevated point of care troponin. Subsequent lab troponins were negative, no EKG change, no NSTEMI.    7. Low potassium. Replaced.     8. History of depression. On Celexa.    9. Obstipation on bowel regimen.    10.ARF - hold diuresis, repeat BMP in the morning.      Code Status: Full  Family Communication: None present  Disposition  Plan: To be decided   Procedures CT chest, abdomen pelvis, chest x-ray, echo   Consults Cards   Medications  Scheduled Meds: . allopurinol  300 mg Oral Daily  . aspirin EC  325 mg Oral Daily  . atorvastatin  80 mg Oral q1800  . budesonide  0.5 mg Nebulization BID  . citalopram  20 mg Oral Daily  . furosemide  40 mg Oral Daily  . gabapentin  300 mg Oral Daily  . insulin aspart  0-9 Units Subcutaneous TID WC  . insulin aspart protamine- aspart  15 Units Subcutaneous BID WC  . ipratropium-albuterol  3 mL Nebulization Q6H  . isosorbide mononitrate  60 mg Oral Daily  . levofloxacin  750 mg Oral Daily  .  linagliptin  5 mg Oral Daily  . magnesium oxide  400 mg Oral Daily  . metoprolol tartrate  12.5 mg Oral BID  . pantoprazole  40 mg Oral Daily  . polyethylene glycol  17 g Oral Daily  . sodium chloride  3 mL Intravenous Q12H  . spironolactone  12.5 mg Oral BID  . warfarin  12 mg Oral ONCE-1800  . Warfarin - Pharmacist Dosing Inpatient   Does not apply q1800   Continuous Infusions: . heparin 1,400 Units/hr (09/21/13 2019)   PRN Meds:.acetaminophen, albuterol, hydrALAZINE, magnesium hydroxide, nitroGLYCERIN, ondansetron (ZOFRAN) IV, oxybutynin  DVT Prophylaxis  Coumadin - Heparin   Lab Results  Component Value Date   PLT 171 09/21/2013    Antibiotics     Anti-infectives   Start     Dose/Rate Route Frequency Ordered Stop   09/21/13 1000  levofloxacin (LEVAQUIN) tablet 750 mg     750 mg Oral Daily 09/21/13 0902     09/19/13 1200  levofloxacin (LEVAQUIN) IVPB 750 mg  Status:  Discontinued     750 mg 100 mL/hr over 90 Minutes Intravenous Daily 09/19/13 1058 09/21/13 0902          Subjective:   Bruce Taylor today has, No headache, No chest pain, No abdominal pain - No Nausea, No new weakness tingling or numbness, No Cough - SOB.    Objective:   Filed Vitals:   09/21/13 2052 09/21/13 2143 09/22/13 0426 09/22/13 0814  BP:   133/102   Pulse:  58 53   Temp:   97.7 F (36.5 C)   TempSrc:   Oral   Resp:  18 18   Height:      Weight:      SpO2: 94% 94% 92% 90%    Wt Readings from Last 3 Encounters:  09/21/13 104.8 kg (231 lb 0.7 oz)  06/29/13 114.306 kg (252 lb)  06/13/13 116.574 kg (257 lb)     Intake/Output Summary (Last 24 hours) at 09/22/13 1114 Last data filed at 09/22/13 0622  Gross per 24 hour  Intake    648 ml  Output   1750 ml  Net  -1102 ml     Physical Exam  Awake alert and Oriented X 3, No new F.N deficits, Normal affect [place].AT,PERRAL Supple Neck,No JVD, No cervical lymphadenopathy appriciated.  Symmetrical Chest wall movement, Good air  movement bilaterally, few rales RRR,No Gallops,Rubs or new Murmurs, No Parasternal Heave +ve B.Sounds, Abd Soft, Non tender, No organomegaly appriciated, No rebound - guarding or rigidity. No Cyanosis, Clubbing , no edema, No new Rash or bruise     Data Review   Micro Results Recent Results (from the past 240 hour(s))  MRSA PCR SCREENING  Status: None   Collection Time    09/18/13  6:31 AM      Result Value Ref Range Status   MRSA by PCR NEGATIVE  NEGATIVE Final   Comment:            The GeneXpert MRSA Assay (FDA     approved for NASAL specimens     only), is one component of a     comprehensive MRSA colonization     surveillance program. It is not     intended to diagnose MRSA     infection nor to guide or     monitor treatment for     MRSA infections.    Radiology Reports Ct Angio Chest W/cm &/or Wo Cm  09/18/2013   CLINICAL DATA:  73 year old male with chest pain, nausea, vomiting and abdominal/pelvic pain.  EXAM: CT ANGIOGRAPHY CHEST  CT ABDOMEN AND PELVIS WITH CONTRAST  TECHNIQUE: Multidetector CT imaging of the chest was performed using the standard protocol during bolus administration of intravenous contrast. Multiplanar CT image reconstructions and MIPs were obtained to evaluate the vascular anatomy. Multidetector CT imaging of the abdomen and pelvis was performed using the standard protocol during bolus administration of intravenous contrast.  CONTRAST:  OMNIPAQUE IOHEXOL 350 MG/ML SOLN  COMPARISON:  08/09/2013 and prior abdominal pelvic CTs.  FINDINGS: CTA CHEST FINDINGS  This is a technically adequate study although respiratory motion artifact decreases sensitivity in several areas.  There is no evidence of pulmonary emboli. The central pulmonary arteries are enlarged compatible with pulmonary arterial hypertension.  Cardiomegaly and heavy coronary artery calcifications are present.  There is no evidence of thoracic aortic aneurysm.  A small left pleural effusion and  miniscule right pleural effusion noted.  Mild to moderate bibasilar atelectasis is present.  Mild central ground-glass opacities may represent pulmonary edema.  There is no evidence of consolidation or mass.  No acute or suspicious bony abnormalities are present.  Review of the MIP images confirms the above findings.  CT ABDOMEN and PELVIS FINDINGS  Cirrhosis identified without focal hepatic mass.  Cholelithiasis identified without CT evidence of cholecystitis.  Spleen, pancreas, adrenal glands are unremarkable.  Bilateral renal cortical thinning and renal cysts again noted.  There is no evidence of free fluid, enlarged lymph nodes, biliary dilation or abdominal aortic aneurysm.  The bowel and bladder are unremarkable.  No acute or suspicious bony abnormalities are identified. Degenerative changes with throughout the lumbar spine again noted.  IMPRESSION: No evidence of pulmonary emboli or aortic aneurysm.  Cardiomegaly with small bilateral pleural effusions, bibasilar atelectasis and question interstitial pulmonary edema.  Cirrhosis.  Cholelithiasis.  Coronary artery disease.   Electronically Signed   By: Laveda Abbe M.D.   On: 09/18/2013 00:51   Ct Abdomen Pelvis W Contrast  09/18/2013   CLINICAL DATA:  73 year old male with chest pain, nausea, vomiting and abdominal/pelvic pain.  EXAM: CT ANGIOGRAPHY CHEST  CT ABDOMEN AND PELVIS WITH CONTRAST  TECHNIQUE: Multidetector CT imaging of the chest was performed using the standard protocol during bolus administration of intravenous contrast. Multiplanar CT image reconstructions and MIPs were obtained to evaluate the vascular anatomy. Multidetector CT imaging of the abdomen and pelvis was performed using the standard protocol during bolus administration of intravenous contrast.  CONTRAST:  OMNIPAQUE IOHEXOL 350 MG/ML SOLN  COMPARISON:  08/09/2013 and prior abdominal pelvic CTs.  FINDINGS: CTA CHEST FINDINGS  This is a technically adequate study although respiratory  motion artifact decreases sensitivity in several areas.  There is no evidence of pulmonary emboli. The central pulmonary arteries are enlarged compatible with pulmonary arterial hypertension.  Cardiomegaly and heavy coronary artery calcifications are present.  There is no evidence of thoracic aortic aneurysm.  A small left pleural effusion and miniscule right pleural effusion noted.  Mild to moderate bibasilar atelectasis is present.  Mild central ground-glass opacities may represent pulmonary edema.  There is no evidence of consolidation or mass.  No acute or suspicious bony abnormalities are present.  Review of the MIP images confirms the above findings.  CT ABDOMEN and PELVIS FINDINGS  Cirrhosis identified without focal hepatic mass.  Cholelithiasis identified without CT evidence of cholecystitis.  Spleen, pancreas, adrenal glands are unremarkable.  Bilateral renal cortical thinning and renal cysts again noted.  There is no evidence of free fluid, enlarged lymph nodes, biliary dilation or abdominal aortic aneurysm.  The bowel and bladder are unremarkable.  No acute or suspicious bony abnormalities are identified. Degenerative changes with throughout the lumbar spine again noted.  IMPRESSION: No evidence of pulmonary emboli or aortic aneurysm.  Cardiomegaly with small bilateral pleural effusions, bibasilar atelectasis and question interstitial pulmonary edema.  Cirrhosis.  Cholelithiasis.  Coronary artery disease.   Electronically Signed   By: Laveda Abbe M.D.   On: 09/18/2013 00:51      Dg Abd Acute W/chest  09/17/2013   CLINICAL DATA:  Abdominal and chest pain for 2 days.  EXAM: ACUTE ABDOMEN SERIES (ABDOMEN 2 VIEW & CHEST 1 VIEW)  COMPARISON:  08/21/2013  FINDINGS: Patient's left-sided transvenous pacemaker with leads to the right ventricle. The heart is enlarged. There is pulmonary vascular congestion without overt edema. No definite free intraperitoneal air. Bowel gas pattern is nonobstructive. There is  mild gaseous distension of the stomach.  IMPRESSION: 1. Cardiomegaly.  Pulmonary vascular congestion. 2. Nonobstructive bowel gas pattern.   Electronically Signed   By: Rosalie Gums M.D.   On: 09/17/2013 21:33       CBC  Recent Labs Lab 09/17/13 2017 09/18/13 0710 09/19/13 0256 09/20/13 0235 09/21/13 0452  WBC 11.0* 8.1 14.3* 12.5* 9.5  HGB 12.3* 12.8* 12.2* 12.0* 12.5*  HCT 37.6* 38.5* 36.5* 36.3* 37.6*  PLT 159 166 PLATELET CLUMPS NOTED ON SMEAR 161 171  MCV 91.7 91.9 91.3 91.7 91.9  MCH 30.0 30.5 30.5 30.3 30.6  MCHC 32.7 33.2 33.4 33.1 33.2  RDW 16.5* 16.8* 16.9* 17.0* 17.4*  LYMPHSABS  --  0.4*  --   --   --   MONOABS  --  0.1  --   --   --   EOSABS  --  0.0  --   --   --   BASOSABS  --  0.0  --   --   --     Chemistries   Recent Labs Lab 09/17/13 2017 09/18/13 0710 09/19/13 0256 09/20/13 0235 09/21/13 0452 09/22/13 0326  NA 140 137 135* 134* 133* 137  K 4.3 4.4 3.6* 4.0 4.3 3.8  CL 103 100 96 95* 92* 93*  CO2 21 19 21 26 28 27   GLUCOSE 133* 187* 120* 103* 123* 137*  BUN 14 15 20 20 20 23   CREATININE 1.27 1.20 1.20 1.25 1.35 1.41*  CALCIUM 9.1 9.0 9.0 9.2 9.5 9.4  MG 1.7  --   --   --   --   --   AST 20 19 17   --   --   --   ALT 10 10 8   --   --   --  ALKPHOS 95 93 82  --   --   --   BILITOT 0.5 0.5 0.4  --   --   --    ------------------------------------------------------------------------------------------------------------------ estimated creatinine clearance is 55.7 ml/min (by C-G formula based on Cr of 1.41). ------------------------------------------------------------------------------------------------------------------ No results found for this basename: HGBA1C,  in the last 72 hours ------------------------------------------------------------------------------------------------------------------ No results found for this basename: CHOL, HDL, LDLCALC, TRIG, CHOLHDL, LDLDIRECT,  in the last 72  hours ------------------------------------------------------------------------------------------------------------------ No results found for this basename: TSH, T4TOTAL, FREET3, T3FREE, THYROIDAB,  in the last 72 hours ------------------------------------------------------------------------------------------------------------------ No results found for this basename: VITAMINB12, FOLATE, FERRITIN, TIBC, IRON, RETICCTPCT,  in the last 72 hours  Coagulation profile  Recent Labs Lab 09/17/13 09/17/13 2017 09/20/13 0235 09/21/13 0452 09/22/13 0326  INR 1.6 1.57* 1.19 1.34 1.52*    No results found for this basename: DDIMER,  in the last 72 hours  Cardiac Enzymes  Recent Labs Lab 09/18/13 1247 09/18/13 1430 09/18/13 2041  TROPONINI <0.30 <0.30 <0.30   ------------------------------------------------------------------------------------------------------------------ No components found with this basename: POCBNP,      Time Spent in minutes  35   Susa RaringSINGH,PRASHANT K M.D on 09/22/2013 at 11:14 AM  Between 7am to 7pm - Pager - [phone]  After 7pm go to [email or web address] - password [email or web address]  And look for the night coverage person covering for me after hours  [facility] Office  [phone]

## 2013-09-23 LAB — GLUCOSE, CAPILLARY
GLUCOSE-CAPILLARY: 130 mg/dL — AB (ref 70–99)
GLUCOSE-CAPILLARY: 121 mg/dL — AB (ref 70–99)
Glucose-Capillary: 154 mg/dL — ABNORMAL HIGH (ref 70–99)
Glucose-Capillary: 102 mg/dL — ABNORMAL HIGH (ref 70–99)

## 2013-09-23 LAB — BASIC METABOLIC PANEL
BUN: 23 mg/dL (ref 6–23)
CALCIUM: 9.5 mg/dL (ref 8.4–10.5)
CO2: 29 mEq/L (ref 19–32)
Chloride: 95 mEq/L — ABNORMAL LOW (ref 96–112)
Creatinine, Ser: 1.42 mg/dL — ABNORMAL HIGH (ref 0.50–1.35)
GFR calc Af Amer: 55 mL/min — ABNORMAL LOW (ref >90)
GFR, EST NON AFRICAN AMERICAN: 47 mL/min — AB (ref >90)
Glucose, Bld: 129 mg/dL — ABNORMAL HIGH (ref 70–99)
Potassium: 4.6 mEq/L (ref 3.7–5.3)
Sodium: 139 mEq/L (ref 137–147)

## 2013-09-23 LAB — HEPARIN LEVEL (UNFRACTIONATED): HEPARIN UNFRACTIONATED: 0.47 [IU]/mL (ref 0.30–0.70)

## 2013-09-23 LAB — PROTIME-INR
INR: 1.78 — AB (ref 0.00–1.49)
Prothrombin Time: 20.2 seconds — ABNORMAL HIGH (ref 11.6–15.2)

## 2013-09-23 MED ORDER — ISOSORBIDE MONONITRATE ER 30 MG PO TB24
30.0000 mg | ORAL_TABLET | Freq: Every day | ORAL | Status: DC
  Administered 2013-09-23 – 2013-09-24 (×2): 30 mg via ORAL
  Filled 2013-09-23 (×2): qty 1

## 2013-09-23 MED ORDER — WARFARIN SODIUM 10 MG PO TABS
10.0000 mg | ORAL_TABLET | Freq: Once | ORAL | Status: AC
  Administered 2013-09-23: 10 mg via ORAL
  Filled 2013-09-23: qty 1

## 2013-09-23 NOTE — Progress Notes (Signed)
Patient Demographics  Bruce Taylor, is a 73 y.o. male, DOB - 27-Mar-1941, UJW:[id number]  Admit date - 09/17/2013   Admitting Physician Eduard Clos, MD  Outpatient Primary MD for the patient is  [name], MD  LOS - 6   Chief Complaint  Patient presents with  . Chest Pain       Brief narrative:   73 yo patient with a history of CAD and previous stents, atrial fibrillation status post pacemaker implantation, sleep apnea, CHF as well as COPD on home oxygen at 3 L per minute. He presented to the emergency department with complaints of chest pain and shortness of breath that awakened him from sleeping. He also had some nausea and vomiting. The pain was primarily located in the left chest was retrosternal. In addition he been having diarrhea x2 episodes. No abdominal pain. In the emergency department a CT angiogram of the chest was completed that did not demonstrate PE but was concerning for CHF. The patient's troponin was mildly elevated and he was placed on a 50% mask. At the time of admission he was chest pain free. He was also evaluated by the cardiology team in the ER and was subsequently started on IV Lasix and heparin.     Assessment & Plan    1. Acute respiratory failure - Secondary to acute on chronic nonspecific CHF along with possible early CAP -  Echo done here shows EF of 50-55% with insufficient evaluation for diastolic function. Most likely patient has acute on chronic diastolic CHF, responded well to IV diuretics, cardiology is following, he is clinically feeling better, is back to 3 L nasal cannula oxygen which is his home dose oxygen, stopped Lasix as now has ARF, developing mild hyponatremia and edema completely resolved, continue beta blocker, long-acting nitrate to reduce  dose . Placed on fluid and salt restriction.   CAP - with possible infiltrate on CT scan of the chest, and became much improved, afebrile, cough and shortness of breath almost completely resolved, stop Levaquin after the dose on 09/23/2013.     2. Chronic atrial fibrillation. Goal will be rate control, continue beta blocker, Coumadin to be dosed by pharmacy, on heparin bridge for now.  Lab Results  Component Value Date   INR 1.78* 09/23/2013   INR 1.52* 09/22/2013   INR 1.34 09/21/2013     3.DM 2 - continue home regimen which consists of, long-acting insulin, Lingaliptin along with sliding scale. Monitor CBGs.   CBG (last 3)   Recent Labs  09/22/13 1623 09/22/13 2102 09/23/13 0557  GLUCAP 161* 113* 130*     4. Dyslipidemia. Continue home dose statin.    5. History of COPD. No acute issues. No wheezing on exam, supportive care with oxygen and nebulizer treatments as needed.    6. Falsely elevated point of care troponin. Subsequent lab troponins were negative, no EKG change, no NSTEMI.    7. Low potassium. Replaced and stable.    8. History of depression. On Celexa.    9. Obstipation- better with bowel regimen.    10.ARF - hold diuresis, repeat BMP in the morning. stable.      Code Status: Full  Family Communication: None present  Disposition Plan: To be decided  Procedures CT chest, abdomen pelvis, chest x-ray, echo   Consults Cards   Medications  Scheduled Meds: . allopurinol  300 mg Oral Daily  . aspirin EC  325 mg Oral Daily  . atorvastatin  80 mg Oral q1800  . bisacodyl  10 mg Rectal Daily  . budesonide  0.5 mg Nebulization BID  . citalopram  20 mg Oral Daily  . gabapentin  300 mg Oral Daily  . insulin aspart  0-9 Units Subcutaneous TID WC  . insulin aspart protamine- aspart  15 Units Subcutaneous BID WC  . ipratropium-albuterol  3 mL Nebulization Q6H  . isosorbide mononitrate  30 mg Oral Daily  . levofloxacin  750 mg Oral Daily   . linagliptin  5 mg Oral Daily  . magnesium oxide  400 mg Oral Daily  . metoprolol tartrate  12.5 mg Oral BID  . pantoprazole  40 mg Oral Daily  . polyethylene glycol  17 g Oral BID  . sodium chloride  3 mL Intravenous Q12H  . Warfarin - Pharmacist Dosing Inpatient   Does not apply q1800   Continuous Infusions: . heparin 1,400 Units/hr (09/21/13 2019)   PRN Meds:.acetaminophen, albuterol, hydrALAZINE, magnesium hydroxide, nitroGLYCERIN, ondansetron (ZOFRAN) IV, oxybutynin  DVT Prophylaxis  Coumadin - Heparin   Lab Results  Component Value Date   PLT 171 09/21/2013    Antibiotics     Anti-infectives   Start     Dose/Rate Route Frequency Ordered Stop   09/21/13 1000  levofloxacin (LEVAQUIN) tablet 750 mg     750 mg Oral Daily 09/21/13 0902 09/24/13 0959   09/19/13 1200  levofloxacin (LEVAQUIN) IVPB 750 mg  Status:  Discontinued     750 mg 100 mL/hr over 90 Minutes Intravenous Daily 09/19/13 1058 09/21/13 0902          Subjective:   [name] today has, No headache, No chest pain, No abdominal pain - No Nausea, No new weakness tingling or numbness, No Cough - SOB.    Objective:   Filed Vitals:   09/22/13 2030 09/22/13 2112 09/23/13 0212 09/23/13 0408  BP: 107/59   100/58  Pulse: 53 55 60 56  Temp: 97.5 F (36.4 C)   97.5 F (36.4 C)  TempSrc: Oral   Oral  Resp: 18 18 18 18   Height:      Weight:    103.4 kg (227 lb 15.3 oz)  SpO2: 94% 93% 94% 96%    Wt Readings from Last 3 Encounters:  09/23/13 103.4 kg (227 lb 15.3 oz)  06/29/13 114.306 kg (252 lb)  06/13/13 116.574 kg (257 lb)     Intake/Output Summary (Last 24 hours) at 09/23/13 0926 Last data filed at 09/23/13 0600  Gross per 24 hour  Intake 930.87 ml  Output   2100 ml  Net -1169.13 ml     Physical Exam  Awake alert and Oriented X 3, No new F.N deficits, Normal affect [place].AT,PERRAL Supple Neck,No JVD, No cervical lymphadenopathy appriciated.  Symmetrical Chest wall movement, Good air  movement bilaterally, few rales RRR,No Gallops,Rubs or new Murmurs, No Parasternal Heave +ve B.Sounds, Abd Soft, Non tender, No organomegaly appriciated, No rebound - guarding or rigidity. No Cyanosis, Clubbing , no edema, No new Rash or bruise     Data Review   Micro Results Recent Results (from the past 240 hour(s))  MRSA PCR SCREENING     Status: None   Collection Time    09/18/13  6:31 AM  Result Value Ref Range Status   MRSA by PCR NEGATIVE  NEGATIVE Final   Comment:            The GeneXpert MRSA Assay (FDA     approved for NASAL specimens     only), is one component of a     comprehensive MRSA colonization     surveillance program. It is not     intended to diagnose MRSA     infection nor to guide or     monitor treatment for     MRSA infections.    Radiology Reports Ct Angio Chest W/cm &/or Wo Cm  09/18/2013   CLINICAL DATA:  73 year old male with chest pain, nausea, vomiting and abdominal/pelvic pain.  EXAM: CT ANGIOGRAPHY CHEST  CT ABDOMEN AND PELVIS WITH CONTRAST  TECHNIQUE: Multidetector CT imaging of the chest was performed using the standard protocol during bolus administration of intravenous contrast. Multiplanar CT image reconstructions and MIPs were obtained to evaluate the vascular anatomy. Multidetector CT imaging of the abdomen and pelvis was performed using the standard protocol during bolus administration of intravenous contrast.  CONTRAST:  OMNIPAQUE IOHEXOL 350 MG/ML SOLN  COMPARISON:  08/09/2013 and prior abdominal pelvic CTs.  FINDINGS: CTA CHEST FINDINGS  This is a technically adequate study although respiratory motion artifact decreases sensitivity in several areas.  There is no evidence of pulmonary emboli. The central pulmonary arteries are enlarged compatible with pulmonary arterial hypertension.  Cardiomegaly and heavy coronary artery calcifications are present.  There is no evidence of thoracic aortic aneurysm.  A small left pleural effusion and  miniscule right pleural effusion noted.  Mild to moderate bibasilar atelectasis is present.  Mild central ground-glass opacities may represent pulmonary edema.  There is no evidence of consolidation or mass.  No acute or suspicious bony abnormalities are present.  Review of the MIP images confirms the above findings.  CT ABDOMEN and PELVIS FINDINGS  Cirrhosis identified without focal hepatic mass.  Cholelithiasis identified without CT evidence of cholecystitis.  Spleen, pancreas, adrenal glands are unremarkable.  Bilateral renal cortical thinning and renal cysts again noted.  There is no evidence of free fluid, enlarged lymph nodes, biliary dilation or abdominal aortic aneurysm.  The bowel and bladder are unremarkable.  No acute or suspicious bony abnormalities are identified. Degenerative changes with throughout the lumbar spine again noted.  IMPRESSION: No evidence of pulmonary emboli or aortic aneurysm.  Cardiomegaly with small bilateral pleural effusions, bibasilar atelectasis and question interstitial pulmonary edema.  Cirrhosis.  Cholelithiasis.  Coronary artery disease.   Electronically Signed   By: Laveda Abbe M.D.   On: 09/18/2013 00:51   Ct Abdomen Pelvis W Contrast  09/18/2013   CLINICAL DATA:  73 year old male with chest pain, nausea, vomiting and abdominal/pelvic pain.  EXAM: CT ANGIOGRAPHY CHEST  CT ABDOMEN AND PELVIS WITH CONTRAST  TECHNIQUE: Multidetector CT imaging of the chest was performed using the standard protocol during bolus administration of intravenous contrast. Multiplanar CT image reconstructions and MIPs were obtained to evaluate the vascular anatomy. Multidetector CT imaging of the abdomen and pelvis was performed using the standard protocol during bolus administration of intravenous contrast.  CONTRAST:  OMNIPAQUE IOHEXOL 350 MG/ML SOLN  COMPARISON:  08/09/2013 and prior abdominal pelvic CTs.  FINDINGS: CTA CHEST FINDINGS  This is a technically adequate study although respiratory  motion artifact decreases sensitivity in several areas.  There is no evidence of pulmonary emboli. The central pulmonary arteries are enlarged compatible with pulmonary arterial  hypertension.  Cardiomegaly and heavy coronary artery calcifications are present.  There is no evidence of thoracic aortic aneurysm.  A small left pleural effusion and miniscule right pleural effusion noted.  Mild to moderate bibasilar atelectasis is present.  Mild central ground-glass opacities may represent pulmonary edema.  There is no evidence of consolidation or mass.  No acute or suspicious bony abnormalities are present.  Review of the MIP images confirms the above findings.  CT ABDOMEN and PELVIS FINDINGS  Cirrhosis identified without focal hepatic mass.  Cholelithiasis identified without CT evidence of cholecystitis.  Spleen, pancreas, adrenal glands are unremarkable.  Bilateral renal cortical thinning and renal cysts again noted.  There is no evidence of free fluid, enlarged lymph nodes, biliary dilation or abdominal aortic aneurysm.  The bowel and bladder are unremarkable.  No acute or suspicious bony abnormalities are identified. Degenerative changes with throughout the lumbar spine again noted.  IMPRESSION: No evidence of pulmonary emboli or aortic aneurysm.  Cardiomegaly with small bilateral pleural effusions, bibasilar atelectasis and question interstitial pulmonary edema.  Cirrhosis.  Cholelithiasis.  Coronary artery disease.   Electronically Signed   By: Laveda Abbe M.D.   On: 09/18/2013 00:51      Dg Abd Acute W/chest  09/17/2013   CLINICAL DATA:  Abdominal and chest pain for 2 days.  EXAM: ACUTE ABDOMEN SERIES (ABDOMEN 2 VIEW & CHEST 1 VIEW)  COMPARISON:  08/21/2013  FINDINGS: Patient's left-sided transvenous pacemaker with leads to the right ventricle. The heart is enlarged. There is pulmonary vascular congestion without overt edema. No definite free intraperitoneal air. Bowel gas pattern is nonobstructive. There is  mild gaseous distension of the stomach.  IMPRESSION: 1. Cardiomegaly.  Pulmonary vascular congestion. 2. Nonobstructive bowel gas pattern.   Electronically Signed   By: Rosalie Gums M.D.   On: 09/17/2013 21:33       CBC  Recent Labs Lab 09/17/13 2017 09/18/13 0710 09/19/13 0256 09/20/13 0235 09/21/13 0452  WBC 11.0* 8.1 14.3* 12.5* 9.5  HGB 12.3* 12.8* 12.2* 12.0* 12.5*  HCT 37.6* 38.5* 36.5* 36.3* 37.6*  PLT 159 166 PLATELET CLUMPS NOTED ON SMEAR 161 171  MCV 91.7 91.9 91.3 91.7 91.9  MCH 30.0 30.5 30.5 30.3 30.6  MCHC 32.7 33.2 33.4 33.1 33.2  RDW 16.5* 16.8* 16.9* 17.0* 17.4*  LYMPHSABS  --  0.4*  --   --   --   MONOABS  --  0.1  --   --   --   EOSABS  --  0.0  --   --   --   BASOSABS  --  0.0  --   --   --     Chemistries   Recent Labs Lab 09/17/13 2017 09/18/13 0710 09/19/13 0256 09/20/13 0235 09/21/13 0452 09/22/13 0326 09/23/13 0341  NA 140 137 135* 134* 133* 137 139  K 4.3 4.4 3.6* 4.0 4.3 3.8 4.6  CL 103 100 96 95* 92* 93* 95*  CO2 21 19 21 26 28 27 29   GLUCOSE 133* 187* 120* 103* 123* 137* 129*  BUN 14 15 20 20 20 23 23   CREATININE 1.27 1.20 1.20 1.25 1.35 1.41* 1.42*  CALCIUM 9.1 9.0 9.0 9.2 9.5 9.4 9.5  MG 1.7  --   --   --   --   --   --   AST 20 19 17   --   --   --   --   ALT 10 10 8   --   --   --   --  ALKPHOS 95 93 82  --   --   --   --   BILITOT 0.5 0.5 0.4  --   --   --   --    ------------------------------------------------------------------------------------------------------------------ estimated creatinine clearance is 55 ml/min (by C-G formula based on Cr of 1.42). ------------------------------------------------------------------------------------------------------------------ No results found for this basename: HGBA1C,  in the last 72 hours ------------------------------------------------------------------------------------------------------------------ No results found for this basename: CHOL, HDL, LDLCALC, TRIG, CHOLHDL, LDLDIRECT,   in the last 72 hours ------------------------------------------------------------------------------------------------------------------ No results found for this basename: TSH, T4TOTAL, FREET3, T3FREE, THYROIDAB,  in the last 72 hours ------------------------------------------------------------------------------------------------------------------ No results found for this basename: VITAMINB12, FOLATE, FERRITIN, TIBC, IRON, RETICCTPCT,  in the last 72 hours  Coagulation profile  Recent Labs Lab 09/17/13 2017 09/20/13 0235 09/21/13 0452 09/22/13 0326 09/23/13 0341  INR 1.57* 1.19 1.34 1.52* 1.78*    No results found for this basename: DDIMER,  in the last 72 hours  Cardiac Enzymes  Recent Labs Lab 09/18/13 1247 09/18/13 1430 09/18/13 2041  TROPONINI <0.30 <0.30 <0.30   ------------------------------------------------------------------------------------------------------------------ No components found with this basename: POCBNP,      Time Spent in minutes  35   SINGH,PRASHANT K M.D on 09/23/2013 at 9:26 AM  Between 7am to 7pm - Pager - [phone]  After 7pm go to [email or web address] - password [email or web address]  And look for the night coverage person covering for me after hours  [facility] Office  [phone]

## 2013-09-23 NOTE — Progress Notes (Signed)
ANTICOAGULATION CONSULT NOTE - Follow Up Consult  Pharmacy Consult for Heparin and Coumadin Indication: atrial fibrillation  Allergies  Allergen Reactions  . Penicillins Anaphylaxis and Swelling    "swell up all over my body; throat mainly, I think" (07/13/2012)  . Chlorhexidine Gluconate Itching    Chg cloth "don't remember how bad it was" (07/13/2012)    Patient Measurements: Height: 5\' 10"  (177.8 cm) Weight: 227 lb 15.3 oz (103.4 kg) IBW/kg (Calculated) : 73 Heparin Dosing Weight: ~100kg  Vital Signs: Temp: 97.5 F (36.4 C) (03/01 0408) Temp src: Oral (03/01 0408) BP: 100/58 mmHg (03/01 0408) Pulse Rate: 56 (03/01 0408)  Labs:  Recent Labs  09/21/13 0452 09/22/13 0326 09/23/13 0341  HGB 12.5*  --   --   HCT 37.6*  --   --   PLT 171  --   --   LABPROT 16.3* 17.9* 20.2*  INR 1.34 1.52* 1.78*  HEPARINUNFRC 0.39 0.54 0.47  CREATININE 1.35 1.41* 1.42*    Estimated Creatinine Clearance: 55 ml/min (by C-G formula based on Cr of 1.42).   Medications:  Heparin @ 1400 units/hr  Assessment: 72yom continues on heparin to coumadin bridge for afib. Heparin level is at goal. INR is below goal but trending up. Also continues on day #5 levaquin which can increase INR - stop date in place for 3/2. No CBC today. No bleeding reported. Will try to get him back on his home dose of 10mg  daily except 12mg  Mon/Fri.  Goal of Therapy:  INR 2-3 Heparin level 0.3-0.7 units/ml Monitor platelets by anticoagulation protocol: Yes   Plan:  1) Continue heparin at 1400 units/hr 2) Coumadin 10mg  x 1 tonight 3) Heparin level, INR, CBC in AM  Fredrik RiggerMarkle, [place] Gambone Sue 09/23/2013,10:20 AM

## 2013-09-24 LAB — BASIC METABOLIC PANEL
BUN: 20 mg/dL (ref 6–23)
CO2: 25 mEq/L (ref 19–32)
Calcium: 9.3 mg/dL (ref 8.4–10.5)
Chloride: 93 mEq/L — ABNORMAL LOW (ref 96–112)
Creatinine, Ser: 1.4 mg/dL — ABNORMAL HIGH (ref 0.50–1.35)
GFR calc Af Amer: 56 mL/min — ABNORMAL LOW (ref >90)
GFR, EST NON AFRICAN AMERICAN: 48 mL/min — AB (ref >90)
Glucose, Bld: 134 mg/dL — ABNORMAL HIGH (ref 70–99)
POTASSIUM: 4.2 meq/L (ref 3.7–5.3)
Sodium: 131 mEq/L — ABNORMAL LOW (ref 137–147)

## 2013-09-24 LAB — GLUCOSE, CAPILLARY
Glucose-Capillary: 160 mg/dL — ABNORMAL HIGH (ref 70–99)
Glucose-Capillary: 166 mg/dL — ABNORMAL HIGH (ref 70–99)
Glucose-Capillary: 147 mg/dL — ABNORMAL HIGH (ref 70–99)

## 2013-09-24 LAB — CBC
HCT: 35.9 % — ABNORMAL LOW (ref 39.0–52.0)
Hemoglobin: 12.1 g/dL — ABNORMAL LOW (ref 13.0–17.0)
MCH: 31.1 pg (ref 26.0–34.0)
MCHC: 33.7 g/dL (ref 30.0–36.0)
MCV: 92.3 fL (ref 78.0–100.0)
Platelets: 154 10*3/uL (ref 150–400)
RBC: 3.89 MIL/uL — ABNORMAL LOW (ref 4.22–5.81)
RDW: 17.7 % — ABNORMAL HIGH (ref 11.5–15.5)
WBC: 8.1 10*3/uL (ref 4.0–10.5)

## 2013-09-24 LAB — PROTIME-INR
INR: 1.93 — ABNORMAL HIGH (ref 0.00–1.49)
PROTHROMBIN TIME: 21.5 s — AB (ref 11.6–15.2)

## 2013-09-24 LAB — HEPARIN LEVEL (UNFRACTIONATED)

## 2013-09-24 MED ORDER — WARFARIN SODIUM 6 MG PO TABS
12.0000 mg | ORAL_TABLET | ORAL | Status: DC
  Administered 2013-09-24: 12 mg via ORAL
  Filled 2013-09-24: qty 2

## 2013-09-24 MED ORDER — ALPRAZOLAM 0.5 MG PO TABS: ORAL_TABLET | ORAL | Status: AC

## 2013-09-24 MED ORDER — SPIRONOLACTONE 25 MG PO TABS: 12.5000 mg | ORAL_TABLET | Freq: Two times a day (BID) | ORAL | Status: AC

## 2013-09-24 MED ORDER — FUROSEMIDE 40 MG PO TABS: 40.0000 mg | ORAL_TABLET | Freq: Every day | ORAL | Status: AC

## 2013-09-24 MED ORDER — WARFARIN SODIUM 10 MG PO TABS: 10.0000 mg | ORAL_TABLET | ORAL | Status: DC
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
## 2013-09-24 NOTE — Clinical Social Work Placement (Addendum)
    Clinical Social Work Department CLINICAL SOCIAL WORK PLACEMENT NOTE 09/24/2013  Patient:  Bruce Taylor,Bruce Taylor  Account Number:  [id number] Admit date:  09/17/2013  Clinical Social Worker:  Lupita LeashNNA CROWDER, LCSWA  Date/time:  09/21/2013 05:00 PM  Clinical Social Work is seeking post-discharge placement for this patient at the following level of care:   SKILLED NURSING   (*CSW will update this form in Epic as items are completed)   09/21/2013  Patient/family provided with [facility] Department of Clinical Social Work's list of facilities offering this level of care within the geographic area requested by the patient (or if unable, by the patient's family).  09/21/2013  Patient/family informed of their freedom to choose among providers that offer the needed level of care, that participate in Medicare, Medicaid or managed care program needed by the patient, have an available bed and are willing to accept the patient.  09/21/2013  Patient/family informed of [facility]' ownership interest in [facility], as well as of the fact that they are under no obligation to receive care at this facility.  PASARR submitted to EDS on  PASARR number received from EDS on   FL2 transmitted to all facilities in geographic area requested by pt/family on  09/20/2013 FL2 transmitted to all facilities within larger geographic area on   Patient informed that his/her managed care company has contracts with or will negotiate with  certain facilities, including the following:   Patient has existing PASARR     Patient/family informed of bed offers received:  09/24/2013 [facility] Physician recommends and patient chooses bed at    Patient to be transferred to  on  09/24/2013 Patient to be transferred to facility by EMS  The following physician request were entered in Epic:   Additional Comments:

## 2013-09-24 NOTE — Progress Notes (Signed)
EMS running behind. Transport originally set up for 1500.  Updated RN taking this patient at [facility]. Also notified this RN that patient has already received evening dose of coumadin, novolog, and novolog mix 70/30. Stanton KidneyCURRY, Jaonna Word R

## 2013-09-24 NOTE — Progress Notes (Signed)
Report called and given to RN at [facility]. Patient is to go via EMS transport. Paperwork sent with patient. IV d/c'd, patient taken off cardiac monitor. Bruce Taylor, Bruce Taylor

## 2013-09-24 NOTE — Progress Notes (Addendum)
CSW spoke to patient and patient's family regarding SN[facility] today [facility]. Patient's daughter and patient's son state that they are able to pay privately for SNF placement for a month and to have patient transported by EMS at 3pm.  Bruce KrabbeLindsay Trevante Tennell, MSW, Theresia MajorsLCSWA [phone]

## 2013-09-24 NOTE — Telephone Encounter (Signed)
Signed orders faxed to [facility].

## 2013-09-24 NOTE — Progress Notes (Signed)
Physical Therapy Treatment Patient Details Name: Bruce Taylor MRN: [id number] DOB: 04/29/1941 Today's Date: 09/24/2013 Time: [id number] PT Time Calculation (min): 28 min  PT Assessment / Plan / Recommendation  History of Present Illness Bruce Taylor is a 73 y.o. 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 Patient also had mild shortness of breath with nausea and vomiting.  In the ER patient had CT angiogram of the chest which showed features concerning for CHF.   PT Comments   Pt cooperative, but seemed more fearful with transfers today.  Continue to recommend SNF for rehab after d/c from acute care.  Follow Up Recommendations  SNF;Supervision/Assistance - 24 hour     Does the patient have the potential to tolerate intense rehabilitation     Barriers to Discharge        Equipment Recommendations  None recommended by PT    Recommendations for Other Services    Frequency Min 2X/week   Progress towards PT Goals Progress towards PT goals: Not progressing toward goals - comment (Pt more fearful today)  Plan Current plan remains appropriate    Precautions / Restrictions Precautions Precautions: Fall Restrictions Weight Bearing Restrictions: No   Pertinent Vitals/Pain B shoulder pain and neck pain- did not rate, but placed small hot pack on neck.    Mobility  Bed Mobility Overal bed mobility: Needs Assistance;+2 for physical assistance Bed Mobility: Supine to Sit Rolling: +2 for physical assistance;Mod assist General bed mobility comments:  (cues for hand placement,  Use of pad to get hips turned) Transfers Overall transfer level: Needs assistance Equipment used: None Transfers: Stand Pivot Transfers Sit to Stand: +2 physical assistance;Mod assist Stand pivot transfers: +2 physical assistance;Max  assist General transfer comment: Pt very fearful today with mobility. He was unwilling to try RW for transfer.  Pt c/o pain in shoulders and neck.    Exercises General Exercises - Lower Extremity Ankle Circles/Pumps: 10 reps;Seated;AROM Gluteal Sets: Strengthening;Both;Seated Long Arc Quad: Strengthening;10 reps;Seated Hip ABduction/ADduction: Strengthening;Both;10 reps;Seated Hip Flexion/Marching: Strengthening;Both;10 reps;Seated   PT Diagnosis:    PT Problem List:   PT Treatment Interventions:     PT Goals (current goals can now be found in the care plan section)    Visit Information  Last PT Received On: 09/24/13 Assistance Needed: +2 History of Present Illness: Bruce Taylor is a 73 y.o. 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 Patient also had mild shortness of breath with nausea and vomiting.  In the ER patient had CT angiogram of the chest which showed features concerning for CHF.    Subjective Data      Cognition  Cognition Arousal/Alertness: Awake/alert Behavior During Therapy: WFL for tasks assessed/performed Overall Cognitive Status: Within Functional Limits for tasks assessed Memory: Decreased short-term memory Following Commands: Follows one step commands with increased time    Balance  General Comments General comments (skin integrity, edema, etc.): small hot pack for pt's neck with towel.  End of Session PT - End of Session Equipment Utilized During Treatment: Gait belt;Oxygen Activity Tolerance: Patient limited by fatigue Patient left: in chair;with call bell/phone within reach Nurse Communication: Mobility status;Need for lift equipment   GP  Bruce Taylor 09/24/2013, 12:05 PM

## 2013-09-24 NOTE — Progress Notes (Signed)
Patient is requesting SNF placement- after extensive discussion with patient it was determined that he is a prior resident of [facility] and returned home after rehab and use of his 100 days of Medicare.  Patient has not had a 100 day wellness period for Medicare days to reset.  Patient has been living with his daughter but this situation has become unacceptable and she will not allow patient to return to her home.  CSW met with patient and his son- patient agrees to private pay placement for 1 month to determine what other options may be available. Private pay funds are available per patient and son.  Will send out SNF bed search.  Patient would like [facility] if possible.  CSW psychosocial assessment to follow.  Lorie Phenix. [place], [place]

## 2013-09-24 NOTE — Progress Notes (Signed)
ANTICOAGULATION CONSULT NOTE - Follow Up Consult  Pharmacy Consult for heparin Indication: atrial fibrillation  Labs:  Recent Labs  09/22/13 0326 09/23/13 0341 09/24/13 0349  HGB  --   --  12.1*  HCT  --   --  35.9*  PLT  --   --  154  LABPROT 17.9* 20.2* 21.5*  INR 1.52* 1.78* 1.93*  HEPARINUNFRC 0.54 0.47 <0.10*  CREATININE 1.41* 1.42* 1.40*    Assessment: 73yo male now undetectable on heparin after several levels at goal; per RN no gtt issues with only change being location of IV, no interruption when changing.  Goal of Therapy:  Heparin level 0.3-0.7 units/ml   Plan:  Will increase heparin gtt by 3 units/kg/hr to 1700 units/hr and check level in 6hr.  Vernard GamblesVeronda Eryc Bodey, PharmD, BCPS  09/24/2013,5:20 AM

## 2013-09-24 NOTE — Discharge Summary (Signed)
Bruce Taylor, is a 73 y.o. male  DOB [birth year]  MRN [id number].  Admission date:  09/17/2013  Admitting Physician  Eduard Clos, MD  Discharge Date:  09/24/2013   Primary MD   [name], MD  Recommendations for primary care physician for things to follow:   Monitor BMP, weight, fluid status and diuretic dose closely.  Strict 1.5 lit/day of free water and ice restriction.   Admission Diagnosis  Acute respiratory failure [518.81] CAD (coronary artery disease) [414.00] Hypoxia [799.02] Chronic a-fib [427.31] NSTEMI (non-ST elevated myocardial infarction) [410.70] DM (diabetes mellitus), type 2 with complications [250.90] COPD (chronic obstructive pulmonary disease) with emphysema [492.8] Chest pain [786.50] Acute on chronic diastolic CHF (congestive heart failure) [428.33, 428.0]   Discharge Diagnosis  Acute respiratory failure [518.81] CAD (coronary artery disease) [414.00] Hypoxia [799.02] Chronic a-fib [427.31] NSTEMI (non-ST elevated myocardial infarction) [410.70] DM (diabetes mellitus), type 2 with complications [250.90] COPD (chronic obstructive pulmonary disease) with emphysema [492.8] Chest pain [786.50] Acute on chronic diastolic CHF (congestive heart failure) [428.33, 428.0]     Principal Problem:   Acute respiratory failure Active Problems:   HYPERTENSION   COPD (chronic obstructive pulmonary disease) with emphysema   Unstable angina   DM (diabetes mellitus), type 2 with complications   Chronic a-fib      Past Medical History  Diagnosis Date  . Hyperlipidemia   . Hypertension   . 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 Chronic atrial fibrillation   . Ischemic cardiomyopathy     EF 45% by echo 2010  . COPD (chronic obstructive pulmonary disease) 02/04/2010  . On home O2    "24/7"  . Renal insufficiency   . Tremor   . Angina pectoris   . Obesity, morbid 06/15/2011  . Coronary artery disease     Multiple PCIs  . Myocardial infarct ? 1995  . Blood transfusion ?2011    "hemorrhaging out my penis" (07/13/2012)  . Anemia   . Ulcer     "don't know what kind; it was years ago"  . CHF (congestive heart failure) 04/29/2009    2D Echo - EF 45-50%, normal  . Shortness of breath     "all the time" (07/13/2012)  . Obstructive sleep apnea on CPAP   . Type II diabetes mellitus   . GERD (gastroesophageal reflux disease)   . Stomach ulcer 1980's  . Arthritis     "lower back" (07/13/2012)  . Chronic lower back pain   . Hematuria ? 2011  . PAD (peripheral artery disease) 05/09/2012    Lower Arterial Exam - Right and left distal external iliac arteries-equal to or less than 50% diameter reductions, Bilateral SFA-suggestive 50-69% diameter reduction,right posterior tibial artery and left peroneal artery both occluded  . Bruit 07/23/2010    Carotid Duplex - Right and left ICAs demonstrate small amount of fibrous plaque not hemodynamically significant  . Complication of anesthesia     pt states was very difficult to wake up  .  Pacemaker 2010    Past Surgical History  Procedure Laterality Date  . Coronary stent placement      x 8  . Back surgery      x3  . Coronary angioplasty    . Coronary stent placement  10/2010    Has stents in LAD, RCA,LCX  . Coronary angioplasty with stent placement      "? total of 9 stents over the years; 6 at one time"  . Tonsillectomy      "I was a little boy" (07/13/2012)  . Appendectomy  1954  . Cataract extraction w/ intraocular lens  implant, bilateral  ~ 2011  . Insert / replace / remove pacemaker  07/2008  . Lumbar laminectomy  1990's    "had an extra lower lumbar; had it taken out" (07/13/2012)  . Lumbar disc surgery  1990's X    "cleaned up mess from 1st then 2nd OR" (07/13/2012)  . Hemorrhoid surgery      "in the dr's  office; don't know if they cut them out or banded them"  . Transesophageal echocardiogram  11/13/2004    EF 45-50%, no evidence of intracardiac mass or thrombus, does appear to be a small AST with mild left-to-right and right-to-left shunting  . Cardiac catheterization  06/16/2011    RCA 90% stenosis 3.5x48mm noncompliant Trek balloon was used for high-pressure noncompliant dilation up to approximately 3.51 proximally and 3.4 distally entire segment was reduced to less than 5%  . Cardiac catheterization  10/27/2010    95% stenosis of the distal RCA, 3x57mm bare-metal non-DES Medtronic Integrity stent to overlap stenosis, post-dilated with a 3.5x21 Alton Sprinter balloon to 3.37mm. As balloon was pulled back, there was progressive increase in dilation to 3.53mm  . Cardiac catheterization  10/26/2010    RCA - discrete 95% proximal in-stent restenosis, mid vessel 80%discrete stenosis;   . Cardiac catheterization  07/30/2008    2x22mm angioscope balloon cuts 3 to 5 atmospheric pressure  . Flexible sigmoidoscopy N/A 04/09/2013    Procedure: FLEXIBLE SIGMOIDOSCOPY;  Surgeon: Petra Kuba, MD;  Location: [facility] ENDOSCOPY;  Service: Endoscopy;  Laterality: N/A;  . Esophagogastroduodenoscopy N/A 08/21/2013    Procedure: ESOPHAGOGASTRODUODENOSCOPY (EGD);  Surgeon: Petra Kuba, MD;  Location: [facility] ENDOSCOPY;  Service: Endoscopy;  Laterality: N/A;     Discharge Condition: Stable   Follow UP  Follow-up Information   Follow up with  [name], MD. Schedule an appointment as soon as possible for a visit in 1 week.   Specialty:  Family Medicine   Contact information:   [address] [place] [address] [phone]         Discharge Instructions  and  Discharge Medications      Discharge Orders   Future Appointments Provider Department Dept Phone   12/11/2013 10:00 AM Barbaraann Share, MD [facility] [phone]   Future Orders Complete By Expires   Discharge instructions  As directed     Comments:     Follow with Primary MD  [name], MD in 7 days   Get INR checked in 2 days at nursing facility.  Get CBC, CMP, checked 7 days by nursing facility.  Get a 2 view Chest X ray done in 1 week.   Activity: As tolerated with Full fall precautions use walker/cane & assistance as needed   Disposition SNF   Diet: Heart Healthy - low carbohydrate with strict 1.5 L free water restriction this includes ice , feeding assistance and  aspiration precautions if needed.   Check your Weight same time everyday, if you gain over 2 pounds, or you develop in leg swelling, experience more shortness of breath or chest pain, call your Primary MD immediately. Follow Cardiac Low Salt Diet and 1.5 lit/day fluid restriction.   On your next visit with her primary care physician please Get Medicines reviewed and adjusted.  Please request your Prim.MD to go over all Hospital Tests and Procedure/Radiological results at the follow up, please get all Hospital records sent to your Prim MD by signing hospital release before you go home.   If you experience worsening of your admission symptoms, develop shortness of breath, life threatening emergency, suicidal or homicidal thoughts you must seek medical attention immediately by calling 911 or calling your MD immediately  if symptoms less severe.  You Must read complete instructions/literature along with all the possible adverse reactions/side effects for all the Medicines you take and that have been prescribed to you. Take any new Medicines after you have completely understood and accpet all the possible adverse reactions/side effects.   Do not drive and provide baby sitting services if your were admitted for syncope or siezures until you have seen by Primary MD or a Neurologist and advised to do so again.  Do not drive when taking Pain medications.    Do not take more than prescribed Pain, Sleep and Anxiety Medications  Special Instructions: If you  have smoked or chewed Tobacco  in the last 2 yrs please stop smoking, stop any regular Alcohol  and or any Recreational drug use.  Wear Seat belts while driving.   Please note  You were cared for by a hospitalist during your hospital stay. If you have any questions about your discharge medications or the care you received while you were in the hospital after you are discharged, you can call the unit and asked to speak with the hospitalist on call if the hospitalist that took care of you is not available. Once you are discharged, your primary care physician will handle any further medical issues. Please note that NO REFILLS for any discharge medications will be authorized once you are discharged, as it is imperative that you return to your primary care physician (or establish a relationship with a primary care physician if you do not have one) for your aftercare needs so that they can reassess your need for medications and monitor your lab values.   Increase activity slowly  As directed        Medication List         allopurinol 300 MG tablet  Commonly known as:  ZYLOPRIM  Take 300 mg by mouth daily.     ALPRAZolam 0.5 MG tablet  Commonly known as:  XANAX  Take one tablet by mouth twice daily for anxiety     budesonide 0.5 MG/2ML nebulizer solution  Commonly known as:  PULMICORT  Take 0.5 mg by nebulization 2 (two) times daily.     citalopram 40 MG tablet  Commonly known as:  CELEXA  Take 40 mg by mouth daily.     diphenoxylate-atropine 2.5-0.025 MG per tablet  Commonly known as:  LOMOTIL  Take 1 tablet by mouth 4 (four) times daily as needed for diarrhea or loose stools.     docusate sodium 100 MG capsule  Commonly known as:  COLACE  Take 100 mg by mouth 2 (two) times daily as needed for mild constipation.     furosemide 40 MG tablet  Commonly known as:  LASIX  Take 1 tablet (40 mg total) by mouth daily.  Start taking on:  09/27/2013     gabapentin 300 MG capsule  Commonly  known as:  NEURONTIN  Take 300 mg by mouth daily.     insulin aspart 100 UNIT/ML FlexPen  Commonly known as:  NOVOLOG  Inject 15 Units into the skin 2 (two) times daily with a meal.     RELION 70/30 (70-30) 100 UNIT/ML injection  Generic drug:  insulin NPH-regular Human  Inject into the skin. Sliding scale     insulin NPH-regular Human (70-30) 100 UNIT/ML injection  Commonly known as:  RELION 70/30  Inject 15 Units into the skin 2 (two) times daily with a meal.     isosorbide mononitrate 60 MG 24 hr tablet  Commonly known as:  IMDUR  Take 60 mg by mouth daily.     Magnesium 250 MG Tabs  Take 250 mg by mouth daily.     metFORMIN 500 MG tablet  Commonly known as:  GLUCOPHAGE  Take 500 mg by mouth daily with breakfast.     metoprolol tartrate 25 MG tablet  Commonly known as:  LOPRESSOR  Take 0.5 tablets (12.5 mg total) by mouth 2 (two) times daily.     nitroGLYCERIN 0.4 MG SL tablet  Commonly known as:  NITROSTAT  Place 0.4 mg under the tongue every 5 (five) minutes as needed for chest pain.     oxybutynin 5 MG tablet  Commonly known as:  DITROPAN  Take 5 mg by mouth 3 (three) times daily as needed (for urinary incontinence).     oxymetazoline 0.05 % nasal spray  Commonly known as:  AFRIN  Place 1 spray into both nostrils 2 (two) times daily as needed for congestion.     polyethylene glycol packet  Commonly known as:  MIRALAX / GLYCOLAX  Take 17 g by mouth daily as needed for mild constipation.     primidone 50 MG tablet  Commonly known as:  MYSOLINE  Take 1-2 tablets (50-100 mg total) by mouth 2 (two) times daily. 1 tab in the morning and 2 tabs at night     PROAIR HFA 108 (90 BASE) MCG/ACT inhaler  Generic drug:  albuterol  Inhale 1-2 puffs into the lungs every 6 (six) hours as needed for shortness of breath. For shortness of breath     albuterol (5 MG/ML) 0.5% nebulizer solution  Commonly known as:  PROVENTIL  Take 0.5 mLs (2.5 mg total) by nebulization every 4  (four) hours as needed for wheezing or shortness of breath.     simvastatin 80 MG tablet  Commonly known as:  ZOCOR  Take 40 mg by mouth at bedtime.     sitaGLIPtin 50 MG tablet  Commonly known as:  JANUVIA  Take 50 mg by mouth daily.     spironolactone 25 MG tablet  Commonly known as:  ALDACTONE  Take 0.5 tablets (12.5 mg total) by mouth 2 (two) times daily.  Start taking on:  09/27/2013     warfarin 5 MG tablet  Commonly known as:  COUMADIN  Take 10-12 mg by mouth daily. 10mg  daily x 12mg  each Monday and Friday     warfarin 4 MG tablet  Commonly known as:  COUMADIN  Take 4 mg by mouth daily. 10mg  daily x 12mg  each Monday and Friday          Diet and Activity recommendation: See Discharge Instructions above  Consults obtained - cardiology   Major procedures and Radiology Reports - PLEASE review detailed and final reports for all details, in brief -    Echogram  - Left ventricle: The cavity size was normal. Wall thickness was normal. Systolic function was normal. The estimated ejection fraction was in the range of 50% to 55%. Wall motion was normal; there were no regional wall motion abnormalities. The study is not technically sufficient to allow evaluation of LV diastolic function. - Ventricular septum: Septal motion showed paradox. These changes are consistent with right ventricular pacing. - Left atrium: The atrium was severely dilated. - Right ventricle: The cavity size was mildly dilated. Systolic function was moderately to severely reduced. - Right atrium: The atrium was moderately dilated. - Pulmonary arteries: Systolic pressure was mildly to moderately increased. PA peak pressure: 46mm Hg     Dg Chest 2 View  09/22/2013   CLINICAL DATA:  Weakness.  Short of breath.  EXAM: CHEST  2 VIEW  COMPARISON:  09/19/2013  FINDINGS: Retrocardiac infiltrate noted on the prior study is less prominent. The cardiac silhouette is moderately enlarged. Normal mediastinal and  hilar contours. Small amount of pleural fluid lies dependently on the lateral view. There are prominent interstitial bronchovascular markings particularly in the lung bases. This is stable. No pneumothorax. Left anterior chest wall single lead pacemaker is well positioned and also stable.  IMPRESSION: Retrocardiac infiltrate described on the prior study is less prominent. There is evidence of small effusions. There is stable interstitial prominence in both lower lung zones best seen on the lateral view. This along with cardiomegaly raises the possibility of mild congestive heart failure.   Electronically Signed   By: Amie Portland M.D.   On: 09/22/2013 18:56   Dg Chest 2 View  09/19/2013   CLINICAL DATA:  SOB  EXAM: CHEST  2 VIEW  COMPARISON:  DG CHEST 1V PORT dated 09/18/2013  FINDINGS: Lobe lung volumes. Cardiac silhouette is enlarged. A left chest wall cardiac pacing unit is appreciated with lead tip projecting in the region of the right ventricle. An area of increased density projects in the retrocardiac region appreciated primarily on the lateral view. There is mild prominence of the interstitial markings and mild peribronchial cuffing. Osseous structures unremarkable.  IMPRESSION: Retrocardiac infiltrate versus atelectasis. Repeat PA and lateral view surveillance evaluation recommended status post appropriate therapeutic regimen. Findings which may reflect a component of pulmonary vascular congestion.   Electronically Signed   By: Salome Holmes M.D.   On: 09/19/2013 14:26   Ct Angio Chest W/cm &/or Wo Cm  09/18/2013   CLINICAL DATA:  73 year old male with chest pain, nausea, vomiting and abdominal/pelvic pain.  EXAM: CT ANGIOGRAPHY CHEST  CT ABDOMEN AND PELVIS WITH CONTRAST  TECHNIQUE: Multidetector CT imaging of the chest was performed using the standard protocol during bolus administration of intravenous contrast. Multiplanar CT image reconstructions and MIPs were obtained to evaluate the vascular  anatomy. Multidetector CT imaging of the abdomen and pelvis was performed using the standard protocol during bolus administration of intravenous contrast.  CONTRAST:  OMNIPAQUE IOHEXOL 350 MG/ML SOLN  COMPARISON:  08/09/2013 and prior abdominal pelvic CTs.  FINDINGS: CTA CHEST FINDINGS  This is a technically adequate study although respiratory motion artifact decreases sensitivity in several areas.  There is no evidence of pulmonary emboli. The central pulmonary arteries are enlarged compatible with pulmonary arterial hypertension.  Cardiomegaly and heavy coronary artery calcifications are present.  There is no evidence of thoracic aortic  aneurysm.  A small left pleural effusion and miniscule right pleural effusion noted.  Mild to moderate bibasilar atelectasis is present.  Mild central ground-glass opacities may represent pulmonary edema.  There is no evidence of consolidation or mass.  No acute or suspicious bony abnormalities are present.  Review of the MIP images confirms the above findings.  CT ABDOMEN and PELVIS FINDINGS  Cirrhosis identified without focal hepatic mass.  Cholelithiasis identified without CT evidence of cholecystitis.  Spleen, pancreas, adrenal glands are unremarkable.  Bilateral renal cortical thinning and renal cysts again noted.  There is no evidence of free fluid, enlarged lymph nodes, biliary dilation or abdominal aortic aneurysm.  The bowel and bladder are unremarkable.  No acute or suspicious bony abnormalities are identified. Degenerative changes with throughout the lumbar spine again noted.  IMPRESSION: No evidence of pulmonary emboli or aortic aneurysm.  Cardiomegaly with small bilateral pleural effusions, bibasilar atelectasis and question interstitial pulmonary edema.  Cirrhosis.  Cholelithiasis.  Coronary artery disease.   Electronically Signed   By: Laveda Abbe M.D.   On: 09/18/2013 00:51   Ct Abdomen Pelvis W Contrast  09/18/2013   CLINICAL DATA:  73 year old male with  chest pain, nausea, vomiting and abdominal/pelvic pain.  EXAM: CT ANGIOGRAPHY CHEST  CT ABDOMEN AND PELVIS WITH CONTRAST  TECHNIQUE: Multidetector CT imaging of the chest was performed using the standard protocol during bolus administration of intravenous contrast. Multiplanar CT image reconstructions and MIPs were obtained to evaluate the vascular anatomy. Multidetector CT imaging of the abdomen and pelvis was performed using the standard protocol during bolus administration of intravenous contrast.  CONTRAST:  OMNIPAQUE IOHEXOL 350 MG/ML SOLN  COMPARISON:  08/09/2013 and prior abdominal pelvic CTs.  FINDINGS: CTA CHEST FINDINGS  This is a technically adequate study although respiratory motion artifact decreases sensitivity in several areas.  There is no evidence of pulmonary emboli. The central pulmonary arteries are enlarged compatible with pulmonary arterial hypertension.  Cardiomegaly and heavy coronary artery calcifications are present.  There is no evidence of thoracic aortic aneurysm.  A small left pleural effusion and miniscule right pleural effusion noted.  Mild to moderate bibasilar atelectasis is present.  Mild central ground-glass opacities may represent pulmonary edema.  There is no evidence of consolidation or mass.  No acute or suspicious bony abnormalities are present.  Review of the MIP images confirms the above findings.  CT ABDOMEN and PELVIS FINDINGS  Cirrhosis identified without focal hepatic mass.  Cholelithiasis identified without CT evidence of cholecystitis.  Spleen, pancreas, adrenal glands are unremarkable.  Bilateral renal cortical thinning and renal cysts again noted.  There is no evidence of free fluid, enlarged lymph nodes, biliary dilation or abdominal aortic aneurysm.  The bowel and bladder are unremarkable.  No acute or suspicious bony abnormalities are identified. Degenerative changes with throughout the lumbar spine again noted.  IMPRESSION: No evidence of pulmonary emboli  or aortic aneurysm.  Cardiomegaly with small bilateral pleural effusions, bibasilar atelectasis and question interstitial pulmonary edema.  Cirrhosis.  Cholelithiasis.  Coronary artery disease.   Electronically Signed   By: Laveda Abbe M.D.   On: 09/18/2013 00:51   Dg Chest Port 1 View  09/18/2013   CLINICAL DATA:  Shortness of breath, history hypertension, ischemic cardiomyopathy, COPD, coronary artery disease, CHF, diabetes, pacemaker  EXAM: PORTABLE CHEST - 1 VIEW  COMPARISON:  Portable exam 0713 hr compared to 09/17/2013  FINDINGS: Left subclavian central pacemaker lead projects over right ventricle.  Enlargement of cardiac silhouette with  pulmonary vascular congestion.  Bibasilar opacities could represent atelectasis or developing consolidation.  No gross pleural effusion or pneumothorax.  Bones demineralized.  IMPRESSION: Enlargement of cardiac silhouette with pulmonary vascular congestion.  Bibasilar opacities question atelectasis versus developing consolidation.   Electronically Signed   By: Ulyses Southward M.D.   On: 09/18/2013 08:31      Micro Results      Recent Results (from the past 240 hour(s))  MRSA PCR SCREENING     Status: None   Collection Time    09/18/13  6:31 AM      Result Value Ref Range Status   MRSA by PCR NEGATIVE  NEGATIVE Final   Comment:            The GeneXpert MRSA Assay (FDA     approved for NASAL specimens     only), is one component of a     comprehensive MRSA colonization     surveillance program. It is not     intended to diagnose MRSA     infection nor to guide or     monitor treatment for     MRSA infections.     History of present illness and  Hospital Course:     Kindly see H&P for history of present illness and admission details, please review complete Labs, Consult reports and Test reports for all details in brief Bruce Taylor, is a 73 y.o. male, patient with history of  CAD status post stenting, atrial fibrillation, pacemaker placement, OSA,  CHF of COPD on home oxygen started experiencing chest pain that woke him up from a nap.     1. Acute respiratory failure - Secondary to acute on chronic nonspecific CHF along with possible early CAP -    Echo done here shows EF of 50-55% with insufficient evaluation for diastolic function. Most likely patient has acute on chronic diastolic CHF, responded well to IV diuretics, cardiology is following, he is clinically feeling better back to baseline he is back to 3 L nasal cannula oxygen which is his home dose oxygen, currently on strict 1.5 L total fluid restriction a daily basis, clinically appears compensated. Resume Lasix in 3-4 days once sodium and creatinine back to baseline.Marland Kitchen     CAP - with possible infiltrate on CT scan of the chest, and became much improved, afebrile, cough and shortness of breath almost completely resolved, stopped Levaquin after the dose on 09/23/2013. He complete.     2. Chronic atrial fibrillation. Goal will be rate control, continue beta blocker, Coumadin continue, monitor INR closely, today's INR is 1.9    3.DM 2 - continue home regimen, I see me control stable here.   CBG (last 3)   Recent Labs  09/23/13 1613 09/23/13 2049 09/24/13 0625  GLUCAP 121* 102* 160*      4. Dyslipidemia. Continue home dose statin.    5. History of COPD. No acute issues. No wheezing on exam, supportive care with oxygen and nebulizer treatments as needed.     6. Falsely elevated point of care troponin. Subsequent lab troponins were negative, no EKG change, no NSTEMI.    7. Low potassium. Replaced and stable.    8. History of depression. On Celexa.    9. Obstipation- resolved after MiraLAX and stool softeners.   10.ARF - hold diuresis, reacting has stabilized and plateaued, hold Lasix for next 3 days, resume once creatinine is 1.2, and sodium around 135    11.Chr tremors -failed outpatient treatment by  neurology, continued outpatient followup as  needed.    12.Hyponatremia- at 1.5 L total fluid and ice restriction on a daily basis, hold Lasix for the next 3 days, repeat BMP in 3 days.       Today   Subjective:   Bruce Taylor today has no headache,no chest abdominal pain,no new weakness tingling or numbness, feels much better.  Objective:   Blood pressure 116/73, pulse 62, temperature 98.3 F (36.8 C), temperature source Oral, resp. rate 18, height 5\' 10"  (1.778 m), weight 105.1 kg (231 lb 11.3 oz), SpO2 94.00%.   Intake/Output Summary (Last 24 hours) at 09/24/13 1053 Last data filed at 09/24/13 0700  Gross per 24 hour  Intake 1280.65 ml  Output   2950 ml  Net -1669.35 ml    Exam Awake Alert, Oriented *3, No new F.N deficits, Normal affect, chronic upper extremity tremors .AT,PERRAL Supple Neck,No JVD, No cervical lymphadenopathy appriciated.  Symmetrical Chest wall movement, Good air movement bilaterally, CTAB RRR,No Gallops,Rubs or new Murmurs, No Parasternal Heave +ve B.Sounds, Abd Soft, Non tender, No organomegaly appriciated, No rebound -guarding or rigidity. No Cyanosis, Clubbing or edema, No new Rash or bruise  Data Review   CBC w Diff: Lab Results  Component Value Date   WBC 8.1 09/24/2013   HGB 12.1* 09/24/2013   HCT 35.9* 09/24/2013   PLT 154 09/24/2013   LYMPHOPCT 5* 09/18/2013   MONOPCT 1* 09/18/2013   EOSPCT 0 09/18/2013   BASOPCT 0 09/18/2013    CMP: Lab Results  Component Value Date   NA 131* 09/24/2013   K 4.2 09/24/2013   CL 93* 09/24/2013   CO2 25 09/24/2013   BUN 20 09/24/2013   CREATININE 1.40* 09/24/2013   PROT 7.3 09/19/2013   ALBUMIN 3.1* 09/19/2013   BILITOT 0.4 09/19/2013   ALKPHOS 82 09/19/2013   AST 17 09/19/2013   ALT 8 09/19/2013  .   Total Time in preparing paper work, data evaluation and todays exam - 35 minutes  Leroy SeaSINGH,PRASHANT K M.D on 09/24/2013 at 10:53 AM  [facility] Office  [phone]

## 2013-09-24 NOTE — Progress Notes (Signed)
ANTICOAGULATION CONSULT NOTE - Follow Up Consult  Pharmacy Consult for Coumadin Indication: atrial fibrillation  Allergies  Allergen Reactions  . Penicillins Anaphylaxis and Swelling    "swell up all over my body; throat mainly, I think" (07/13/2012)  . Chlorhexidine Gluconate Itching    Chg cloth "don't remember how bad it was" (07/13/2012)    Patient Measurements: Height: 5\' 10"  (177.8 cm) Weight: 231 lb 11.3 oz (105.1 kg) IBW/kg (Calculated) : 73 Heparin Dosing Weight: ~100kg  Vital Signs: Temp: 98.3 F (36.8 C) (03/02 0534) Temp src: Oral (03/02 0534) BP: 116/73 mmHg (03/02 0534) Pulse Rate: 62 (03/02 0534)  Labs:  Recent Labs  09/22/13 0326 09/23/13 0341 09/24/13 0349  HGB  --   --  12.1*  HCT  --   --  35.9*  PLT  --   --  154  LABPROT 17.9* 20.2* 21.5*  INR 1.52* 1.78* 1.93*  HEPARINUNFRC 0.54 0.47 <0.10*  CREATININE 1.41* 1.42* 1.40*    Estimated Creatinine Clearance: 56.2 ml/min (by C-G formula based on Cr of 1.4).   Medications:  Heparin has been dced  Assessment: 72yom on coumadin for afib. His heparin has been dced since INR is almost therapeutic.  No bleeding reported. Will try to get him back on his home dose of 10mg  daily except 12mg  Mon/Fri.  Goal of Therapy:  INR 2-3 Monitor platelets by anticoagulation protocol: Yes   Plan:   Coumadin 10mg  PO qday except  12mg  MF Cont daily INR

## 2013-09-25 ENCOUNTER — Telehealth: Payer: Self-pay | Admitting: *Deleted

## 2013-09-25 NOTE — Telephone Encounter (Signed)
Returned signed coumadin order that was verbally given by Baxter HireKristen on 08/27/13.

## 2013-09-28 ENCOUNTER — Telehealth: Payer: Self-pay | Admitting: Pulmonary Disease

## 2013-10-24 NOTE — Telephone Encounter (Signed)
[facility].

## 2013-10-24 DEATH — deceased

## 2013-12-11 ENCOUNTER — Ambulatory Visit: Payer: Medicare Other | Admitting: Pulmonary Disease

## 2014-02-06 ENCOUNTER — Telehealth: Payer: Self-pay | Admitting: Cardiovascular Disease

## 2014-02-06 NOTE — Telephone Encounter (Signed)
[facility] called asking for records concerning an A1C, I referred her to His pcp and the hospital

## 2014-02-19 ENCOUNTER — Encounter: Payer: Self-pay | Admitting: *Deleted

## 2014-03-07 ENCOUNTER — Encounter: Payer: Self-pay | Admitting: Internal Medicine

## 2014-04-30 ENCOUNTER — Encounter: Payer: Self-pay | Admitting: *Deleted

## 2014-07-03 ENCOUNTER — Encounter (HOSPITAL_COMMUNITY): Payer: Self-pay | Admitting: Cardiovascular Disease

## 2014-08-20 ENCOUNTER — Ambulatory Visit: Payer: Self-pay | Admitting: Pharmacist Clinician (PhC)/ Clinical Pharmacy Specialist

## 2014-12-06 IMAGING — CR DG CHEST 2V
3 series · 3 of 3 positions shown · non-contrast
Comparison: 06/23/2011.

CLINICAL DATA: Cough and congestion.

CHEST - 2 VIEW

[w chest lat (1 of 2)]
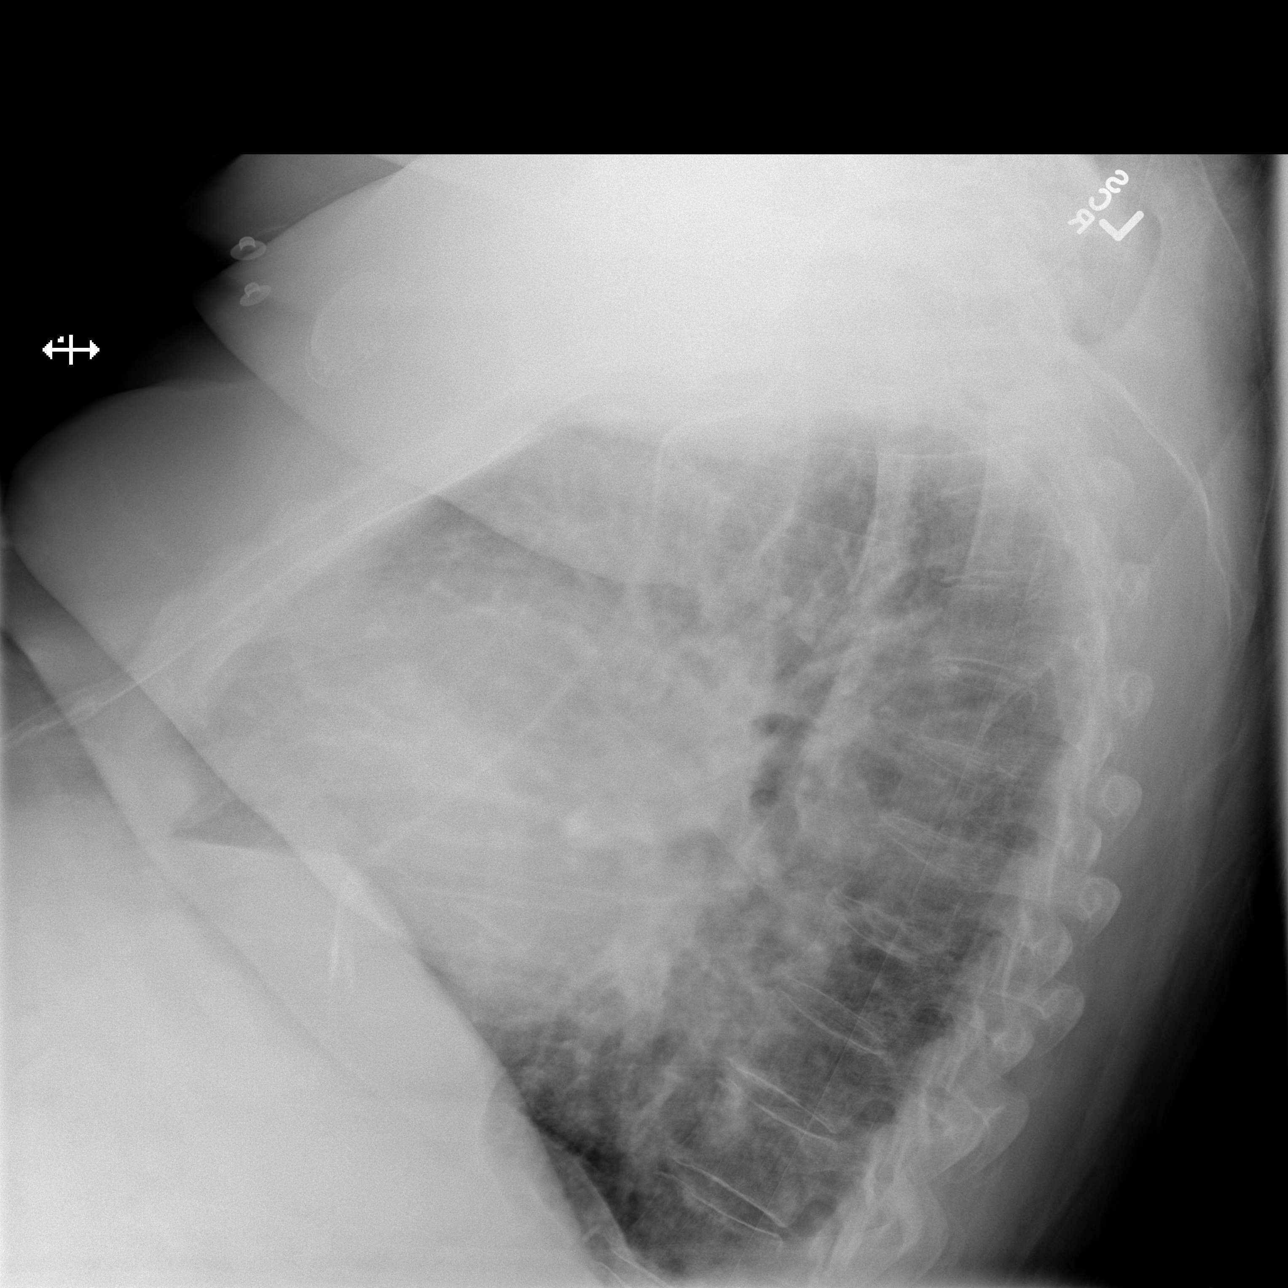

[w chest lat (2 of 2)]
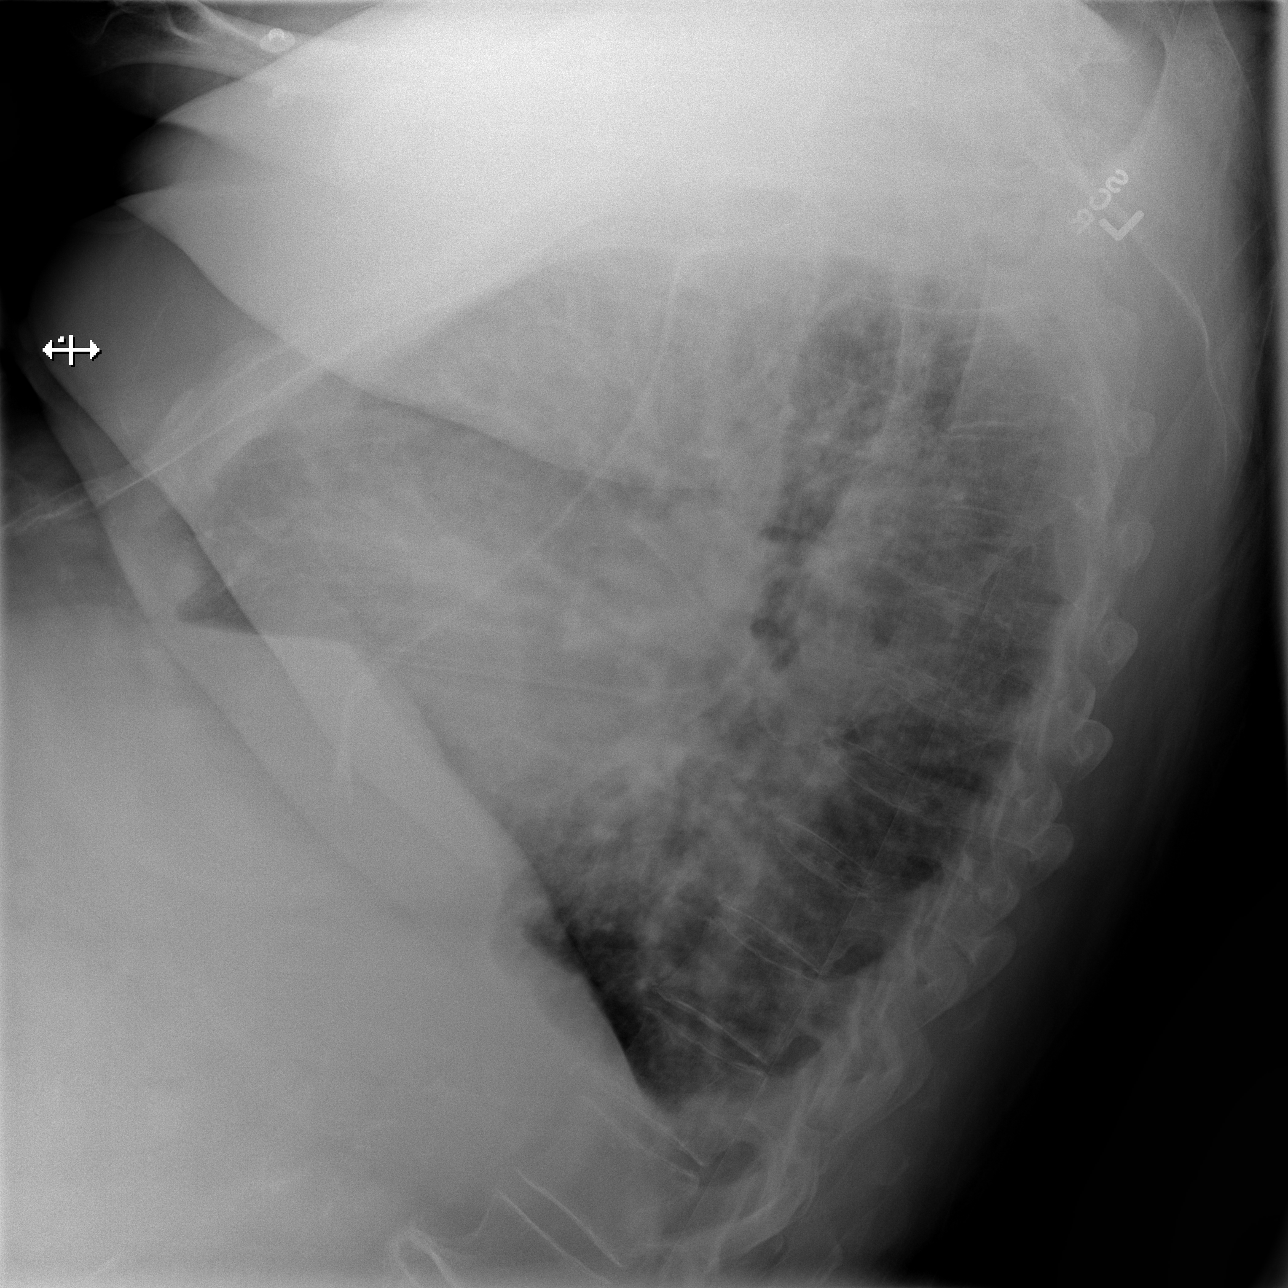

[x chest ap]
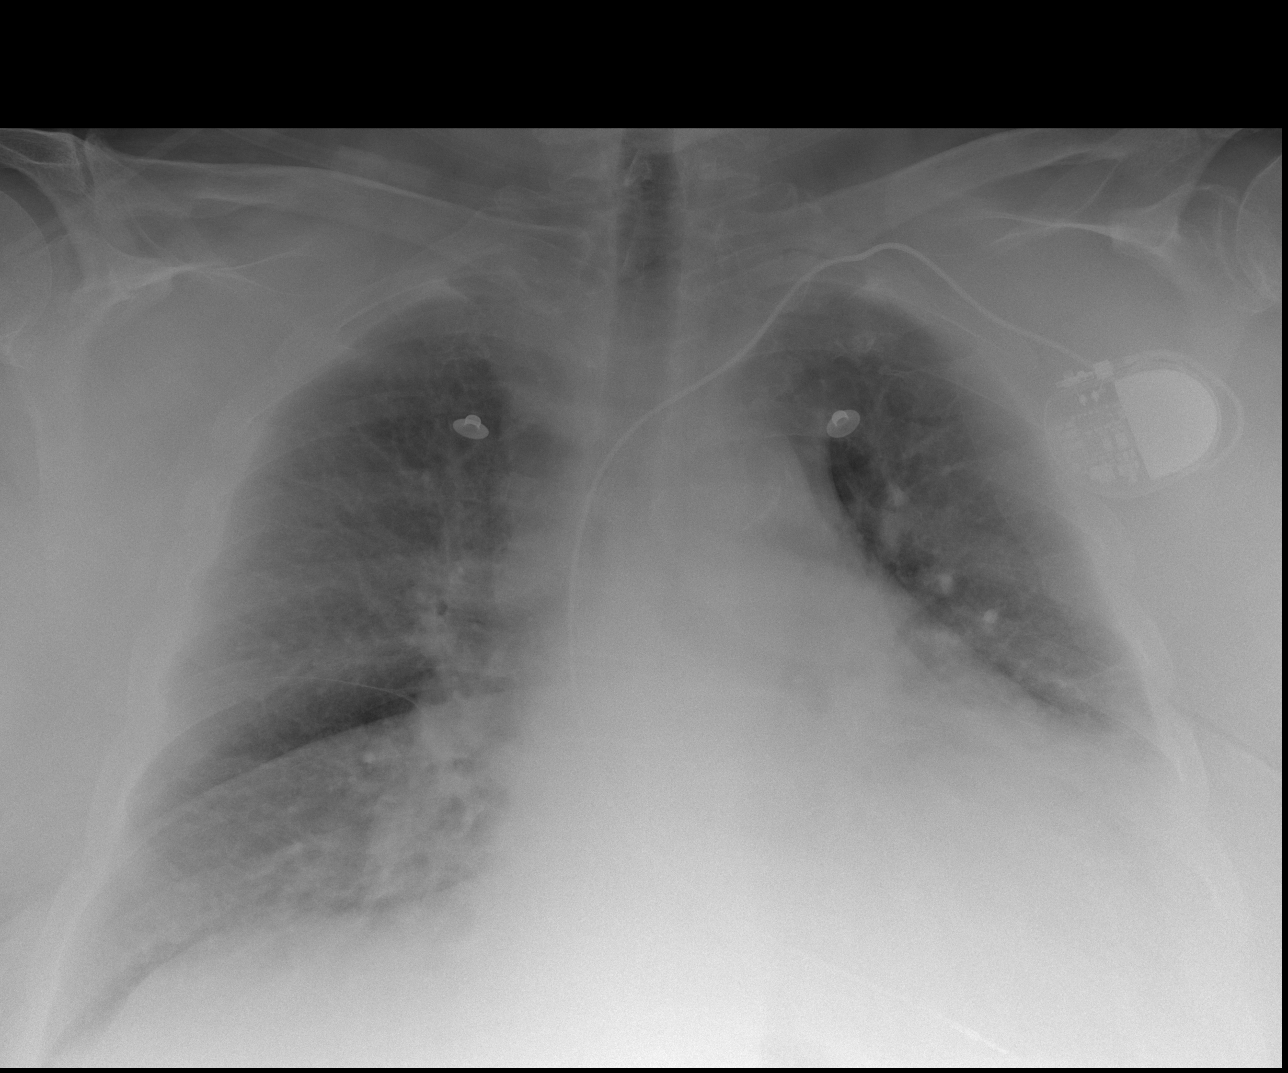

[3 of 3 positions shown; findings below may reference images not displayed]

FINDINGS: The heart is enlarged but stable.  The mediastinal and
hilar contours are prominent but unchanged.  There is central
vascular congestion and probable interstitial pulmonary edema.  No
obvious pleural effusion on the lateral film.
IMPRESSION: Cardiac enlargement with vascular congestion and probable mild
pulmonary edema.  No definite infiltrates.

## 2016-01-02 IMAGING — CR DG ABDOMEN ACUTE W/ 1V CHEST
4 series · 4 of 4 positions shown · non-contrast
Comparison: April 09, 2013.

CLINICAL DATA: Abdominal pain.

EXAM:
ACUTE ABDOMEN SERIES (ABDOMEN 2 VIEW & CHEST 1 VIEW)

[x chest ap]
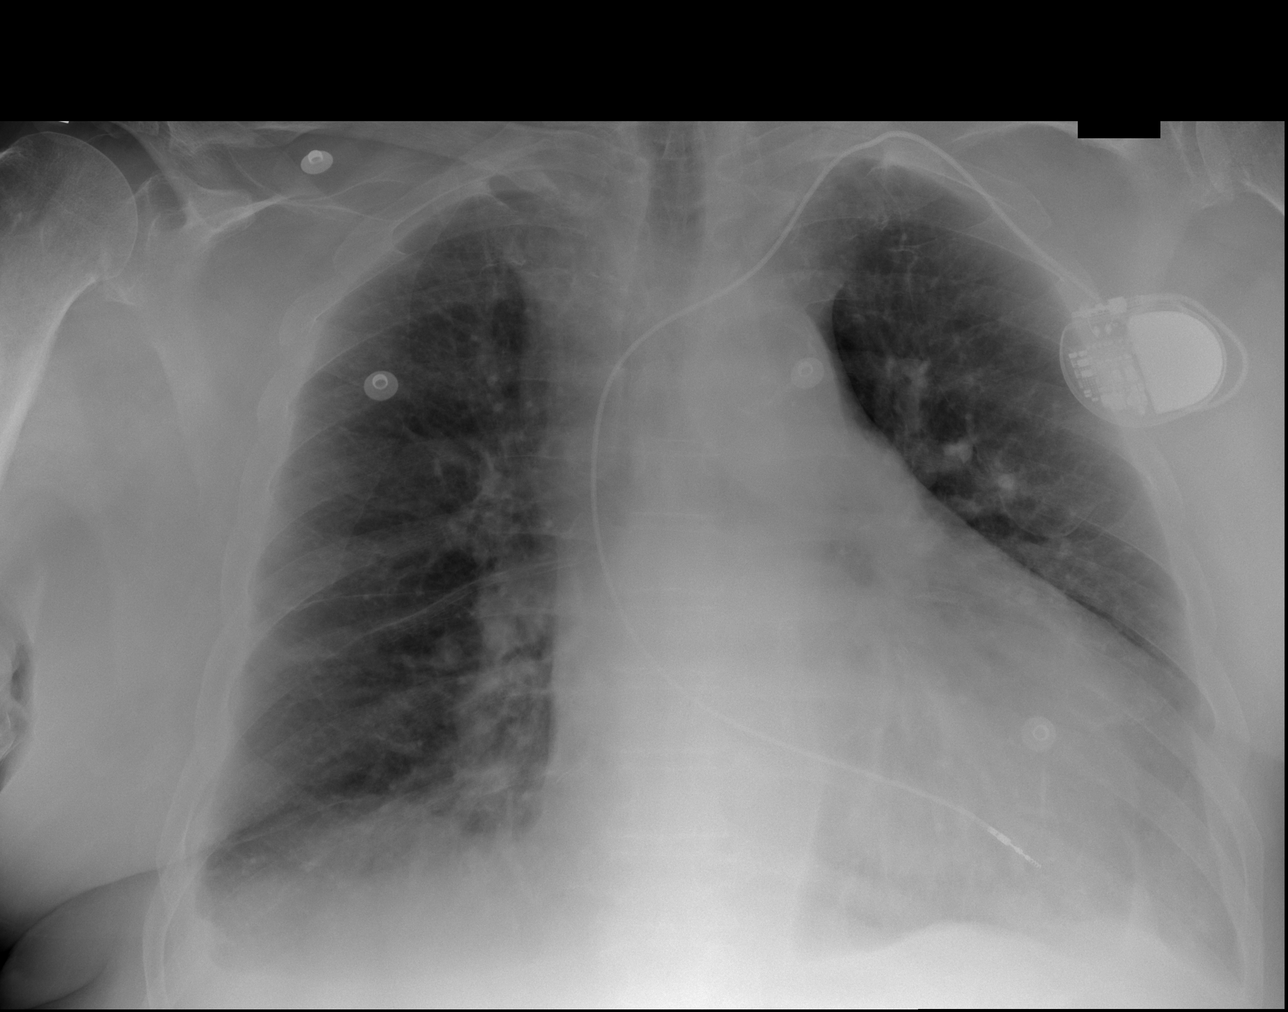

[w abdomen decub]
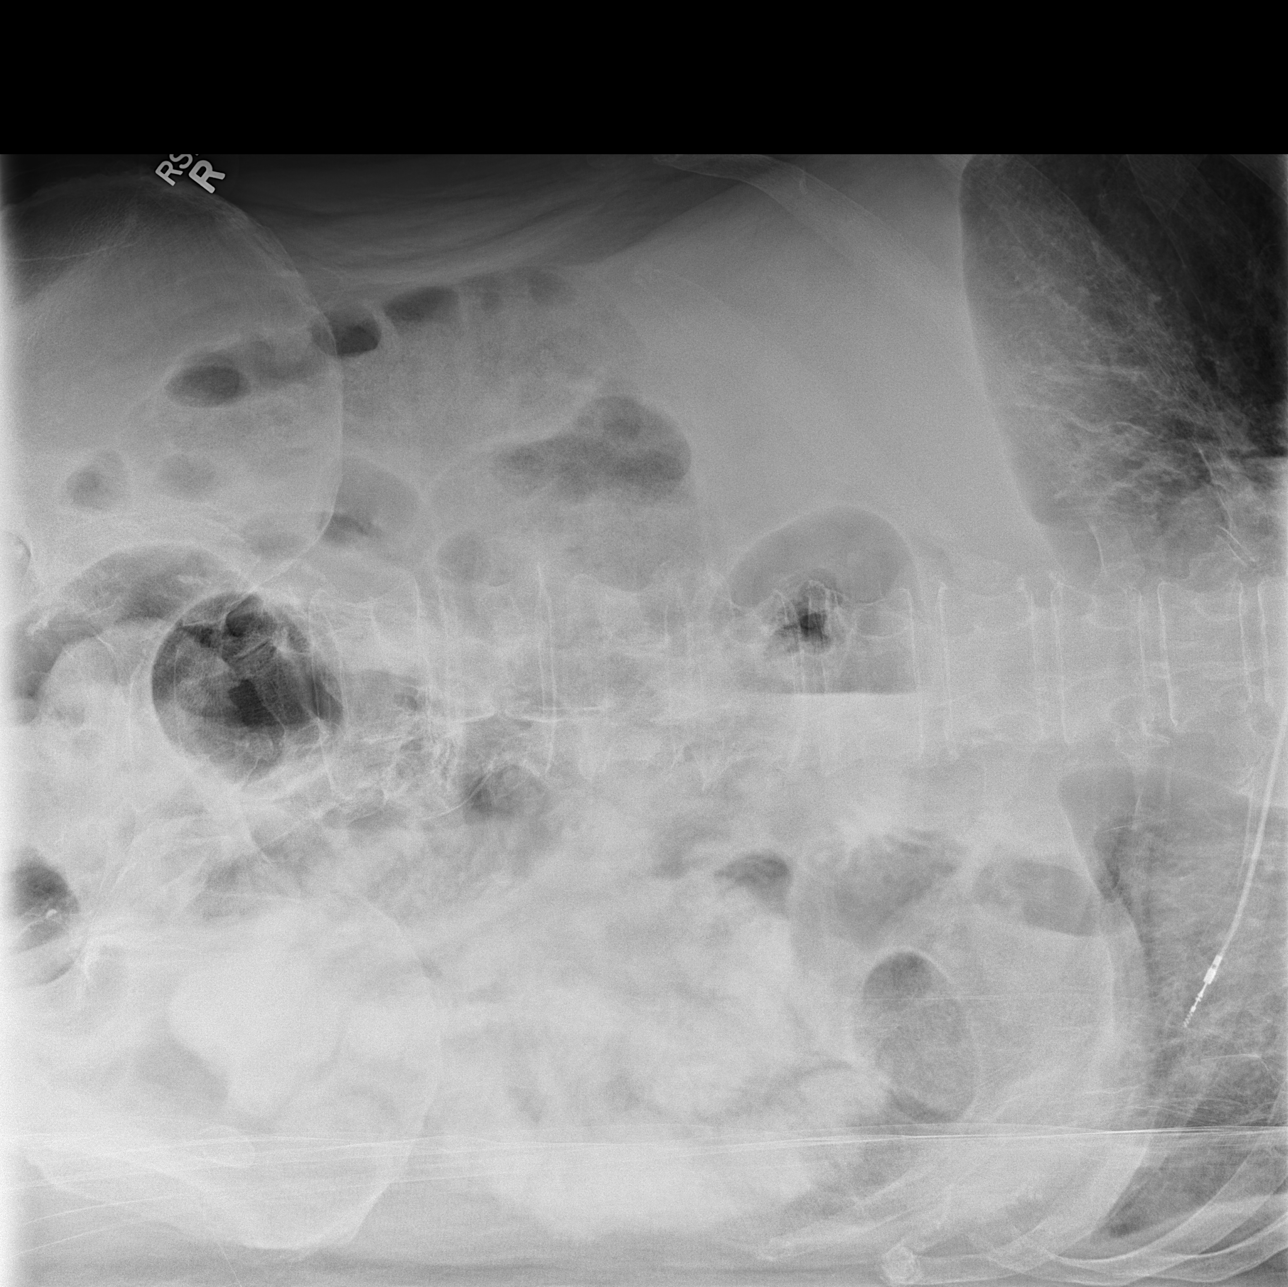

[t abdomen supine (1 of 2)]
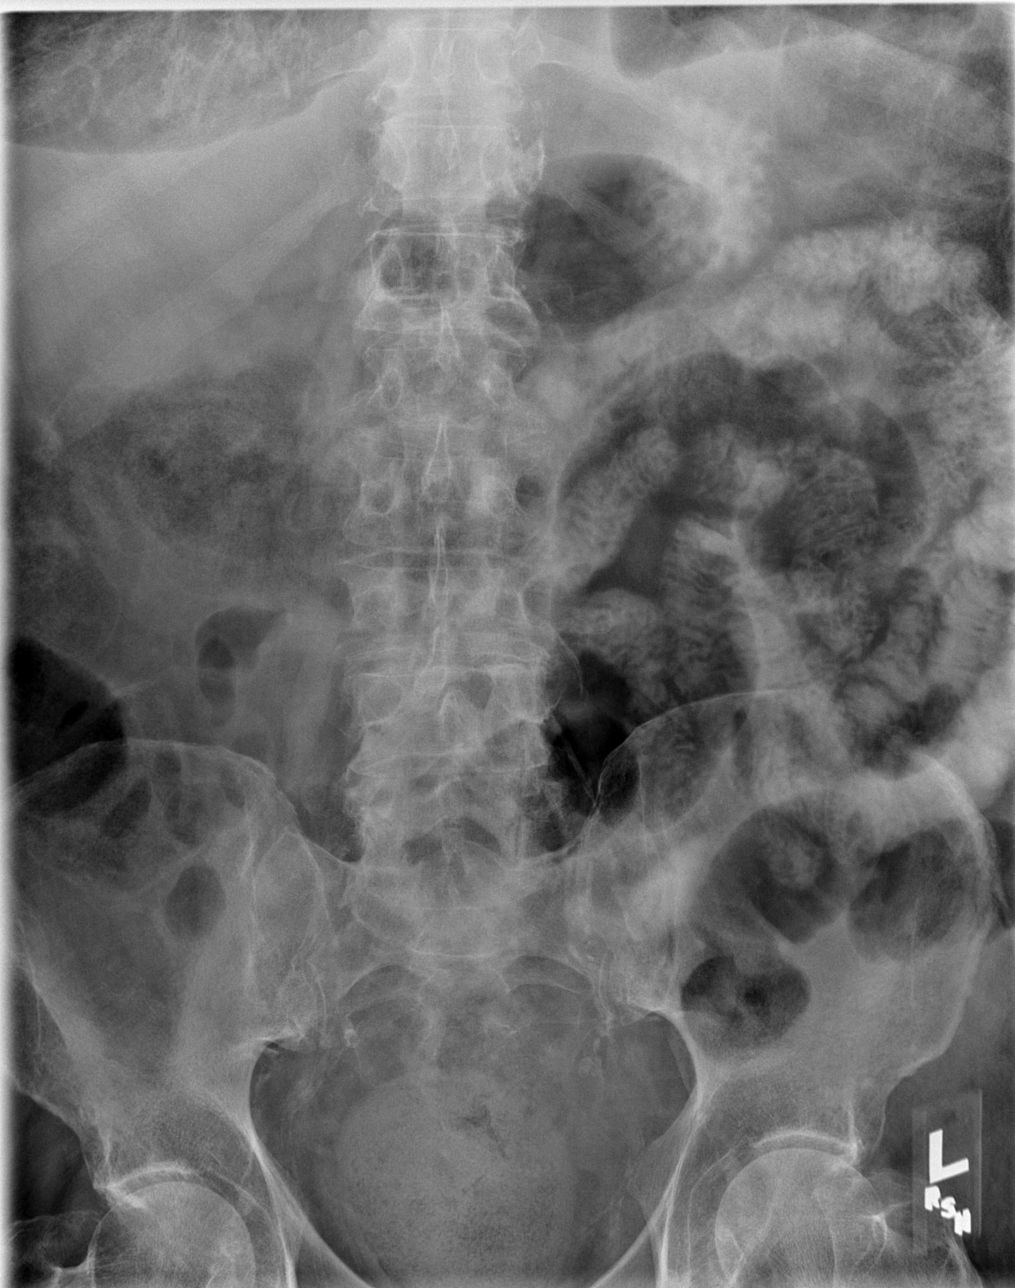

[t abdomen supine (2 of 2)]
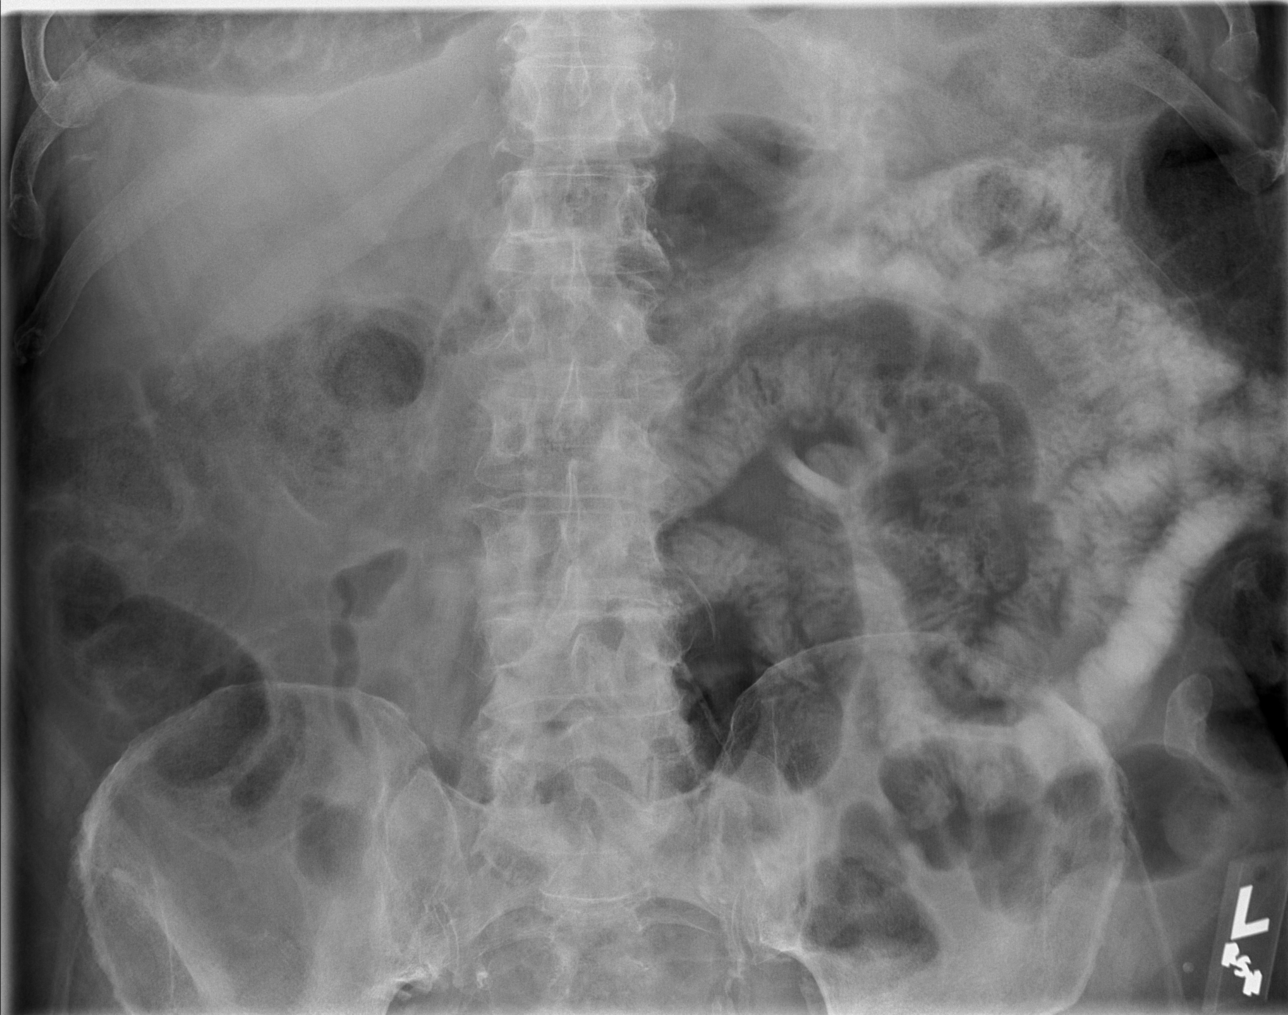

[4 of 4 positions shown; findings below may reference images not displayed]

FINDINGS: There is no evidence of dilated bowel loops or free intraperitoneal
air. Stool is noted in the rectum. No radiopaque calculi or other
significant radiographic abnormality is seen. Mild cardiomegaly is
noted. Left-sided pacemaker is noted. No acute pulmonary disease is
noted. Both lungs are clear. Degenerative changes of lumbar spine
are noted.
IMPRESSION: No evidence of bowel obstruction or ileus. No acute cardiopulmonary
disease.
